# Patient Record
Sex: Female | Born: 1945
Health system: Southern US, Community
[De-identification: ages and names within clinical notes are randomized; demographics above are authoritative.]

## PROBLEM LIST (undated history)

## (undated) DIAGNOSIS — N200 Calculus of kidney: Secondary | ICD-10-CM

## (undated) DIAGNOSIS — IMO0001 Reserved for inherently not codable concepts without codable children: Secondary | ICD-10-CM

## (undated) DIAGNOSIS — G4730 Sleep apnea, unspecified: Secondary | ICD-10-CM

## (undated) DIAGNOSIS — E039 Hypothyroidism, unspecified: Secondary | ICD-10-CM

## (undated) DIAGNOSIS — I2510 Atherosclerotic heart disease of native coronary artery without angina pectoris: Secondary | ICD-10-CM

## (undated) DIAGNOSIS — E785 Hyperlipidemia, unspecified: Secondary | ICD-10-CM

## (undated) DIAGNOSIS — Z87442 Personal history of urinary calculi: Secondary | ICD-10-CM

## (undated) DIAGNOSIS — I10 Essential (primary) hypertension: Secondary | ICD-10-CM

## (undated) DIAGNOSIS — F32A Depression, unspecified: Secondary | ICD-10-CM

## (undated) DIAGNOSIS — G459 Transient cerebral ischemic attack, unspecified: Secondary | ICD-10-CM

## (undated) DIAGNOSIS — F329 Major depressive disorder, single episode, unspecified: Secondary | ICD-10-CM

## (undated) DIAGNOSIS — I209 Angina pectoris, unspecified: Secondary | ICD-10-CM

## (undated) HISTORY — DX: Reserved for inherently not codable concepts without codable children: IMO0001

## (undated) HISTORY — DX: Depression, unspecified: F32.A

## (undated) HISTORY — PX: CORONARY ANGIOPLASTY WITH STENT PLACEMENT: SHX49

## (undated) HISTORY — PX: KIDNEY STONE SURGERY: SHX686

## (undated) HISTORY — DX: Transient cerebral ischemic attack, unspecified: G45.9

## (undated) HISTORY — PX: TONSILLECTOMY: SUR1361

## (undated) HISTORY — PX: LAPAROSCOPIC CHOLECYSTECTOMY: SUR755

## (undated) HISTORY — DX: Hyperlipidemia, unspecified: E78.5

## (undated) HISTORY — PX: DILATION AND CURETTAGE OF UTERUS: SHX78

## (undated) HISTORY — DX: Essential (primary) hypertension: I10

## (undated) HISTORY — DX: Major depressive disorder, single episode, unspecified: F32.9

## (undated) HISTORY — DX: Atherosclerotic heart disease of native coronary artery without angina pectoris: I25.10

## (undated) HISTORY — DX: Hypothyroidism, unspecified: E03.9

## (undated) HISTORY — PX: APPENDECTOMY: SHX54

## (undated) HISTORY — PX: TUBAL LIGATION: SHX77

## (undated) HISTORY — PX: ABDOMINAL HYSTERECTOMY: SHX81

## (undated) HISTORY — PX: CATARACT EXTRACTION, BILATERAL: SHX1313

## (undated) HISTORY — PX: BLEPHAROPLASTY: SUR158

---

## 2001-08-24 HISTORY — PX: CORONARY ARTERY BYPASS GRAFT: SHX141

## 2014-09-04 DIAGNOSIS — M2550 Pain in unspecified joint: Secondary | ICD-10-CM | POA: Diagnosis not present

## 2014-09-04 DIAGNOSIS — R5382 Chronic fatigue, unspecified: Secondary | ICD-10-CM | POA: Diagnosis not present

## 2014-09-04 DIAGNOSIS — I119 Hypertensive heart disease without heart failure: Secondary | ICD-10-CM | POA: Diagnosis not present

## 2014-09-04 DIAGNOSIS — E784 Other hyperlipidemia: Secondary | ICD-10-CM | POA: Diagnosis not present

## 2014-09-11 DIAGNOSIS — D510 Vitamin B12 deficiency anemia due to intrinsic factor deficiency: Secondary | ICD-10-CM | POA: Diagnosis not present

## 2014-09-11 DIAGNOSIS — R5383 Other fatigue: Secondary | ICD-10-CM | POA: Diagnosis not present

## 2014-09-11 DIAGNOSIS — I110 Hypertensive heart disease with heart failure: Secondary | ICD-10-CM | POA: Diagnosis not present

## 2014-09-11 DIAGNOSIS — E784 Other hyperlipidemia: Secondary | ICD-10-CM | POA: Diagnosis not present

## 2014-09-19 DIAGNOSIS — Z8489 Family history of other specified conditions: Secondary | ICD-10-CM | POA: Diagnosis not present

## 2014-09-19 DIAGNOSIS — I358 Other nonrheumatic aortic valve disorders: Secondary | ICD-10-CM | POA: Diagnosis not present

## 2014-09-19 DIAGNOSIS — Z9862 Peripheral vascular angioplasty status: Secondary | ICD-10-CM | POA: Diagnosis not present

## 2014-09-19 DIAGNOSIS — R9439 Abnormal result of other cardiovascular function study: Secondary | ICD-10-CM | POA: Diagnosis not present

## 2014-09-19 DIAGNOSIS — R0602 Shortness of breath: Secondary | ICD-10-CM | POA: Diagnosis not present

## 2014-09-19 DIAGNOSIS — I2510 Atherosclerotic heart disease of native coronary artery without angina pectoris: Secondary | ICD-10-CM | POA: Diagnosis not present

## 2014-11-29 DIAGNOSIS — Z79899 Other long term (current) drug therapy: Secondary | ICD-10-CM | POA: Diagnosis not present

## 2014-11-29 DIAGNOSIS — I209 Angina pectoris, unspecified: Secondary | ICD-10-CM | POA: Diagnosis not present

## 2014-11-29 DIAGNOSIS — Z8673 Personal history of transient ischemic attack (TIA), and cerebral infarction without residual deficits: Secondary | ICD-10-CM | POA: Diagnosis not present

## 2014-11-29 DIAGNOSIS — Z951 Presence of aortocoronary bypass graft: Secondary | ICD-10-CM | POA: Diagnosis not present

## 2014-11-29 DIAGNOSIS — I10 Essential (primary) hypertension: Secondary | ICD-10-CM | POA: Diagnosis not present

## 2014-11-29 DIAGNOSIS — J069 Acute upper respiratory infection, unspecified: Secondary | ICD-10-CM | POA: Diagnosis not present

## 2014-11-29 DIAGNOSIS — I200 Unstable angina: Secondary | ICD-10-CM | POA: Diagnosis not present

## 2014-11-29 DIAGNOSIS — E039 Hypothyroidism, unspecified: Secondary | ICD-10-CM | POA: Diagnosis not present

## 2014-11-29 DIAGNOSIS — F329 Major depressive disorder, single episode, unspecified: Secondary | ICD-10-CM | POA: Diagnosis not present

## 2014-11-29 DIAGNOSIS — I2582 Chronic total occlusion of coronary artery: Secondary | ICD-10-CM | POA: Diagnosis not present

## 2014-11-29 DIAGNOSIS — T82857A Stenosis of cardiac prosthetic devices, implants and grafts, initial encounter: Secondary | ICD-10-CM | POA: Diagnosis not present

## 2014-11-29 DIAGNOSIS — R05 Cough: Secondary | ICD-10-CM | POA: Diagnosis not present

## 2014-11-29 DIAGNOSIS — N3281 Overactive bladder: Secondary | ICD-10-CM | POA: Diagnosis not present

## 2014-11-29 DIAGNOSIS — Z7902 Long term (current) use of antithrombotics/antiplatelets: Secondary | ICD-10-CM | POA: Diagnosis not present

## 2014-11-29 DIAGNOSIS — E782 Mixed hyperlipidemia: Secondary | ICD-10-CM | POA: Diagnosis not present

## 2014-11-29 DIAGNOSIS — I25110 Atherosclerotic heart disease of native coronary artery with unstable angina pectoris: Secondary | ICD-10-CM | POA: Diagnosis not present

## 2014-11-29 DIAGNOSIS — B9789 Other viral agents as the cause of diseases classified elsewhere: Secondary | ICD-10-CM | POA: Diagnosis not present

## 2014-11-29 DIAGNOSIS — Z7982 Long term (current) use of aspirin: Secondary | ICD-10-CM | POA: Diagnosis not present

## 2014-11-29 DIAGNOSIS — F419 Anxiety disorder, unspecified: Secondary | ICD-10-CM | POA: Diagnosis not present

## 2014-11-29 DIAGNOSIS — R079 Chest pain, unspecified: Secondary | ICD-10-CM | POA: Diagnosis not present

## 2014-11-29 DIAGNOSIS — R531 Weakness: Secondary | ICD-10-CM | POA: Diagnosis not present

## 2015-01-04 DIAGNOSIS — I2510 Atherosclerotic heart disease of native coronary artery without angina pectoris: Secondary | ICD-10-CM | POA: Diagnosis not present

## 2015-01-04 DIAGNOSIS — N390 Urinary tract infection, site not specified: Secondary | ICD-10-CM | POA: Diagnosis not present

## 2015-01-31 DIAGNOSIS — R0602 Shortness of breath: Secondary | ICD-10-CM | POA: Diagnosis not present

## 2015-01-31 DIAGNOSIS — Z8489 Family history of other specified conditions: Secondary | ICD-10-CM | POA: Diagnosis not present

## 2015-01-31 DIAGNOSIS — Z9862 Peripheral vascular angioplasty status: Secondary | ICD-10-CM | POA: Diagnosis not present

## 2015-01-31 DIAGNOSIS — R072 Precordial pain: Secondary | ICD-10-CM | POA: Diagnosis not present

## 2015-01-31 DIAGNOSIS — I2510 Atherosclerotic heart disease of native coronary artery without angina pectoris: Secondary | ICD-10-CM | POA: Diagnosis not present

## 2015-01-31 DIAGNOSIS — E782 Mixed hyperlipidemia: Secondary | ICD-10-CM | POA: Diagnosis not present

## 2015-01-31 DIAGNOSIS — I10 Essential (primary) hypertension: Secondary | ICD-10-CM | POA: Diagnosis not present

## 2015-02-01 ENCOUNTER — Other Ambulatory Visit (HOSPITAL_COMMUNITY): Payer: Self-pay | Admitting: Family Medicine

## 2015-02-01 DIAGNOSIS — Z1231 Encounter for screening mammogram for malignant neoplasm of breast: Secondary | ICD-10-CM

## 2015-02-01 DIAGNOSIS — R5382 Chronic fatigue, unspecified: Secondary | ICD-10-CM | POA: Diagnosis not present

## 2015-02-01 DIAGNOSIS — E662 Morbid (severe) obesity with alveolar hypoventilation: Secondary | ICD-10-CM | POA: Diagnosis not present

## 2015-02-01 DIAGNOSIS — I119 Hypertensive heart disease without heart failure: Secondary | ICD-10-CM | POA: Diagnosis not present

## 2015-02-01 DIAGNOSIS — E784 Other hyperlipidemia: Secondary | ICD-10-CM | POA: Diagnosis not present

## 2015-02-05 DIAGNOSIS — R350 Frequency of micturition: Secondary | ICD-10-CM | POA: Diagnosis not present

## 2015-02-05 DIAGNOSIS — E784 Other hyperlipidemia: Secondary | ICD-10-CM | POA: Diagnosis not present

## 2015-02-05 DIAGNOSIS — E1165 Type 2 diabetes mellitus with hyperglycemia: Secondary | ICD-10-CM | POA: Diagnosis not present

## 2015-02-05 DIAGNOSIS — R5383 Other fatigue: Secondary | ICD-10-CM | POA: Diagnosis not present

## 2015-02-11 ENCOUNTER — Ambulatory Visit (HOSPITAL_COMMUNITY)
Admission: RE | Admit: 2015-02-11 | Discharge: 2015-02-11 | Disposition: A | Discharge status: Home / Self Care | Payer: Medicare Other | Source: Ambulatory Visit | Attending: Family Medicine | Admitting: Family Medicine

## 2015-02-11 DIAGNOSIS — Z1231 Encounter for screening mammogram for malignant neoplasm of breast: Secondary | ICD-10-CM | POA: Diagnosis not present

## 2015-05-13 DIAGNOSIS — E785 Hyperlipidemia, unspecified: Secondary | ICD-10-CM | POA: Diagnosis not present

## 2015-05-13 DIAGNOSIS — R079 Chest pain, unspecified: Secondary | ICD-10-CM | POA: Diagnosis not present

## 2015-05-13 DIAGNOSIS — R5383 Other fatigue: Secondary | ICD-10-CM | POA: Diagnosis not present

## 2015-05-13 DIAGNOSIS — I2510 Atherosclerotic heart disease of native coronary artery without angina pectoris: Secondary | ICD-10-CM | POA: Diagnosis not present

## 2015-05-24 DIAGNOSIS — R079 Chest pain, unspecified: Secondary | ICD-10-CM | POA: Diagnosis not present

## 2015-05-24 DIAGNOSIS — R931 Abnormal findings on diagnostic imaging of heart and coronary circulation: Secondary | ICD-10-CM | POA: Diagnosis not present

## 2015-08-08 ENCOUNTER — Ambulatory Visit: Payer: Self-pay | Admitting: Family Medicine

## 2015-09-03 ENCOUNTER — Ambulatory Visit (INDEPENDENT_AMBULATORY_CARE_PROVIDER_SITE_OTHER): Payer: PPO | Admitting: Family Medicine

## 2015-09-03 ENCOUNTER — Encounter: Payer: Self-pay | Admitting: Family Medicine

## 2015-09-03 VITALS — BP 110/78 | Ht 62.0 in | Wt 214.6 lb

## 2015-09-03 DIAGNOSIS — E785 Hyperlipidemia, unspecified: Secondary | ICD-10-CM | POA: Insufficient documentation

## 2015-09-03 DIAGNOSIS — I25708 Atherosclerosis of coronary artery bypass graft(s), unspecified, with other forms of angina pectoris: Secondary | ICD-10-CM

## 2015-09-03 DIAGNOSIS — E038 Other specified hypothyroidism: Secondary | ICD-10-CM | POA: Diagnosis not present

## 2015-09-03 DIAGNOSIS — I25709 Atherosclerosis of coronary artery bypass graft(s), unspecified, with unspecified angina pectoris: Secondary | ICD-10-CM | POA: Insufficient documentation

## 2015-09-03 DIAGNOSIS — F32A Depression, unspecified: Secondary | ICD-10-CM

## 2015-09-03 DIAGNOSIS — I10 Essential (primary) hypertension: Secondary | ICD-10-CM | POA: Diagnosis not present

## 2015-09-03 DIAGNOSIS — I25812 Atherosclerosis of bypass graft of coronary artery of transplanted heart without angina pectoris: Secondary | ICD-10-CM

## 2015-09-03 DIAGNOSIS — E039 Hypothyroidism, unspecified: Secondary | ICD-10-CM | POA: Insufficient documentation

## 2015-09-03 DIAGNOSIS — I25810 Atherosclerosis of coronary artery bypass graft(s) without angina pectoris: Secondary | ICD-10-CM | POA: Insufficient documentation

## 2015-09-03 DIAGNOSIS — F329 Major depressive disorder, single episode, unspecified: Secondary | ICD-10-CM

## 2015-09-03 MED ORDER — LEVOTHYROXINE SODIUM 75 MCG PO TABS: 75.0000 ug | ORAL_TABLET | Freq: Every day | ORAL | Status: DC

## 2015-09-03 MED ORDER — SOLIFENACIN SUCCINATE 10 MG PO TABS: ORAL_TABLET | ORAL | Status: DC

## 2015-09-03 MED ORDER — PAROXETINE HCL 20 MG PO TABS: 20.0000 mg | ORAL_TABLET | Freq: Every day | ORAL | Status: DC

## 2015-09-03 NOTE — Progress Notes (Signed)
   Subjective:    Patient ID: Lacey Bailey, female    DOB: 1946-05-17, 70 y.o.   MRN: [id number]  HPI  Patient arrives to get established. Patient has a long standing heart history that includes bypass surgery and recent stent placement. Patient follows up with cardiologist on a regular basis. [facility]     Patient states she is having issues with bladder incontinence despite being on oxybutynin 5 mg 2 tablets BID and wonders about other options. Not effective anymore, mixed incontinence disorder with stress and urgency features. Dry mouth on medicine  Quadruple bupass in 2003  o thyroid med, maintained by pt faithfully.thyr stable withint he last yr No symptoms of high or low thyroid.   Also on paxil, not able to get off,hx of depresion, stable on meds, fam hx of depr, fa commited suicide. Definitely wants to stay on the medication.  Walks but not as much as she hoped  Colonoscopy tow yrs ago  mammo here.   Due to see card soon       Review of Systems no headache no chest pain no back pain no abdominal pain no change in bowel habits    Objective:   Physical Exam  alert vitals stable. Blood pressure good on repeat HEENT normal lungs clear heart rare rhythm ankles without edema mental status appropriate        Assessment & Plan:   impression #1 hypothyroidism good control per patient await records maintain same medicine for now discussed #2 depression clinically stable. Definitely wishes to [facility] meds long-term discussed #3 mixed incontinence some side effects with oxybutynin and minimal help will change medicine rationale discussed #4 coronary artery disease #5 hypertension #6 hyperlipidemia plan medications refilled. Old records. Diet exercise discussed. Recheck in 6 months. Screening well woman exams [facility]

## 2015-09-24 DIAGNOSIS — I2510 Atherosclerotic heart disease of native coronary artery without angina pectoris: Secondary | ICD-10-CM | POA: Diagnosis not present

## 2015-09-24 DIAGNOSIS — I10 Essential (primary) hypertension: Secondary | ICD-10-CM | POA: Diagnosis not present

## 2015-09-27 ENCOUNTER — Telehealth: Payer: Self-pay | Admitting: Family Medicine

## 2015-09-27 DIAGNOSIS — R32 Unspecified urinary incontinence: Secondary | ICD-10-CM

## 2015-09-27 NOTE — Telephone Encounter (Signed)
Patient has also been on oxybutin 5 mg 2 tablets twice a day

## 2015-09-27 NOTE — Telephone Encounter (Signed)
Failure on two meds, likely time to see urologis, would rec [facility]

## 2015-09-27 NOTE — Telephone Encounter (Addendum)
Patient advised that with a failure on two meds, likely time to see urologist, Dr Brett Canales would recommend [facility]. Patient verbalized understanding and would like appt in [place]. Referral placed in EPIC.

## 2015-09-27 NOTE — Telephone Encounter (Signed)
Pt called stating that the Vesicare that she was recently prescribed does not work at all. Pt wants to know if there is something else that she can try for incontinence.     [facility]

## 2015-10-01 ENCOUNTER — Encounter: Payer: Self-pay | Admitting: Family Medicine

## 2015-10-04 ENCOUNTER — Telehealth: Payer: Self-pay | Admitting: Family Medicine

## 2015-10-04 NOTE — Telephone Encounter (Signed)
solifenacin (VESICARE) 10 MG tablet  Pt would like for you to let her know if she can stop this med  Before she is seen by the urologist on 3/1   Please advise

## 2015-10-06 NOTE — Telephone Encounter (Signed)
Yes pt may stop med

## 2015-10-07 NOTE — Telephone Encounter (Signed)
LMRC

## 2015-10-08 ENCOUNTER — Ambulatory Visit (INDEPENDENT_AMBULATORY_CARE_PROVIDER_SITE_OTHER): Payer: PPO | Admitting: Family Medicine

## 2015-10-08 ENCOUNTER — Encounter: Payer: Self-pay | Admitting: Family Medicine

## 2015-10-08 VITALS — BP 124/80 | Temp 97.9°F | Ht 62.0 in | Wt 213.0 lb

## 2015-10-08 DIAGNOSIS — R55 Syncope and collapse: Secondary | ICD-10-CM

## 2015-10-08 DIAGNOSIS — I25708 Atherosclerosis of coronary artery bypass graft(s), unspecified, with other forms of angina pectoris: Secondary | ICD-10-CM

## 2015-10-08 NOTE — Telephone Encounter (Signed)
Spoke with patient and informed her per Dr.Steve Luking- Patient may stop vesicare. Patient verbalized understanding.

## 2015-10-08 NOTE — Progress Notes (Signed)
   Subjective:    Patient ID: Lacey SWARTZENTRUBER, female    DOB: Sep 07, 1945, 70 y.o.   MRN: [id number]  HPIpt was up doing stuff around the house and went to open door and passed out. Has been feeling fuzzy since. Hit right shoulder and right knee. Happened on feb 9th.   Pt had hx of "mini stroke" five yrs ago, after change in mental status  Pt was standing and walking in house  Suddenly passed out last thur while standing in the house  Lasted a matter of seconds  Then man at the door was Orland Jarred "are you ok"  Recalls no dizziness and paplpitations and passin out spells  Pt feeels fuzzy since spell occurred  Had susequent pain in leg ovdr next couple days     Review of Systems  no headache no chest pain no shortness breath no palpitations    Objective:   Physical Exam   alert vitals stable blood pressure good HEENT normal oriented 3 speech clear with lungs clear heart regular in rhythm H&T normal no focal neurological deficits   EKG nonspecific ST-T changes. Sinus bradycardia  contusion left shoulder left knee     Assessment & Plan:   impression 1 true syncope , which sounds cardiogenic in description, and patient with known coronary artery disease. Long discussion held. Happened last week. Therefore did not recommend immediate transfer to emergency room. However important to explore this. Patient ready to change cardiologist more local cardiology team plan referral to with our cardiology. Warning signs discussed maintain same meds avoid excessive exertion WSL

## 2015-10-09 ENCOUNTER — Encounter: Payer: Self-pay | Admitting: *Deleted

## 2015-10-10 ENCOUNTER — Ambulatory Visit (INDEPENDENT_AMBULATORY_CARE_PROVIDER_SITE_OTHER): Payer: PPO | Admitting: Cardiology

## 2015-10-10 ENCOUNTER — Encounter: Payer: Self-pay | Admitting: Cardiology

## 2015-10-10 VITALS — BP 122/82 | HR 61 | Ht 62.0 in | Wt 215.0 lb

## 2015-10-10 DIAGNOSIS — I10 Essential (primary) hypertension: Secondary | ICD-10-CM | POA: Diagnosis not present

## 2015-10-10 DIAGNOSIS — I25119 Atherosclerotic heart disease of native coronary artery with unspecified angina pectoris: Secondary | ICD-10-CM

## 2015-10-10 DIAGNOSIS — R55 Syncope and collapse: Secondary | ICD-10-CM | POA: Diagnosis not present

## 2015-10-10 DIAGNOSIS — E782 Mixed hyperlipidemia: Secondary | ICD-10-CM

## 2015-10-10 NOTE — Patient Instructions (Signed)
Your physician recommends that you continue on your current medications as directed. Please refer to the Current Medication list given to you today. Your physician has requested that you have an echocardiogram. Echocardiography is a painless test that uses sound waves to create images of your heart. It provides your doctor with information about the size and shape of your heart and how well your heart's chambers and valves are working. This procedure takes approximately one hour. There are no restrictions for this procedure. Your physician has recommended that you wear an event monitor. Event monitors are medical devices that record the heart's electrical activity. Doctors most often [place] these monitors to diagnose arrhythmias. Arrhythmias are problems with the speed or rhythm of the heartbeat. The monitor is a small, portable device. You can wear one while you do your normal daily activities. This is usually used to diagnose what is causing palpitations/syncope (passing out). Your physician recommends that you schedule a follow-up appointment in: 1 month.

## 2015-10-10 NOTE — Progress Notes (Signed)
Cardiology Office Note  Date: 10/10/2015   ID: Lacey Bailey, Lacey Bailey Mar 13, 1946, MRN [id number]  PCP: Lubertha South, MD  Consulting Cardiologist: Nona Dell, MD   Chief Complaint  Patient presents with  . Loss of Consciousness    History of Present Illness: Lacey Bailey is a 70 y.o. female referred for cardiology consultation by Dr. Gerda Diss. She is a complex cardiac patient followed most recently by Dr. Ivan Croft in the [place] [facility] (just seen in January 2017). I reviewed extensive records and updated her chart. She has a history of multivessel disease status post CABG in 2003 as well as prior percutaneous interventions in [place]. She has subsequently documented graft disease with occlusion of all vein grafts, patent LIMA to LAD, and is status post DES to the PLB of the RCA in April 2016 at [facility].  She is referred by Dr. Gerda Diss to discuss a recent episode of apparent syncope. She states that she was up walking from one room into the next in her home last Thursday, the doorbell rang and she went to walk toward the front door, while she was on her way she suddenly fell to the floor, was subsequently weak and assisted up after a few minutes. She does not recall feeling lightheaded or any sense of palpitations or chest pain prior to the event in question. She was tired for the rest of the day, has had no recurrent episodes since then. She states that she has had episodes of passing out before, usually felt lightheaded before these events however. It sounds like she has a fairly strong vagal reflex based on description of dropping blood pressure when she has had femoral sheaths removed in the past with previous cardiac catheterizations. She does not have any history of palpitations or cardiac arrhythmia.  She does tell me that she had medication adjustments made within the last 6 months including what sounds like a reduction in her beta blocker dose and the addition of long-acting  nitrates by her prior cardiologist. She was having fatigue and I suspect possibly symptomatic bradycardia.  Records indicate echocardiogram done at [facility] in October 2015 revealing LVEF 55-60% with grade 1 diastolic dysfunction, mild mitral rotation, and moderately sclerotic aortic valve. She has not had a more recent follow-up study. I reviewed her ECG done today which shows normal sinus rhythm with increased voltage and probable repolarization abnormalities. Intervals are otherwise normal.  Orthostatics were performed in the office today. Supine blood pressure 124 with heart rate 55, seated blood pressure 108 with heart rate 66, standing blood pressure 100 with heart rate 66, and after 3 minutes standing blood pressure 104 with heart rate 67. She had no dizziness during this time.  Past Medical History  Diagnosis Date  . Depression   . Hypothyroidism   . CAD (coronary artery disease)     Multivessel status post CABG 2003 as well as multiple percutaneous interventions Lacey Bailey [place]), DES to PLB 11/2014 ([facility])  . Essential hypertension   . Hyperlipidemia   . TIA (transient ischemic attack)   . Aortic sclerosis [facility])     Past Surgical History  Procedure Laterality Date  . Coronary artery bypass graft  2003    [place]    Current Outpatient Prescriptions  Medication Sig Dispense Refill  . amLODipine (NORVASC) 5 MG tablet Take 5 mg by mouth daily.    Marland Kitchen aspirin 81 MG tablet Take 81 mg by mouth daily.    . clopidogrel (PLAVIX) 75 MG tablet Take  75 mg by mouth daily.    Marland Kitchen co-enzyme Q-10 30 MG capsule Take 200 mg by mouth daily.    . isosorbide mononitrate (IMDUR) 30 MG 24 hr tablet Take 30 mg by mouth daily.    Marland Kitchen levothyroxine (SYNTHROID, LEVOTHROID) 75 MCG tablet Take 1 tablet (75 mcg total) by mouth daily before breakfast. 30 tablet 5  . lisinopril (PRINIVIL,ZESTRIL) 20 MG tablet Take 20 mg by mouth daily.    . metoprolol succinate (TOPROL-XL) 25 MG 24 hr tablet Take 12.5 mg by  mouth daily.    Marland Kitchen PARoxetine (PAXIL) 20 MG tablet Take 1 tablet (20 mg total) by mouth daily. 30 tablet 5  . pravastatin (PRAVACHOL) 40 MG tablet Take 40 mg by mouth daily.     No current facility-administered medications for this visit.   Allergies:  Review of patient's allergies indicates no known allergies.   Social History: The patient  reports that she has never smoked. She has never used smokeless tobacco.   Family History: The patient's family history includes Heart attack in her mother; Heart disease in her brother and mother.   ROS:  Please see the history of present illness. Otherwise, complete review of systems is positive for occasional fatigue, does not exercise regularly. Reports anxiety associated with her heart disease, also history of depression. All other systems are reviewed and negative.   Physical Exam: VS:  BP 122/82 mmHg  Pulse 61  Ht  (1.575 m)  Wt 215 lb (97.523 kg)  BMI 39.31 kg/m2  SpO2 97%, BMI Body mass index is 39.31 kg/(m^2).  Wt Readings from Last 3 Encounters:  10/10/15 215 lb (97.523 kg)  10/08/15 213 lb (96.616 kg)  09/03/15 214 lb 9.6 oz (97.342 kg)    General: Overweight woman, appears comfortable at rest. HEENT: Conjunctiva and lids normal, oropharynx clear with moist mucosa. Neck: Supple, no elevated JVP or carotid bruits, no thyromegaly. Lungs: Clear to auscultation, nonlabored breathing at rest. Cardiac: Regular rate and rhythm, no S3 or significant systolic murmur, no pericardial rub. Abdomen: Soft, nontender, bowel sounds present, no guarding or rebound. Extremities: No pitting edema, distal pulses 2+. Skin: Warm and dry. Musculoskeletal: No kyphosis. Neuropsychiatric: Alert and oriented x3, affect grossly appropriate.  ECG:  I personally reviewed the prior tracing from 05/13/2015 which showed normal sinus rhythm with R' in lead V1 and V2, nonspecific ST-T changes that are possibly repolarization abnormalities with increased  voltage.  Recent Labwork:  April 2016: Hemoglobin 11.4, platelets 190, potassium 4.0, BUN 12, creatinine 0.9, AST 22, ALT 17, cholesterol 105, triglycerides 127, HDL 30, LDL 50  Other Studies Reviewed Today:  Cardiac catheterization 11/30/2014 [facility]): 1. Left Main - no angiographic stenosis. Bifurcates distally.  2. Left anterior descending artery - occluded at the ostium. No antegrade filling of the left anterior descending artery.  a. Diagonal branches - no significant diagonal branches noted.  3. Left Circumflex - anatomically nondominant. Mild proximal irregularities. Stent in the mid circumflex. The stent is notable for 50% diffuse in-stent restenosis. Just after the stented segment of the circumflex bifurcates into 2 marginal branches.   a. Obtuse Marginal branches - the first marginal branch has moderate proximal irregularities. This vessel is tented up as result of a prior bypass graft. The bypass graft appears to be occluded. The ongoing first marginal branch has diffuse moderate   irregularities. Second marginal branch is small with diffuse mild irregularities.  4. Right Coronary Artery - anatomically dominant. Mild proximal irregularities. Stent in  the mid right coronary artery. 30% in-stent restenosis. Mid to distal right coronary artery has mild diffuse irregularities. The distal right coronary artery   bifurcates into posterior descending artery and posterolateral branch.Marland Kitchen  a. Posterior Descending Artery - mild diffuse irregularities. No focal angiographic stenosis.  b. Posterolateral Branch - proximal segment reveals mild irregularities. There is a prior stent in the proximal to midportion of the posterior lateral branch of the distal right coronary artery. The stent is patent. There is a high-grade stenosis   just beyond the stented segment has 85-90% eccentric stenosis in the mid posterior lateral branch. This branch then bifurcates into 2  subsidiary tributaries that had mild irregularities.  INTERVENTIONAL PROCEDURE SUMMARY:  The right coronary artery was engaged with the guide catheter and the guidewire was advanced across the lesion in the midsegment of the posterior lateral branch of the distal right coronary artery. The trek balloon was advanced into position across the  lesion in the mid posterior lateral branch. Inflations were performed to 6 atmospheres times 2:30-35 seconds each. A still remain a significant eccentric stenosis. The balloon was inflated to 10 atmospheres for 45 seconds of the obstructive stenosis was  relieved. Angiography revealed no evidence of dissection. The balloon was removed. A 2.5 x 16 mm Promus premier drug-eluting stent was obtained. This was inserted into the right coronary artery and placed across the mid vessel stenosis in the mid  posterior lateral branch. The stent was overlapped with the previous stent in the proximal to midportion of this branch vessel. The new stent was deployed at 14 atmospheres for 45 seconds. The stent balloon was removed. Angiography was performed with a  guidewire obtained and the guidewire removed. An excellent angiographic result was obtained. Excellent stent apposition. Stent well matched of the vessel dimension. No stent edge step up. No dissection. No intraluminal filling defects. No compromise of  tiny side branches or distal subsidiary tributaries. TIMI 3 flow maintained.  IMPRESSION:  1. Unstable angina. 2. Multivessel native coronary artery disease with prior stenting of all 3 coronary artery arteries, status post coronary artery bypass graft surgery times 4. Chronic total occlusion of native LAD at the ostium, chronic total occlusion of all 3 saphenous vein graft, patent LIMA LAD. Patent left circumflex with moderate in-stent restenosis. Patent dominant right coronary artery with patent stents in the mid  and distal vessel progressive stenosis in the posterior  lateral branch of the distal right coronary artery 5. Successful percutaneous coronary intervention of the post lateral branch of right coronary artery  Assessment and Plan:  1. Episode of syncope as outlined above, fairly sudden in onset while standing, not preceded by obvious lightheadedness, palpitations, or chest pain. She has had prior episodes of syncope, but not entirely similar to the most recent one. She may have some propensity to neurocardiogenic sensitivity, however also need to exclude arrhythmia given sudden onset of recent event. I reviewed her ECG, normal intervals present. Her current cardiac regimen looks reasonable. She was not symptomatic during orthostatics today. Plan is to obtain a 30 day event recorder and also an echocardiogram to reassess cardiac structure and function. We will have her follow-up in the office to discuss the results.  2. Multivessel CAD status post reported multiple percutaneous interventions and CABG in 2003. She has been most recently followed by the [facility] practice and underwent placement of DES to the PLB in April 2016. At that time she was documented to have occlusion of all vein grafts with patent LIMA  to LAD, patent stent site in the native circumflex. Not reporting any progressive angina symptoms at this time. I has not modified her medical regimen as yet.  3. Essential hypertension, blood pressure is well controlled today, low normal with orthostatics.  4. Hyperlipidemia, on Pravachol. She is now following with Dr. Gerda Diss.  Current medicines were reviewed with the patient today.   Orders Placed This Encounter  Procedures  . Cardiac event monitor  . EKG 12-Lead  . ECHOCARDIOGRAM COMPLETE    Disposition: FU with me in 1 month.   Signed, Jonelle Sidle, MD, [facility] 10/10/2015 2:10 PM    [facility] at [facility] [address] [place], [place], [place] [address] Phone: [phone]; Fax: [phone]

## 2015-10-12 DIAGNOSIS — R55 Syncope and collapse: Secondary | ICD-10-CM | POA: Diagnosis not present

## 2015-10-14 ENCOUNTER — Encounter (INDEPENDENT_AMBULATORY_CARE_PROVIDER_SITE_OTHER): Payer: PPO

## 2015-10-14 ENCOUNTER — Encounter: Payer: Self-pay | Admitting: Family Medicine

## 2015-10-14 DIAGNOSIS — R55 Syncope and collapse: Secondary | ICD-10-CM

## 2015-10-17 ENCOUNTER — Telehealth: Payer: Self-pay | Admitting: *Deleted

## 2015-10-17 ENCOUNTER — Other Ambulatory Visit: Payer: Self-pay

## 2015-10-17 ENCOUNTER — Ambulatory Visit (INDEPENDENT_AMBULATORY_CARE_PROVIDER_SITE_OTHER): Payer: PPO

## 2015-10-17 DIAGNOSIS — R55 Syncope and collapse: Secondary | ICD-10-CM

## 2015-10-17 NOTE — Telephone Encounter (Signed)
Patient informed. 

## 2015-10-17 NOTE — Telephone Encounter (Signed)
-----   Message from Jonelle Sidle, MD sent at 10/17/2015  1:31 PM EST ----- Reviewed study. Please let her know that LVEF is normal range, generally reassuring in the setting of syncope. We will await results of her cardiac monitor.

## 2015-10-23 ENCOUNTER — Ambulatory Visit (INDEPENDENT_AMBULATORY_CARE_PROVIDER_SITE_OTHER): Payer: PPO | Admitting: Urology

## 2015-10-23 DIAGNOSIS — R399 Unspecified symptoms and signs involving the genitourinary system: Secondary | ICD-10-CM | POA: Diagnosis not present

## 2015-10-23 DIAGNOSIS — N3281 Overactive bladder: Secondary | ICD-10-CM

## 2015-10-23 DIAGNOSIS — R32 Unspecified urinary incontinence: Secondary | ICD-10-CM

## 2015-11-07 ENCOUNTER — Telehealth: Payer: Self-pay | Admitting: *Deleted

## 2015-11-07 NOTE — Telephone Encounter (Signed)
Spoke with patient and she confirmed with nurse that she has not passed out or had any near syncope episodes since wearing heart monitor and that the last time she passed out was prior to her office visit with Diona BrownerMcDowell.

## 2015-11-07 NOTE — Telephone Encounter (Signed)
Patient returned call   She is at home

## 2015-11-08 ENCOUNTER — Ambulatory Visit: Payer: PPO | Admitting: Cardiology

## 2015-11-13 ENCOUNTER — Telehealth: Payer: Self-pay | Admitting: Cardiology

## 2015-11-13 NOTE — Telephone Encounter (Signed)
Spoke with patient and she said that she has noticed that in the last 4-5 days, she has felt more fatigue especially with activity. Patient denied increased sob, dizziness or chest pain. Patient advised that if her fatigue gets worse that she needed to go to the ED for an evaluation, otherwise that since she is scheduled for an office visit next week, that this can be discussed at that time. Patient also advised that the representative for [employer] was contacted in regards to her insurance not covering the event monitor. Patient verbalized understanding of plan.

## 2015-11-13 NOTE — Telephone Encounter (Signed)
Lacey Bailey called stating that she continues to be fatigue . Also, states that she received a letter from the insurance company stating That the insurance will not cover her monitor.

## 2015-11-13 NOTE — Telephone Encounter (Signed)
What was the indication that was placed with the monitor when ordered. She had syncope which is a clear indication for a heart monitor, I'm not sure what the insurance company has an issue with. Can we check and make sure the proper indication was reported. For her generalized fatigue she needs to discuss with pcp, she may keep her appointment with us next week   J BrancH MD

## 2015-11-15 ENCOUNTER — Telehealth: Payer: Self-pay | Admitting: *Deleted

## 2015-11-15 NOTE — Telephone Encounter (Signed)
-----   Message from Jonelle SidleSamuel G McDowell, MD sent at 11/14/2015 12:30 AM EDT ----- Reviewed. No obvious events to explain syncope.

## 2015-11-18 NOTE — Telephone Encounter (Signed)
Patient informed. 

## 2015-11-20 ENCOUNTER — Ambulatory Visit (HOSPITAL_COMMUNITY)
Admission: RE | Admit: 2015-11-20 | Discharge: 2015-11-20 | Disposition: A | Discharge status: Home / Self Care | Payer: PPO | Source: Ambulatory Visit | Attending: Cardiology | Admitting: Cardiology

## 2015-11-20 ENCOUNTER — Encounter: Payer: Self-pay | Admitting: Cardiology

## 2015-11-20 ENCOUNTER — Other Ambulatory Visit: Payer: Self-pay | Admitting: Cardiology

## 2015-11-20 ENCOUNTER — Ambulatory Visit (INDEPENDENT_AMBULATORY_CARE_PROVIDER_SITE_OTHER): Payer: PPO | Admitting: Cardiology

## 2015-11-20 VITALS — BP 100/60 | HR 74 | Ht 63.0 in | Wt 219.0 lb

## 2015-11-20 DIAGNOSIS — R55 Syncope and collapse: Secondary | ICD-10-CM

## 2015-11-20 DIAGNOSIS — I200 Unstable angina: Secondary | ICD-10-CM

## 2015-11-20 DIAGNOSIS — Z01810 Encounter for preprocedural cardiovascular examination: Secondary | ICD-10-CM | POA: Diagnosis not present

## 2015-11-20 DIAGNOSIS — Z951 Presence of aortocoronary bypass graft: Secondary | ICD-10-CM | POA: Diagnosis not present

## 2015-11-20 DIAGNOSIS — Z01818 Encounter for other preprocedural examination: Secondary | ICD-10-CM | POA: Diagnosis not present

## 2015-11-20 DIAGNOSIS — E782 Mixed hyperlipidemia: Secondary | ICD-10-CM

## 2015-11-20 DIAGNOSIS — I25119 Atherosclerotic heart disease of native coronary artery with unspecified angina pectoris: Secondary | ICD-10-CM

## 2015-11-20 DIAGNOSIS — I10 Essential (primary) hypertension: Secondary | ICD-10-CM

## 2015-11-20 NOTE — Progress Notes (Signed)
Cardiology Office Note  Date: 11/20/2015   ID: Lacey SinghJoyce H Bailey, DOB 04/18/1946, MRN [id number]  PCP: Lubertha SouthSteve Luking, MD  Primary Cardiologist: Nona DellSamuel Ulysess Witz, MD   Chief Complaint  Patient presents with  . Follow-up testing  . Progressive chest pain    History of Present Illness: Lacey Bailey is a 70 y.o. female seen by me for the first time in February of this year, a former patient of Dr. Ivan CroftMcCabe with the [facility]. She was referred for a cardiac monitor and echocardiogram for further investigation of syncope. Echocardiogram showed normal LVEF without major valvular abnormalities to explain syncope. Full report is noted below. Her 30 day cardiac monitor showed sinus rhythm throughout with only rare PACs and brief atrial runs. Episode of reported syncope during monitoring did not correspond with any specific arrhythmias or pauses. I went over the results with her today.  She presents with her husband. She tells me that over the last week, unrelated to the prior episodes of syncope, she has been experiencing progressive exertional fatigue and shortness of breath, increased need to rest and sleep, also progressing episodes of chest pain consistent with her angina and requiring nitroglycerin. She has had grandchildren visiting, was hoping to be active with them, however has been very limited with these symptoms including even just basic ADLs.  She states that she has been taking her medications regularly. She almost went to the hospital with her most recent episode of chest pain due to intensity and duration.  She has a history of multivessel CAD status post CABG in 2003 as well [place] and most recently DES to the posterolateral branch in [facility]. She has occlusion of all vein grafts, patency of the LIMA to LAD, and also had moderate in-stent restenosis within the circumflex at the time of her most recent  intervention.  Past Medical History  Diagnosis Date  . Depression   . Hypothyroidism   . CAD (coronary artery disease)     Multivessel status post CABG 2003 as well as multiple percutaneous interventions Sharma Covert([place]), DES to PLB 11/2014 ([facility])  . Essential hypertension   . Hyperlipidemia   . TIA (transient ischemic attack)   . Aortic sclerosis [facility])     Past Surgical History  Procedure Laterality Date  . Coronary artery bypass graft  2003    [place]    Current Outpatient Prescriptions  Medication Sig Dispense Refill  . amLODipine (NORVASC) 5 MG tablet Take 5 mg by mouth daily.    Marland Kitchen. aspirin 81 MG tablet Take 81 mg by mouth daily.    . clopidogrel (PLAVIX) 75 MG tablet Take 75 mg by mouth daily.    Marland Kitchen. co-enzyme Q-10 30 MG capsule Take 200 mg by mouth daily.    . isosorbide mononitrate (IMDUR) 30 MG 24 hr tablet Take 30 mg by mouth daily.    Marland Kitchen. levothyroxine (SYNTHROID, LEVOTHROID) 75 MCG tablet Take 1 tablet (75 mcg total) by mouth daily before breakfast. 30 tablet 5  . lisinopril (PRINIVIL,ZESTRIL) 20 MG tablet Take 20 mg by mouth daily.    . metoprolol succinate (TOPROL-XL) 25 MG 24 hr tablet Take 12.5 mg by mouth daily.    Marland Kitchen. PARoxetine (PAXIL) 20 MG tablet Take 1 tablet (20 mg total) by mouth daily. 30 tablet 5  . pravastatin (PRAVACHOL) 40 MG tablet Take 40 mg by mouth daily.     No current facility-administered medications for this visit.  Allergies:  Review of patient's allergies indicates no known allergies.   Social History: The patient  reports that she has never smoked. She has never used smokeless tobacco.   Family History: The patient's family history includes Heart attack in her mother; Heart disease in her brother and mother.   ROS:  Please see the history of present illness. Otherwise, complete review of systems is positive for fatigue and anxiety.  All other systems are reviewed and negative.   Physical Exam: VS:  BP 100/60 mmHg  Pulse 74  Ht   (1.6 m)  Wt 219 lb (99.338 kg)  BMI 38.80 kg/m2  SpO2 98%, BMI Body mass index is 38.8 kg/(m^2).  Wt Readings from Last 3 Encounters:  11/20/15 219 lb (99.338 kg)  10/10/15 215 lb (97.523 kg)  10/08/15 213 lb (96.616 kg)    General: Patient appears comfortable at rest. HEENT: Conjunctiva and lids normal, oropharynx clear with moist mucosa. Neck: Supple, no elevated JVP or carotid bruits, no thyromegaly. Lungs: Clear to auscultation, nonlabored breathing at rest. Cardiac: Regular rate and rhythm, no S3 or significant systolic murmur, no pericardial rub. Abdomen: Soft, nontender, no hepatomegaly, bowel sounds present, no guarding or rebound. Extremities: No pitting edema, distal pulses 2+. Skin: Warm and dry. Musculoskeletal: No kyphosis. Neuropsychiatric: Alert and oriented x3, affect grossly appropriate.  ECG: I personally reviewed the prior tracing from 10/10/2015 which showed sinus rhythm with increased voltage and repolarization abnormalities.  Recent Labwork:  April 2016: Hemoglobin 11.4, platelets 190, potassium 4.0, BUN 12, creatinine 0.9, AST 22, ALT 17, cholesterol 105, triglycerides 127, HDL 30, LDL 50  Other Studies Reviewed Today:  Echocardiogram 10/17/2015: Study Conclusions  - Left ventricle: The cavity size was normal. Wall thickness was  increased in a pattern of mild LVH. Systolic function was  vigorous. The estimated ejection fraction was in the range of 65%  to 70%. Wall motion was normal; there were no regional wall  motion abnormalities. - Aortic valve: Trileaflet; mildly calcified leaflets. Moderate  calcification involving the noncoronary cusp. - Mitral valve: Calcified annulus. There was trivial regurgitation. - Right atrium: Central venous pressure (est): 3 mm Hg. - Tricuspid valve: There was trivial regurgitation. - Pulmonary arteries: PA peak pressure: 35 mm Hg (S). - Pericardium, extracardiac: There was no pericardial  effusion.  Impressions:  - Mild LVH with LVEF 65-70%. Grade 1 diastolic dysfunction with  equivocal LV filling pressure. MAC with trivial mitral  regurgitation. Moderately sclerotic aortic valve without  stenosis. Trivial tricuspid regurgitation with PASP estimated 35  mmHg.  Cardiac catheterization 11/30/2014 [facility]): 1. Left Main - no angiographic stenosis. Bifurcates distally.  2. Left anterior descending artery - occluded at the ostium. No antegrade filling of the left anterior descending artery.  a. Diagonal branches - no significant diagonal branches noted.  3. Left Circumflex - anatomically nondominant. Mild proximal irregularities. Stent in the mid circumflex. The stent is notable for 50% diffuse in-stent restenosis. Just after the stented segment of the circumflex bifurcates into 2 marginal branches.   a. Obtuse Marginal branches - the first marginal branch has moderate proximal irregularities. This vessel is tented up as result of a prior bypass graft. The bypass graft appears to be occluded. The ongoing first marginal branch has diffuse moderate   irregularities. Second marginal branch is small with diffuse mild irregularities.  4. Right Coronary Artery - anatomically dominant. Mild proximal irregularities. Stent in the mid right coronary artery. 30% in-stent restenosis. Mid to distal right coronary artery  has mild diffuse irregularities. The distal right coronary artery   bifurcates into posterior descending artery and posterolateral branch.Marland Kitchen  a. Posterior Descending Artery - mild diffuse irregularities. No focal angiographic stenosis.  b. Posterolateral Branch - proximal segment reveals mild irregularities. There is a prior stent in the proximal to midportion of the posterior lateral branch of the distal right coronary artery. The stent is patent. There is a high-grade stenosis   just beyond the stented segment has 85-90% eccentric  stenosis in the mid posterior lateral branch. This branch then bifurcates into 2 subsidiary tributaries that had mild irregularities.  INTERVENTIONAL PROCEDURE SUMMARY:  The right coronary artery was engaged with the guide catheter and the guidewire was advanced across the lesion in the midsegment of the posterior lateral branch of the distal right coronary artery. The trek balloon was advanced into position across the  lesion in the mid posterior lateral branch. Inflations were performed to 6 atmospheres times 2:30-35 seconds each. A still remain a significant eccentric stenosis. The balloon was inflated to 10 atmospheres for 45 seconds of the obstructive stenosis was  relieved. Angiography revealed no evidence of dissection. The balloon was removed. A 2.5 x 16 mm Promus premier drug-eluting stent was obtained. This was inserted into the right coronary artery and placed across the mid vessel stenosis in the mid  posterior lateral branch. The stent was overlapped with the previous stent in the proximal to midportion of this branch vessel. The new stent was deployed at 14 atmospheres for 45 seconds. The stent balloon was removed. Angiography was performed with a  guidewire obtained and the guidewire removed. An excellent angiographic result was obtained. Excellent stent apposition. Stent well matched of the vessel dimension. No stent edge step up. No dissection. No intraluminal filling defects. No compromise of  tiny side branches or distal subsidiary tributaries. TIMI 3 flow maintained.  IMPRESSION:  1. Unstable angina. 2. Multivessel native coronary artery disease with prior stenting of all 3 coronary artery arteries, status post coronary artery bypass graft surgery times 4. Chronic total occlusion of native LAD at the ostium, chronic total occlusion of all 3 saphenous vein graft, patent LIMA LAD. Patent left circumflex with moderate in-stent restenosis. Patent dominant right coronary artery with  patent stents in the mid  and distal vessel progressive stenosis in the posterior lateral branch of the distal right coronary artery 5. Successful percutaneous coronary intervention of the post lateral branch of right coronary artery  Assessment and Plan:  1. Worsening exertional fatigue and dyspnea with accelerating angina within the last week on stable medical regimen and requiring additional nitroglycerin. Patient has a history of multivessel CAD with prior CABG and multiple interventions including more recently DES to the posterolateral branch in April of last year at [facility]. She had documentation of occlusion of all vein grafts at that time with patency of the LIMA to LAD and moderate in-stent restenosis in the circumflex. In light of her progressing symptoms we have discussed scheduling a cardiac catheterization to reevaluate coronary anatomy and determine if there are any significant revscularization options to consider. She is in agreement to proceed.  2. Essential hypertension, blood pressure low normal today.  3. History of syncope, at this point suspect perhaps prominent vagal tone as a contributor. She has normal LVEF by recent echocardiogram and her 30 day cardiac monitor did not demonstrate any significant arrhythmias that would explain her symptoms.  4. Hyperlipidemia, on Pravachol. Last LDL was 50.  Current medicines were reviewed with  the patient today.   Orders Placed This Encounter  Procedures  . DG Chest 2 View  . INR/PT  . CBC with Differential  . Basic Metabolic Panel (BMET)    Disposition: FU with me after cardiac catheterization.   Signed, Jonelle Sidle, MD, [facility] 11/20/2015 4:50 PM    [facility] at [facility] [address] [place], [place] [address] Phone: [phone]; Fax: [phone]

## 2015-11-20 NOTE — Patient Instructions (Signed)
Medication Instructions:  Your physician recommends that you continue on your current medications as directed. Please refer to the Current Medication list given to you today.   Labwork: Your physician recommends that you return for lab work in:DAY OF PROCEDURE bmet Cbc inr   Testing/Procedures: Your physician has requested that you have a cardiac catheterization. Cardiac catheterization is used to diagnose and/or treat various heart conditions. Doctors may recommend this procedure for a number of different reasons. The most common reason is to evaluate chest pain. Chest pain can be a symptom of coronary artery disease (CAD), and cardiac catheterization can show whether plaque is narrowing or blocking your heart's arteries. This procedure is also used to evaluate the valves, as well as measure the blood flow and oxygen levels in different parts of your heart. For further information please visit [email or web address]. Please follow instruction sheet, as given.    Follow-Up:  Your physician wants you to follow-up in: 1 YEAR  You will receive a reminder letter in the mail two months in advance. If you don't receive a letter, please call our office to schedule the follow-up appointment.   Any Other Special Instructions Will Be Listed Below (If Applicable).     If you need a refill on your cardiac medications before your next appointment, please call your pharmacy.

## 2015-11-21 ENCOUNTER — Ambulatory Visit: Payer: PPO | Admitting: Cardiology

## 2015-11-22 ENCOUNTER — Encounter (HOSPITAL_COMMUNITY): Payer: Self-pay | Admitting: General Practice

## 2015-11-22 ENCOUNTER — Ambulatory Visit (HOSPITAL_COMMUNITY)
Admission: RE | Admit: 2015-11-22 | Discharge: 2015-11-23 | Disposition: A | Discharge status: Home / Self Care | Payer: PPO | Source: Ambulatory Visit | Attending: Interventional Cardiology | Admitting: Interventional Cardiology

## 2015-11-22 ENCOUNTER — Encounter (HOSPITAL_COMMUNITY)
Admission: RE | Disposition: A | Discharge status: Home / Self Care | Payer: Self-pay | Source: Ambulatory Visit | Attending: Interventional Cardiology

## 2015-11-22 DIAGNOSIS — I2510 Atherosclerotic heart disease of native coronary artery without angina pectoris: Secondary | ICD-10-CM | POA: Diagnosis not present

## 2015-11-22 DIAGNOSIS — I10 Essential (primary) hypertension: Secondary | ICD-10-CM | POA: Diagnosis present

## 2015-11-22 DIAGNOSIS — E039 Hypothyroidism, unspecified: Secondary | ICD-10-CM | POA: Insufficient documentation

## 2015-11-22 DIAGNOSIS — Z7902 Long term (current) use of antithrombotics/antiplatelets: Secondary | ICD-10-CM | POA: Diagnosis not present

## 2015-11-22 DIAGNOSIS — E785 Hyperlipidemia, unspecified: Secondary | ICD-10-CM | POA: Diagnosis present

## 2015-11-22 DIAGNOSIS — I25709 Atherosclerosis of coronary artery bypass graft(s), unspecified, with unspecified angina pectoris: Secondary | ICD-10-CM | POA: Diagnosis present

## 2015-11-22 DIAGNOSIS — Z7982 Long term (current) use of aspirin: Secondary | ICD-10-CM | POA: Insufficient documentation

## 2015-11-22 DIAGNOSIS — I2582 Chronic total occlusion of coronary artery: Secondary | ICD-10-CM | POA: Diagnosis not present

## 2015-11-22 DIAGNOSIS — I25110 Atherosclerotic heart disease of native coronary artery with unstable angina pectoris: Secondary | ICD-10-CM | POA: Insufficient documentation

## 2015-11-22 DIAGNOSIS — T82855A Stenosis of coronary artery stent, initial encounter: Secondary | ICD-10-CM | POA: Insufficient documentation

## 2015-11-22 DIAGNOSIS — I25710 Atherosclerosis of autologous vein coronary artery bypass graft(s) with unstable angina pectoris: Secondary | ICD-10-CM | POA: Diagnosis not present

## 2015-11-22 DIAGNOSIS — Z8673 Personal history of transient ischemic attack (TIA), and cerebral infarction without residual deficits: Secondary | ICD-10-CM | POA: Insufficient documentation

## 2015-11-22 DIAGNOSIS — I200 Unstable angina: Secondary | ICD-10-CM | POA: Diagnosis present

## 2015-11-22 DIAGNOSIS — Z9861 Coronary angioplasty status: Secondary | ICD-10-CM

## 2015-11-22 DIAGNOSIS — Y838 Other surgical procedures as the cause of abnormal reaction of the patient, or of later complication, without mention of misadventure at the time of the procedure: Secondary | ICD-10-CM | POA: Insufficient documentation

## 2015-11-22 HISTORY — PX: CARDIAC CATHETERIZATION: SHX172

## 2015-11-22 HISTORY — DX: Angina pectoris, unspecified: I20.9

## 2015-11-22 HISTORY — DX: Calculus of kidney: N20.0

## 2015-11-22 LAB — BASIC METABOLIC PANEL
Anion gap: 9 (ref 5–15)
BUN: 15 mg/dL (ref 6–20)
CHLORIDE: 105 mmol/L (ref 101–111)
CO2: 26 mmol/L (ref 22–32)
CREATININE: 1.33 mg/dL — AB (ref 0.44–1.00)
Calcium: 9.3 mg/dL (ref 8.9–10.3)
GFR calc Af Amer: 45 mL/min — ABNORMAL LOW (ref >60)
GFR calc non Af Amer: 39 mL/min — ABNORMAL LOW (ref >60)
Glucose, Bld: 98 mg/dL (ref 65–99)
Potassium: 4.3 mmol/L (ref 3.5–5.1)
SODIUM: 140 mmol/L (ref 135–145)

## 2015-11-22 LAB — CBC
HCT: 33.2 % — ABNORMAL LOW (ref 36.0–46.0)
Hemoglobin: 11.4 g/dL — ABNORMAL LOW (ref 12.0–15.0)
MCH: 30.4 pg (ref 26.0–34.0)
MCHC: 34.3 g/dL (ref 30.0–36.0)
MCV: 88.5 fL (ref 78.0–100.0)
PLATELETS: 291 10*3/uL (ref 150–400)
RBC: 3.75 MIL/uL — ABNORMAL LOW (ref 3.87–5.11)
RDW: 13.9 % (ref 11.5–15.5)
WBC: 8.3 10*3/uL (ref 4.0–10.5)

## 2015-11-22 LAB — POCT ACTIVATED CLOTTING TIME: Activated Clotting Time: 358 seconds

## 2015-11-22 LAB — PROTIME-INR
INR: 0.97 (ref 0.00–1.49)
Prothrombin Time: 13.1 seconds (ref 11.6–15.2)

## 2015-11-22 SURGERY — LEFT HEART CATH AND CORS/GRAFTS ANGIOGRAPHY

## 2015-11-22 MED ORDER — IOPAMIDOL (ISOVUE-370) INJECTION 76%
INTRAVENOUS | Status: AC
  Filled 2015-11-22: qty 100

## 2015-11-22 MED ORDER — ACETAMINOPHEN 325 MG PO TABS: 650.0000 mg | ORAL_TABLET | ORAL | Status: DC | PRN

## 2015-11-22 MED ORDER — SODIUM CHLORIDE 0.9% FLUSH: 3.0000 mL | Freq: Two times a day (BID) | INTRAVENOUS | Status: DC

## 2015-11-22 MED ORDER — ANGIOPLASTY BOOK
Freq: Once | Status: AC
  Administered 2015-11-22: 22:00:00
  Filled 2015-11-22: qty 1

## 2015-11-22 MED ORDER — LIDOCAINE-EPINEPHRINE 1 %-1:100000 IJ SOLN
30.0000 mL | Freq: Once | INTRAMUSCULAR | Status: AC
  Administered 2015-11-22 (×2): 1 mL

## 2015-11-22 MED ORDER — METOPROLOL SUCCINATE ER 25 MG PO TB24
12.5000 mg | ORAL_TABLET | Freq: Every day | ORAL | Status: DC
  Filled 2015-11-22: qty 1

## 2015-11-22 MED ORDER — CLOPIDOGREL BISULFATE 75 MG PO TABS
75.0000 mg | ORAL_TABLET | Freq: Every day | ORAL | Status: DC
  Administered 2015-11-23: 75 mg via ORAL
  Filled 2015-11-22: qty 1

## 2015-11-22 MED ORDER — VERAPAMIL HCL 2.5 MG/ML IV SOLN
INTRAVENOUS | Status: AC
  Filled 2015-11-22: qty 2

## 2015-11-22 MED ORDER — TICAGRELOR 90 MG PO TABS
ORAL_TABLET | ORAL | Status: AC
  Filled 2015-11-22: qty 1

## 2015-11-22 MED ORDER — HEPARIN SODIUM (PORCINE) 1000 UNIT/ML IJ SOLN
INTRAMUSCULAR | Status: AC
  Filled 2015-11-22: qty 1

## 2015-11-22 MED ORDER — TICAGRELOR 90 MG PO TABS
ORAL_TABLET | ORAL | Status: DC | PRN
  Administered 2015-11-22: 180 mg via ORAL

## 2015-11-22 MED ORDER — PAROXETINE HCL 20 MG PO TABS
20.0000 mg | ORAL_TABLET | Freq: Every day | ORAL | Status: DC
  Administered 2015-11-23: 09:00:00 20 mg via ORAL
  Filled 2015-11-22: qty 1

## 2015-11-22 MED ORDER — SODIUM CHLORIDE 0.9 % IV SOLN: 250.0000 mL | INTRAVENOUS | Status: DC | PRN

## 2015-11-22 MED ORDER — NITROGLYCERIN 1 MG/10 ML FOR IR/CATH LAB
INTRA_ARTERIAL | Status: AC
  Filled 2015-11-22: qty 10

## 2015-11-22 MED ORDER — ASPIRIN 81 MG PO CHEW: 81.0000 mg | CHEWABLE_TABLET | ORAL | Status: DC

## 2015-11-22 MED ORDER — MIRABEGRON ER 50 MG PO TB24: 50.0000 mg | ORAL_TABLET | Freq: Every day | ORAL | Status: DC

## 2015-11-22 MED ORDER — LEVOTHYROXINE SODIUM 75 MCG PO TABS
75.0000 ug | ORAL_TABLET | Freq: Every day | ORAL | Status: DC
  Administered 2015-11-23: 07:00:00 75 ug via ORAL
  Filled 2015-11-22: qty 1

## 2015-11-22 MED ORDER — MORPHINE SULFATE (PF) 2 MG/ML IV SOLN: 2.0000 mg | INTRAVENOUS | Status: DC | PRN

## 2015-11-22 MED ORDER — ASPIRIN EC 81 MG PO TBEC
81.0000 mg | DELAYED_RELEASE_TABLET | Freq: Every day | ORAL | Status: DC
  Administered 2015-11-23: 81 mg via ORAL
  Filled 2015-11-22: qty 1

## 2015-11-22 MED ORDER — MIDAZOLAM HCL 2 MG/2ML IJ SOLN
INTRAMUSCULAR | Status: DC | PRN
  Administered 2015-11-22: 1 mg via INTRAVENOUS
  Administered 2015-11-22: 2 mg via INTRAVENOUS
  Administered 2015-11-22: 1 mg via INTRAVENOUS

## 2015-11-22 MED ORDER — AMLODIPINE BESYLATE 5 MG PO TABS
5.0000 mg | ORAL_TABLET | Freq: Every day | ORAL | Status: DC
  Administered 2015-11-23: 5 mg via ORAL
  Filled 2015-11-22: qty 1

## 2015-11-22 MED ORDER — MIDAZOLAM HCL 2 MG/2ML IJ SOLN
INTRAMUSCULAR | Status: AC
Start: 2015-11-22 — End: 2015-11-22
  Filled 2015-11-22: qty 2

## 2015-11-22 MED ORDER — ISOSORBIDE MONONITRATE ER 30 MG PO TB24
30.0000 mg | ORAL_TABLET | Freq: Every day | ORAL | Status: DC
  Administered 2015-11-23: 30 mg via ORAL
  Filled 2015-11-22: qty 1

## 2015-11-22 MED ORDER — BIVALIRUDIN 250 MG IV SOLR
INTRAVENOUS | Status: AC
  Filled 2015-11-22: qty 250

## 2015-11-22 MED ORDER — DIPHENHYDRAMINE-APAP (SLEEP) 25-500 MG PO TABS: 1.0000 | ORAL_TABLET | Freq: Every evening | ORAL | Status: DC | PRN

## 2015-11-22 MED ORDER — HEPARIN (PORCINE) IN NACL 2-0.9 UNIT/ML-% IJ SOLN
INTRAMUSCULAR | Status: AC
  Filled 2015-11-22: qty 500

## 2015-11-22 MED ORDER — LIDOCAINE HCL (PF) 1 % IJ SOLN
INTRAMUSCULAR | Status: DC | PRN
  Administered 2015-11-22: 15 mL
  Administered 2015-11-22: 6 mL

## 2015-11-22 MED ORDER — BIVALIRUDIN BOLUS VIA INFUSION - CUPID
INTRAVENOUS | Status: DC | PRN
  Administered 2015-11-22: 73.125 mg via INTRAVENOUS

## 2015-11-22 MED ORDER — PRAVASTATIN SODIUM 40 MG PO TABS
40.0000 mg | ORAL_TABLET | Freq: Every day | ORAL | Status: DC
  Administered 2015-11-22: 22:00:00 40 mg via ORAL
  Filled 2015-11-22: qty 1

## 2015-11-22 MED ORDER — COENZYME Q10 30 MG PO CAPS: 200.0000 mg | ORAL_CAPSULE | Freq: Every day | ORAL | Status: DC

## 2015-11-22 MED ORDER — MIRABEGRON ER 50 MG PO TB24
50.0000 mg | ORAL_TABLET | Freq: Every day | ORAL | Status: DC
  Administered 2015-11-22: 50 mg via ORAL
  Filled 2015-11-22: qty 1

## 2015-11-22 MED ORDER — SODIUM CHLORIDE 0.9 % IV SOLN
250.0000 mg | INTRAVENOUS | Status: DC | PRN
  Administered 2015-11-22: 1.75 mg/kg/h via INTRAVENOUS

## 2015-11-22 MED ORDER — HEPARIN (PORCINE) IN NACL 2-0.9 UNIT/ML-% IJ SOLN: INTRAMUSCULAR | Status: DC | PRN

## 2015-11-22 MED ORDER — HEPARIN (PORCINE) IN NACL 2-0.9 UNIT/ML-% IJ SOLN
INTRAMUSCULAR | Status: DC | PRN
  Administered 2015-11-22: 1500 mL

## 2015-11-22 MED ORDER — HEPARIN (PORCINE) IN NACL 2-0.9 UNIT/ML-% IJ SOLN
INTRAMUSCULAR | Status: AC
  Filled 2015-11-22: qty 1000

## 2015-11-22 MED ORDER — NITROGLYCERIN 1 MG/10 ML FOR IR/CATH LAB
INTRA_ARTERIAL | Status: DC | PRN
  Administered 2015-11-22: 150 ug via INTRACORONARY
  Administered 2015-11-22: 200 ug via INTRACORONARY

## 2015-11-22 MED ORDER — SODIUM CHLORIDE 0.9% FLUSH: 3.0000 mL | INTRAVENOUS | Status: DC | PRN

## 2015-11-22 MED ORDER — IOPAMIDOL (ISOVUE-370) INJECTION 76%
INTRAVENOUS | Status: DC | PRN
  Administered 2015-11-22: 260 mL via INTRA_ARTERIAL

## 2015-11-22 MED ORDER — LIDOCAINE-EPINEPHRINE 1 %-1:100000 IJ SOLN
INTRAMUSCULAR | Status: AC
  Filled 2015-11-22: qty 1

## 2015-11-22 MED ORDER — LIDOCAINE HCL (PF) 1 % IJ SOLN
INTRAMUSCULAR | Status: AC
  Filled 2015-11-22: qty 30

## 2015-11-22 MED ORDER — SODIUM CHLORIDE 0.9 % WEIGHT BASED INFUSION
3.0000 mL/kg/h | INTRAVENOUS | Status: AC
  Administered 2015-11-22: 3 mL/kg/h via INTRAVENOUS

## 2015-11-22 MED ORDER — METOPROLOL SUCCINATE ER 25 MG PO TB24
12.5000 mg | ORAL_TABLET | Freq: Every day | ORAL | Status: DC
  Administered 2015-11-22: 22:00:00 12.5 mg via ORAL

## 2015-11-22 MED ORDER — MIDAZOLAM HCL 2 MG/2ML IJ SOLN
INTRAMUSCULAR | Status: AC
  Filled 2015-11-22: qty 2

## 2015-11-22 MED ORDER — ONDANSETRON HCL 4 MG/2ML IJ SOLN: 4.0000 mg | Freq: Four times a day (QID) | INTRAMUSCULAR | Status: DC | PRN

## 2015-11-22 MED ORDER — FENTANYL CITRATE (PF) 100 MCG/2ML IJ SOLN
INTRAMUSCULAR | Status: DC | PRN
  Administered 2015-11-22 (×4): 25 ug via INTRAVENOUS

## 2015-11-22 MED ORDER — FENTANYL CITRATE (PF) 100 MCG/2ML IJ SOLN
INTRAMUSCULAR | Status: AC
  Filled 2015-11-22: qty 2

## 2015-11-22 MED ORDER — SODIUM CHLORIDE 0.9 % IV SOLN
INTRAVENOUS | Status: DC
  Administered 2015-11-22: 09:00:00 via INTRAVENOUS

## 2015-11-22 MED ORDER — LISINOPRIL 10 MG PO TABS
20.0000 mg | ORAL_TABLET | Freq: Two times a day (BID) | ORAL | Status: DC
  Administered 2015-11-22 – 2015-11-23 (×2): 20 mg via ORAL
  Filled 2015-11-22 (×2): qty 2

## 2015-11-22 SURGICAL SUPPLY — 28 items
BALLN ANGIOSCULPT RX 3.0X10 (BALLOONS) ×3
BALLN EMERGE MR 2.5X12 (BALLOONS) ×3
BALLN TREK RX 2.5X20 (BALLOONS) ×3
BALLOON ANGIOSCULPT RX 3.0X10 (BALLOONS) ×1 IMPLANT
BALLOON EMERGE MR 2.5X12 (BALLOONS) ×1 IMPLANT
BALLOON TREK RX 2.5X20 (BALLOONS) ×1 IMPLANT
CATH INFINITI 5FR MULTPACK ANG (CATHETERS) ×3 IMPLANT
CATH VISTA GUIDE 6FR XB3.5 (CATHETERS) ×3 IMPLANT
CATH VISTA GUIDE 6FR XBRCA (CATHETERS) ×3 IMPLANT
COVER PRB 48X5XTLSCP FOLD TPE (BAG) ×2 IMPLANT
COVER PROBE 5X48 (BAG) ×4
CUTTING BAL FLEXTOME RX 2.5X15 (BALLOONS) ×3 IMPLANT
DEVICE RAD COMP TR BAND LRG (VASCULAR PRODUCTS) ×6 IMPLANT
GLIDESHEATH SLEND A-KIT 6F 22G (SHEATH) ×3 IMPLANT
GLIDESHEATH SLEND SS 6F .021 (SHEATH) ×3 IMPLANT
KIT ENCORE 26 ADVANTAGE (KITS) ×3 IMPLANT
KIT HEART LEFT (KITS) ×3 IMPLANT
PACK CARDIAC CATHETERIZATION (CUSTOM PROCEDURE TRAY) ×3 IMPLANT
SHEATH PINNACLE 5F 10CM (SHEATH) ×3 IMPLANT
SHEATH PINNACLE 6F 10CM (SHEATH) ×3 IMPLANT
SYR MEDRAD MARK V 150ML (SYRINGE) ×3 IMPLANT
TRANSDUCER W/STOPCOCK (MISCELLANEOUS) ×3 IMPLANT
TUBING CIL FLEX 10 FLL-RA (TUBING) ×3 IMPLANT
WIRE EMERALD 3MM-J .035X150CM (WIRE) ×3 IMPLANT
WIRE HI TORQ BMW 190CM (WIRE) ×3 IMPLANT
WIRE HI TORQ VERSACORE-J 145CM (WIRE) ×3 IMPLANT
WIRE MICROINTRODUCER 60CM (WIRE) ×3 IMPLANT
WIRE SAFE-T 1.5MM-J .035X260CM (WIRE) ×3 IMPLANT

## 2015-11-22 NOTE — Progress Notes (Signed)
Site area: RFA Site Prior to Removal:  Level 0 Pressure Applied For:2 hr 5 min Manual:   yes Patient Status During Pull: stable  Post Pull Site:  Level 0 Post Pull Instructions Given: yes  Post Pull Pulses Present: palpable Dressing Applied: tegaderm  Bedrest begins @  1645 till [id number] Comments:1cc lido /epi injected x 2 at site

## 2015-11-22 NOTE — Interval H&P Note (Signed)
History and Physical Interval Note:  11/22/2015 10:40 AM  Lacey Bailey  has presented today for surgery, with the diagnosis of usa/accelerated angina.  The various methods of treatment have been discussed with the patient and family. After consideration of risks, benefits and other options for treatment, the patient has consented to  Procedure(s): Left Heart Cath and Coronary Angiography (N/A) with Graft Angiography, and Possible Percutaneous Coronary Interventio n as a surgical intervention .  The patient's history has been reviewed, patient examined, no change in status, stable for surgery.  I have reviewed the patient's chart and labs.  Questions were answered to the patient's satisfaction.    Cath Lab Visit (complete for each Cath Lab visit)  Clinical Evaluation Leading to the Procedure:   ACS: Yes.    Non-ACS:    Anginal Classification: CCS IV  Anti-ischemic medical therapy: Maximal Therapy (2 or more classes of medications)  Non-Invasive Test Results: No non-invasive testing performed  Prior CABG: Previous CABG  TIMI SCORE  Patient Information:  TIMI Score is 4  UA/NSTEMI and intermediate-risk features (e.g., TIMI score 3?4) for short-term risk of death or nonfatal MI  Revascularization of the presumed culprit artery   A (8)  Indication: 10; Score: 8   HARDING, DAVID W

## 2015-11-22 NOTE — H&P (View-Only) (Signed)
  Cardiology Office Note  Date: 11/20/2015   ID: Lacey Bailey, DOB 12/23/1945, MRN [id number]  PCP: Steve Luking, MD  Primary Cardiologist: Samuel McDowell, MD   Chief Complaint  Patient presents with  . Follow-up testing  . Progressive chest pain    History of Present Illness: Lacey Bailey is a 70 y.o. female seen by me for the first time in February of this year, a former patient of Dr. McCabe with the [facility] cardiology practice. She was referred for a cardiac monitor and echocardiogram for further investigation of syncope. Echocardiogram showed normal LVEF without major valvular abnormalities to explain syncope. Full report is noted below. Her 30 day cardiac monitor showed sinus rhythm throughout with only rare PACs and brief atrial runs. Episode of reported syncope during monitoring did not correspond with any specific arrhythmias or pauses. I went over the results with her today.  She presents with her husband. She tells me that over the last week, unrelated to the prior episodes of syncope, she has been experiencing progressive exertional fatigue and shortness of breath, increased need to rest and sleep, also progressing episodes of chest pain consistent with her angina and requiring nitroglycerin. She has had grandchildren visiting, was hoping to be active with them, however has been very limited with these symptoms including even just basic ADLs.  She states that she has been taking her medications regularly. She almost went to the hospital with her most recent episode of chest pain due to intensity and duration.  She has a history of multivessel CAD status post CABG in 2003 as well as multiple PCI's previously in [place], and most recently DES to the posterolateral branch in April 2016 at [facility]. She has occlusion of all vein grafts, patency of the LIMA to LAD, and also had moderate in-stent restenosis within the circumflex at the time of her most recent  intervention.  Past Medical History  Diagnosis Date  . Depression   . Hypothyroidism   . CAD (coronary artery disease)     Multivessel status post CABG 2003 as well as multiple percutaneous interventions ([place]), DES to PLB 11/2014 ([facility])  . Essential hypertension   . Hyperlipidemia   . TIA (transient ischemic attack)   . Aortic sclerosis (HCC)     Past Surgical History  Procedure Laterality Date  . Coronary artery bypass graft  2003    [place]    Current Outpatient Prescriptions  Medication Sig Dispense Refill  . amLODipine (NORVASC) 5 MG tablet Take 5 mg by mouth daily.    . aspirin 81 MG tablet Take 81 mg by mouth daily.    . clopidogrel (PLAVIX) 75 MG tablet Take 75 mg by mouth daily.    . co-enzyme Q-10 30 MG capsule Take 200 mg by mouth daily.    . isosorbide mononitrate (IMDUR) 30 MG 24 hr tablet Take 30 mg by mouth daily.    . levothyroxine (SYNTHROID, LEVOTHROID) 75 MCG tablet Take 1 tablet (75 mcg total) by mouth daily before breakfast. 30 tablet 5  . lisinopril (PRINIVIL,ZESTRIL) 20 MG tablet Take 20 mg by mouth daily.    . metoprolol succinate (TOPROL-XL) 25 MG 24 hr tablet Take 12.5 mg by mouth daily.    . PARoxetine (PAXIL) 20 MG tablet Take 1 tablet (20 mg total) by mouth daily. 30 tablet 5  . pravastatin (PRAVACHOL) 40 MG tablet Take 40 mg by mouth daily.     No current facility-administered medications for this visit.     Allergies:  Review of patient's allergies indicates no known allergies.   Social History: The patient  reports that she has never smoked. She has never used smokeless tobacco.   Family History: The patient's family history includes Heart attack in her mother; Heart disease in her brother and mother.   ROS:  Please see the history of present illness. Otherwise, complete review of systems is positive for fatigue and anxiety.  All other systems are reviewed and negative.   Physical Exam: VS:  BP 100/60 mmHg  Pulse 74  Ht 5' 3"  (1.6 m)  Wt 219 lb (99.338 kg)  BMI 38.80 kg/m2  SpO2 98%, BMI Body mass index is 38.8 kg/(m^2).  Wt Readings from Last 3 Encounters:  11/20/15 219 lb (99.338 kg)  10/10/15 215 lb (97.523 kg)  10/08/15 213 lb (96.616 kg)    General: Patient appears comfortable at rest. HEENT: Conjunctiva and lids normal, oropharynx clear with moist mucosa. Neck: Supple, no elevated JVP or carotid bruits, no thyromegaly. Lungs: Clear to auscultation, nonlabored breathing at rest. Cardiac: Regular rate and rhythm, no S3 or significant systolic murmur, no pericardial rub. Abdomen: Soft, nontender, no hepatomegaly, bowel sounds present, no guarding or rebound. Extremities: No pitting edema, distal pulses 2+. Skin: Warm and dry. Musculoskeletal: No kyphosis. Neuropsychiatric: Alert and oriented x3, affect grossly appropriate.  ECG: I personally reviewed the prior tracing from 10/10/2015 which showed sinus rhythm with increased voltage and repolarization abnormalities.  Recent Labwork:  April 2016: Hemoglobin 11.4, platelets 190, potassium 4.0, BUN 12, creatinine 0.9, AST 22, ALT 17, cholesterol 105, triglycerides 127, HDL 30, LDL 50  Other Studies Reviewed Today:  Echocardiogram 10/17/2015: Study Conclusions  - Left ventricle: The cavity size was normal. Wall thickness was  increased in a pattern of mild LVH. Systolic function was  vigorous. The estimated ejection fraction was in the range of 65%  to 70%. Wall motion was normal; there were no regional wall  motion abnormalities. - Aortic valve: Trileaflet; mildly calcified leaflets. Moderate  calcification involving the noncoronary cusp. - Mitral valve: Calcified annulus. There was trivial regurgitation. - Right atrium: Central venous pressure (est): 3 mm Hg. - Tricuspid valve: There was trivial regurgitation. - Pulmonary arteries: PA peak pressure: 35 mm Hg (S). - Pericardium, extracardiac: There was no pericardial  effusion.  Impressions:  - Mild LVH with LVEF 65-70%. Grade 1 diastolic dysfunction with  equivocal LV filling pressure. MAC with trivial mitral  regurgitation. Moderately sclerotic aortic valve without  stenosis. Trivial tricuspid regurgitation with PASP estimated 35  mmHg.  Cardiac catheterization 11/30/2014 ([facility]): 1. Left Main - no angiographic stenosis. Bifurcates distally.  2. Left anterior descending artery - occluded at the ostium. No antegrade filling of the left anterior descending artery.  a. Diagonal branches - no significant diagonal branches noted.  3. Left Circumflex - anatomically nondominant. Mild proximal irregularities. Stent in the mid circumflex. The stent is notable for 50% diffuse in-stent restenosis. Just after the stented segment of the circumflex bifurcates into 2 marginal branches.   a. Obtuse Marginal branches - the first marginal branch has moderate proximal irregularities. This vessel is tented up as result of a prior bypass graft. The bypass graft appears to be occluded. The ongoing first marginal branch has diffuse moderate   irregularities. Second marginal branch is small with diffuse mild irregularities.  4. Right Coronary Artery - anatomically dominant. Mild proximal irregularities. Stent in the mid right coronary artery. 30% in-stent restenosis. Mid to distal right coronary artery   has mild diffuse irregularities. The distal right coronary artery   bifurcates into posterior descending artery and posterolateral branch..  a. Posterior Descending Artery - mild diffuse irregularities. No focal angiographic stenosis.  b. Posterolateral Branch - proximal segment reveals mild irregularities. There is a prior stent in the proximal to midportion of the posterior lateral branch of the distal right coronary artery. The stent is patent. There is a high-grade stenosis   just beyond the stented segment has 85-90% eccentric  stenosis in the mid posterior lateral branch. This branch then bifurcates into 2 subsidiary tributaries that had mild irregularities.  INTERVENTIONAL PROCEDURE SUMMARY:  The right coronary artery was engaged with the guide catheter and the guidewire was advanced across the lesion in the midsegment of the posterior lateral branch of the distal right coronary artery. The trek balloon was advanced into position across the  lesion in the mid posterior lateral branch. Inflations were performed to 6 atmospheres times 2:30-35 seconds each. A still remain a significant eccentric stenosis. The balloon was inflated to 10 atmospheres for 45 seconds of the obstructive stenosis was  relieved. Angiography revealed no evidence of dissection. The balloon was removed. A 2.5 x 16 mm Promus premier drug-eluting stent was obtained. This was inserted into the right coronary artery and placed across the mid vessel stenosis in the mid  posterior lateral branch. The stent was overlapped with the previous stent in the proximal to midportion of this branch vessel. The new stent was deployed at 14 atmospheres for 45 seconds. The stent balloon was removed. Angiography was performed with a  guidewire obtained and the guidewire removed. An excellent angiographic result was obtained. Excellent stent apposition. Stent well matched of the vessel dimension. No stent edge step up. No dissection. No intraluminal filling defects. No compromise of  tiny side branches or distal subsidiary tributaries. TIMI 3 flow maintained.  IMPRESSION:  1. Unstable angina. 2. Multivessel native coronary artery disease with prior stenting of all 3 coronary artery arteries, status post coronary artery bypass graft surgery times 4. Chronic total occlusion of native LAD at the ostium, chronic total occlusion of all 3 saphenous vein graft, patent LIMA LAD. Patent left circumflex with moderate in-stent restenosis. Patent dominant right coronary artery with  patent stents in the mid  and distal vessel progressive stenosis in the posterior lateral branch of the distal right coronary artery 5. Successful percutaneous coronary intervention of the post lateral branch of right coronary artery  Assessment and Plan:  1. Worsening exertional fatigue and dyspnea with accelerating angina within the last week on stable medical regimen and requiring additional nitroglycerin. Patient has a history of multivessel CAD with prior CABG and multiple interventions including more recently DES to the posterolateral branch in April of last year at [facility]. She had documentation of occlusion of all vein grafts at that time with patency of the LIMA to LAD and moderate in-stent restenosis in the circumflex. In light of her progressing symptoms we have discussed scheduling a cardiac catheterization to reevaluate coronary anatomy and determine if there are any significant revscularization options to consider. She is in agreement to proceed.  2. Essential hypertension, blood pressure low normal today.  3. History of syncope, at this point suspect perhaps prominent vagal tone as a contributor. She has normal LVEF by recent echocardiogram and her 30 day cardiac monitor did not demonstrate any significant arrhythmias that would explain her symptoms.  4. Hyperlipidemia, on Pravachol. Last LDL was 50.  Current medicines were reviewed with   the patient today.   Orders Placed This Encounter  Procedures  . DG Chest 2 View  . INR/PT  . CBC with Differential  . Basic Metabolic Panel (BMET)    Disposition: FU with me after cardiac catheterization.   Signed, Samuel G. McDowell, MD, FACC 11/20/2015 4:50 PM    [facility] [place] [address], [place], [place] [address] Phone: [phone]; Fax: [phone]  

## 2015-11-23 DIAGNOSIS — I200 Unstable angina: Secondary | ICD-10-CM

## 2015-11-23 DIAGNOSIS — T82855A Stenosis of coronary artery stent, initial encounter: Secondary | ICD-10-CM | POA: Diagnosis not present

## 2015-11-23 DIAGNOSIS — I25110 Atherosclerotic heart disease of native coronary artery with unstable angina pectoris: Secondary | ICD-10-CM | POA: Diagnosis not present

## 2015-11-23 DIAGNOSIS — I2582 Chronic total occlusion of coronary artery: Secondary | ICD-10-CM | POA: Diagnosis not present

## 2015-11-23 DIAGNOSIS — I25710 Atherosclerosis of autologous vein coronary artery bypass graft(s) with unstable angina pectoris: Secondary | ICD-10-CM | POA: Diagnosis not present

## 2015-11-23 LAB — CBC
HCT: 32.4 % — ABNORMAL LOW (ref 36.0–46.0)
Hemoglobin: 10.6 g/dL — ABNORMAL LOW (ref 12.0–15.0)
MCH: 28.8 pg (ref 26.0–34.0)
MCHC: 32.7 g/dL (ref 30.0–36.0)
MCV: 88 fL (ref 78.0–100.0)
PLATELETS: 281 10*3/uL (ref 150–400)
RBC: 3.68 MIL/uL — AB (ref 3.87–5.11)
RDW: 14 % (ref 11.5–15.5)
WBC: 8.9 10*3/uL (ref 4.0–10.5)

## 2015-11-23 LAB — BASIC METABOLIC PANEL
ANION GAP: 8 (ref 5–15)
BUN: 12 mg/dL (ref 6–20)
CALCIUM: 9 mg/dL (ref 8.9–10.3)
CO2: 23 mmol/L (ref 22–32)
CREATININE: 1.17 mg/dL — AB (ref 0.44–1.00)
Chloride: 108 mmol/L (ref 101–111)
GFR calc non Af Amer: 46 mL/min — ABNORMAL LOW (ref >60)
GFR, EST AFRICAN AMERICAN: 53 mL/min — AB (ref >60)
Glucose, Bld: 94 mg/dL (ref 65–99)
Potassium: 4 mmol/L (ref 3.5–5.1)
SODIUM: 139 mmol/L (ref 135–145)

## 2015-11-23 NOTE — Progress Notes (Signed)
Patient states she has had flu vaccine this season

## 2015-11-23 NOTE — Progress Notes (Signed)
Subjective: No CP or SOB.  She did ask about some night time leg pain that she has been having recently.  No sensitivity to touch.  Not related to walking.   Objective: Vital signs in last 24 hours: Temp:  [97.1 F (36.2 C)-97.9 F (36.6 C)] 97.1 F (36.2 C) (04/01 0346) Pulse Rate:  [0-93] 64 (04/01 0346) Resp:  [0-25] 17 (04/01 0346) BP: (112-169)/(47-112) 145/64 mmHg (04/01 0346) SpO2:  [0 %-100 %] 95 % (04/01 0346) Weight:  [215 lb (97.523 kg)-223 lb 1.7 oz (101.2 kg)] 223 lb 1.7 oz (101.2 kg) (04/01 0346) Last BM Date: 11/22/15  Intake/Output from previous day: 03/31 0701 - 04/01 0700 In: 2114.9 [P.O.:240; I.V.:1874.9] Out: 1850 [Urine:1850] Intake/Output this shift:    Medications Scheduled Meds: . amLODipine  5 mg Oral Daily  . aspirin EC  81 mg Oral Daily  . clopidogrel  75 mg Oral Daily  . isosorbide mononitrate  30 mg Oral Daily  . levothyroxine  75 mcg Oral QAC breakfast  . lisinopril  20 mg Oral BID  . metoprolol succinate  12.5 mg Oral QHS  . mirabegron ER  50 mg Oral QHS  . PARoxetine  20 mg Oral Daily  . pravastatin  40 mg Oral q1800  . sodium chloride flush  3 mL Intravenous Q12H   Continuous Infusions:  PRN Meds:.sodium chloride, acetaminophen, morphine injection, ondansetron (ZOFRAN) IV, sodium chloride flush  PE: General appearance: alert, cooperative and no distress Lungs: clear to auscultation bilaterally Heart: regular rate and rhythm, S1, S2 normal, no murmur, click, rub or gallop Extremities: No LEE Pulses: 2+ and symmetric Skin: Warm and dry.  Left radial cath site stable Neurologic: Grossly normal  Lab Results:   Recent Labs  11/22/15 0913 11/23/15 0334  WBC 8.3 8.9  HGB 11.4* 10.6*  HCT 33.2* 32.4*  PLT 291 281   BMET  Recent Labs  11/22/15 0914 11/23/15 0334  NA 140 139  K 4.3 4.0  CL 105 108  CO2 26 23  GLUCOSE 98 94  BUN 15 12  CREATININE 1.33* 1.17*  CALCIUM 9.3 9.0   PT/INR  Recent Labs  11/22/15 0913   LABPROT 13.1  INR 0.97   Post-Intervention Diagram          Assessment/Plan   70 y.o. Female with history of CABG seen by me for the first time in February of this year, a former patient of Dr. Ivan Croft with the [facility] cardiology practice. She was referred for a cardiac monitor and echocardiogram for further investigation of syncope. Echocardiogram showed normal LVEF without major valvular abnormalities to explain syncope. Full report is noted below. Her 30 day cardiac monitor showed sinus rhythm throughout with only rare PACs and brief atrial runs. Episode of reported syncope during monitoring did not correspond with any specific arrhythmias or pauses.  She presented to the office with progressive exertional fatigue and shortness of breath, increased need to rest and sleep, also progressing episodes of chest pain consistent with her angina and requiring nitroglycerin.  Principal Problem:   Accelerating angina [facility]) Active Problems:   Atherosclerosis of coronary artery bypass graft with angina pectoris (HCC)   Essential hypertension   Hyperlipidemia with target LDL less than 70   CAD S/P percutaneous coronary angioplasty   SP Left heart cath revealing:  1. Prox RCA to Mid RCA lesion, 30% stenosed - stent stenosis was diffuse.. The lesion was previously treated with a stent (unknown type). 2. Mid RCA lesion, 85%  stenosed - tandem 85% lesions in the distal portion of the stent. Post angiosculpt balloon angioplasty, there is a 30% residual stenosis. The lesion was previously treated. 3. Post Atrio lesion, 90% stenosed. As previous described 4. Prox Cx to Mid Cx lesion, 70% stenosed - diffuse in-stent restenosis. Post Cutting Balloon angioplasty, there is a 20% residual stenosis. The lesion was previously treated with a stent (unknown type). 5. Mid Cx lesion, 95% stenosed. Post balloon angioplasty, there is a 20% residual stenosis. 6. Ost LAD to Prox LAD lesion, 100% stenosed. As previously  described 7. LIMA is large, and is anatomically normal - with normal flow down relatively normal distal LAD. 8. SVGs were not injected . Known to be occluded 9. Ost 2nd Mrg lesion, 55% stenosed - jailed by original stent 10. The left ventricular systolic function is normal.  ASA, plavix, Imdur 30, amlodipine 5, lisinopril 20, metoprolol Suc12.5 QHS, statin .  SCr improved.   DC home today.  Follow up in 2 weeks in [place].  HAGER, BRYAN PA-C 11/23/2015 7:20 AM  History and all data above reviewed.  Patient examined.  I agree with the findings as above.  No pain.  No SOB.   The patient exam reveals COR:RRR  ,  Lungs: Clear  ,  Abd: Positive bowel sounds, no rebound no guarding, Ext Right femoral access site without bruising  .  All available labs, radiology testing, previous records reviewed. Agree with documented assessment and plan. CAD:  Status post PCI.  Ready to discharge.  Meds as above.  Follow up in [place].   Lacey Bailey  7:53 AM  11/23/2015

## 2015-11-23 NOTE — Progress Notes (Signed)
CARDIAC REHAB PHASE I   PRE:  Rate/Rhythm: Sinus 64  BP:  Supine: 137/73       SaO2: 98% Room Air  MODE:  Ambulation: 400 ft   POST:  Rate/Rhythem: Sinus Rhythm 81  BP:  Supine: 108/56      SaO2: 98% room  Air   Thayer HeadingsMaria Walden Whitaker RN BSN [phone] Patient ambulated at a slow and steady pace without complaints of chest pain. Patient educated on diet exercise and heart healthy diet. Mrs Aline AugustHolmes is interested in participating in phase 2 cardiac rehab at [facility] [place]

## 2015-11-23 NOTE — Discharge Summary (Signed)
Discharge Summary    Patient ID: Lacey Bailey,  MRN: [id number], DOB/AGE: 01-08-1946 70 y.o.  Admit date: 11/22/2015 Discharge date: 11/23/2015  Primary Care Provider: Lubertha South Primary Cardiologist: Diona Browner  Discharge Diagnoses    Principal Problem:   Accelerating angina [facility]) Active Problems:   Atherosclerosis of coronary artery bypass graft with angina pectoris [facility])   Essential hypertension   Hyperlipidemia with target LDL less than 70   CAD S/P percutaneous coronary angioplasty   Allergies No Known Allergies  Diagnostic Studies/Procedures    Procedures    Coronary Stent Intervention   Left Heart Cath and Cors/Grafts Angiography    Conclusion    1. Prox RCA to Mid RCA lesion, 30% stenosed - stent stenosis was diffuse.. The lesion was previously treated with a stent (unknown type). 2. Mid RCA lesion, 85% stenosed - tandem 85% lesions in the distal portion of the stent. Post angiosculpt balloon angioplasty, there is a 30% residual stenosis. The lesion was previously treated. 3. Post Atrio lesion, 90% stenosed. As previous described 4. Prox Cx to Mid Cx lesion, 70% stenosed - diffuse in-stent restenosis. Post Cutting Balloon angioplasty, there is a 20% residual stenosis. The lesion was previously treated with a stent (unknown type). 5. Mid Cx lesion, 95% stenosed. Post balloon angioplasty, there is a 20% residual stenosis. 6. Ost LAD to Prox LAD lesion, 100% stenosed. As previously described 7. LIMA is large, and is anatomically normal - with normal flow down relatively normal distal LAD. 8. SVGs were not injected . Known to be occluded 9. Ost 2nd Mrg lesion, 55% stenosed - jailed by original stent 10. The left ventricular systolic function is normal.   Apparently stable native coronary artery disease with known graft occlusion with exception of patent LIMA-LAD. Extensive stent work in the mid RCA and circumflex with in-stent restenosis treated with cutting  or sculpt balloon angioplasty. The distal edge of the circumflex stent 95% lesion was treated with balloon plasty only. This is because of difficulty advancing besides a compliant balloon through the upstream stent. There was a near stent-like result.  Plan:  Overnight observation following PTCA. Continue aspirin and Plavix as no stent was placed.  Restart other home medications.   Diagnostic Diagram                                                      Post-Intervention Diagram                   _____________   History of Present Illness     Lacey Bailey is a 70 y.o. female seen by me for the first time in February of this year, a former patient of Dr. Ivan Croft with the [facility] cardiology practice. She was referred for a cardiac monitor and echocardiogram for further investigation of syncope. Echocardiogram showed normal LVEF without major valvular abnormalities to explain syncope. Full report is noted below. Her 30 day cardiac monitor showed sinus rhythm throughout with only rare PACs and brief atrial runs. Episode of reported syncope during monitoring did not correspond with any specific arrhythmias or pauses. I went over the results with her today.  She presents with her husband. She tells me that over the last week, unrelated to the prior episodes of syncope, she has been experiencing progressive exertional fatigue and  shortness of breath, increased need to rest and sleep, also progressing episodes of chest pain consistent with her angina and requiring nitroglycerin. She has had grandchildren visiting, was hoping to be active with them, however has been very limited with these symptoms including even just basic ADLs.  She states that she has been taking her medications regularly. She almost went to the hospital with her most recent episode of chest pain due to intensity and duration.  She has a history of multivessel CAD status post CABG in 2003 as well as multiple PCI's previously  in [place], and most recently DES to the posterolateral branch in April 2016 [facility]. She has occlusion of all vein grafts, patency of the LIMA to LAD, and also had moderate in-stent restenosis within the circumflex at the time of her most recent intervention.    Hospital Course     Consultants:  Cardiac Rehab  She was admitted for a left heart cath which revealed: Prox RCA to Mid RCA lesion, 30% stenosed - stent stenosis was diffuse.. The lesion was previously treated with a stent (unknown type). Mid RCA lesion, 85% stenosed - tandem 85% lesions in the distal portion of the stent. Post angiosculpt balloon angioplasty, there is a 30% residual stenosis. The lesion was previously treated. Post Atrio lesion, 90% stenosed. As previous described Prox Cx to Mid Cx lesion, 70% stenosed - diffuse in-stent restenosis. Post Cutting Balloon angioplasty, there is a 20% residual stenosis. The lesion was previously treated with a stent (unknown type). Mid Cx lesion, 95% stenosed. Post balloon angioplasty, there is a 20% residual stenosis. Ost LAD to Prox LAD lesion, 100% stenosed. As previously described LIMA is large, and is anatomically normal - with normal flow down relatively normal distal LAD. SVGs were not injected . Known to be occluded Ost 2nd Mrg lesion, 55% stenosed - jailed by original stent The left ventricular systolic function is normal.  She was continued on ASA, plavix, Imdur 30, amlodipine 5, lisinopril 20, metoprolol Suc12.5 QHS, statin . SCr improved. The patient was seen by Dr. Antoine PocheHochrein who felt she was stable for DC home.  Follow up in 2 weeks with Dr. [place]iona [place]  _________  Discharge Vitals Blood pressure 131/60, pulse 65, temperature 98.1 F (36.7 C), temperature source Oral, resp. rate 22, height 5\' 2"  (1.575 m), weight 223 lb 1.7 oz (101.2 kg), SpO2 96 %.  Filed Weights   11/22/15 0835 11/23/15 0346  Weight: 215 lb (97.523 kg) 223 lb 1.7 oz (101.2  kg)    Labs & Radiologic Studies    CBC  Recent Labs  11/22/15 0913 11/23/15 0334  WBC 8.3 8.9  HGB 11.4* 10.6*  HCT 33.2* 32.4*  MCV 88.5 88.0  PLT 291 281   Basic Metabolic Panel  Recent Labs  11/22/15 0914 11/23/15 0334  NA 140 139  K 4.3 4.0  CL 105 108  CO2 26 23  GLUCOSE 98 94  BUN 15 12  CREATININE 1.33* 1.17*  CALCIUM 9.3 9.0   ____________   Dg Chest 2 View  11/20/2015  CLINICAL DATA:  Preoperative examination prior to heart catheterization ; history of quadruple bypass. EXAM: CHEST  2 VIEW COMPARISON:  None in PACs FINDINGS: The lungs are adequately inflated and clear. The heart is normal in size. There are post CABG changes. The sternal wires appear intact. The retrosternal soft tissues are normal. The pulmonary vascularity is not engorged. There is no pleural effusion. There is tortuosity of the  descending thoracic aorta. The bony thorax exhibits no acute abnormality. IMPRESSION: Post CABG changes.  There is no active cardiopulmonary disease. Electronically Signed   By: David  [name] M.D.   On: 11/20/2015 16:26   Disposition   Pt is being discharged home today in good condition.  Follow-up Plans & Appointments    Follow-up Information    Follow up with Nona Dell, MD.   Specialty:  Cardiology   Why:  The office will call you on Monday to schedule the follow up appt.   Contact information:   [address] [place] [address] [phone]      Discharge Instructions    Diet - low sodium heart healthy    Complete by:  As directed      Discharge instructions    Complete by:  As directed   No lifting with left arm for three days.     Increase activity slowly    Complete by:  As directed            Discharge Medications   Current Discharge Medication List    CONTINUE these medications which have NOT CHANGED   Details  amLODipine (NORVASC) 5 MG tablet Take 5 mg by mouth daily.    aspirin 81 MG tablet Take 81 mg by mouth daily.     clopidogrel (PLAVIX) 75 MG tablet Take 75 mg by mouth daily.    co-enzyme Q-10 30 MG capsule Take 200 mg by mouth daily.    diphenhydramine-acetaminophen (TYLENOL PM) 25-500 MG TABS tablet Take 1 tablet by mouth at bedtime as needed (for sleep).    isosorbide mononitrate (IMDUR) 30 MG 24 hr tablet Take 30 mg by mouth daily.    levothyroxine (SYNTHROID, LEVOTHROID) 75 MCG tablet Take 1 tablet (75 mcg total) by mouth daily before breakfast. Qty: 30 tablet, Refills: 5    lisinopril (PRINIVIL,ZESTRIL) 20 MG tablet Take 20 mg by mouth 2 (two) times daily.     metoprolol succinate (TOPROL-XL) 25 MG 24 hr tablet Take 12.5 mg by mouth daily.    mirabegron ER (MYRBETRIQ) 50 MG TB24 tablet Take 50 mg by mouth daily.    PARoxetine (PAXIL) 20 MG tablet Take 1 tablet (20 mg total) by mouth daily. Qty: 30 tablet, Refills: 5    pravastatin (PRAVACHOL) 40 MG tablet Take 40 mg by mouth daily.         Outstanding Labs/Studies     Duration of Discharge Encounter   Greater than 30 minutes including physician time.  SignedLeron Croak, Taylorann Tkach PAC 11/23/2015, 7:56 AM

## 2015-11-25 ENCOUNTER — Encounter (HOSPITAL_COMMUNITY): Payer: Self-pay | Admitting: Cardiology

## 2015-11-27 ENCOUNTER — Telehealth: Payer: Self-pay | Admitting: *Deleted

## 2015-11-27 NOTE — Telephone Encounter (Signed)
Spoke with patient and she c/o that at her heart cath site, right groin area, she is still seeing brownish color blood about 1/2 the size of a pence eraser on her band-aid. No c/o s/s of infection such as redness, drainage, fever or chills. Patient also c/o having swelling and soreness at her left wrist where first attempt for heart cath was done. Patient advised that these c/o were to be expected after a heart cath and that she should continue to monitor for s/s of infection and to contact our office if her symptoms got worse. Patient verbalized understanding of plan.

## 2015-11-29 ENCOUNTER — Telehealth: Payer: Self-pay | Admitting: Cardiology

## 2015-11-29 NOTE — Telephone Encounter (Signed)
Pt says bruising from cath has spread from wrist to the hand, pt was concerned. Denied inflamation, redness just mild soreness. Pt has post hospital appt 4/17 [place]. Pt advised that if not improving by Monday to call us back

## 2015-11-29 NOTE — Telephone Encounter (Signed)
Mrs. Lacey Bailey walked into the office this afternoon wanting to see if someone could look at her wrist area.

## 2015-12-04 ENCOUNTER — Ambulatory Visit (INDEPENDENT_AMBULATORY_CARE_PROVIDER_SITE_OTHER): Payer: PPO | Admitting: Urology

## 2015-12-04 DIAGNOSIS — N3281 Overactive bladder: Secondary | ICD-10-CM

## 2015-12-04 DIAGNOSIS — R32 Unspecified urinary incontinence: Secondary | ICD-10-CM

## 2015-12-09 ENCOUNTER — Encounter: Payer: PPO | Admitting: Adult Health

## 2015-12-11 ENCOUNTER — Ambulatory Visit (INDEPENDENT_AMBULATORY_CARE_PROVIDER_SITE_OTHER): Payer: PPO | Admitting: Physician Assistant

## 2015-12-11 ENCOUNTER — Encounter: Payer: Self-pay | Admitting: Physician Assistant

## 2015-12-11 VITALS — BP 118/78 | HR 88 | Ht 62.0 in | Wt 215.0 lb

## 2015-12-11 DIAGNOSIS — R5383 Other fatigue: Secondary | ICD-10-CM | POA: Insufficient documentation

## 2015-12-11 DIAGNOSIS — I10 Essential (primary) hypertension: Secondary | ICD-10-CM

## 2015-12-11 DIAGNOSIS — Z9861 Coronary angioplasty status: Secondary | ICD-10-CM

## 2015-12-11 DIAGNOSIS — I2510 Atherosclerotic heart disease of native coronary artery without angina pectoris: Secondary | ICD-10-CM | POA: Diagnosis not present

## 2015-12-11 NOTE — Patient Instructions (Signed)
Your physician wants you to follow-up in: 2-3 Months with Dr. Diona BrownerMcDowell in [place]. You will receive a reminder letter in the mail two months in advance. If you don't receive a letter, please call our office to schedule the follow-up appointment.  Your physician recommends that you schedule a follow-up appointment with Dr. Gerda DissLuking for Thyroid and Anemia   Your physician recommends that you continue on your current medications as directed. Please refer to the Current Medication list given to you today.  If you need a refill on your cardiac medications before your next appointment, please call your pharmacy.  Thank you for choosing [facility]!

## 2015-12-11 NOTE — Progress Notes (Signed)
Cardiology Office Note    Date:  12/11/2015   ID:  Lacey, Bailey 09/17/1945, MRN [id number]  PCP:  Lubertha South, MD  Cardiologist: Dr. Diona Browner  Chief complaint fatigue  History of Present Illness:  Lacey Bailey is a 70 y.o. female patient who was [facility] followed at [facility]. She has history of coronary artery disease status post CABG in 2003 as well as multiple PCI's previously and [place] and most recently DES to the PLA in April 2016 at [facility]. She has occlusion of all vein grafts with patency of the LIMA to the LAD and also had a moderate in-stent restenosis within the circumflex at that time of her most recent intervention. She saw Dr. Diona Browner for further investigation of syncope. Echo showed normal LVEF without major valvular abnormalities to explain syncope.. A cardiac monitor showed sinus rhythm with only rare PACs and brief atrial runs. She also had progressive exertional fatigue and shortness of breath. Cardiac catheterization was recommended.  She underwent cardiac catheterization 11/22/15 with patent LIMA to the LAD and known graft occlusions. She had extensive stent work in the mid RCA and circumflex for in-stent restenosis treated with cutting or scope balloon angioplasty. The distal edge of the circumflex stent 95% lesion was treated with balloon plasty only. This is because of difficulty advancing besides the compliant balloon through the upstream stent. There was a near stent-like result. She was observed overnight following PTCA and maintained on aspirin and Plavix is no stent was placed. Please see cardiac catheterization Details below.  Patient comes in today accompanied by her husband. She is tearful. She says her shortness of breath has greatly improved and she has noted significant difference in how she feels. She still has significant fatigue. She says if she tries to do dishes she has to lay down for a few minutes before she can do  something else. She is walking about 10 minutes twice a day and no longer gets short of breath but is fatigued. Her thyroid hasn't been checked in over a year and she does have some anemia. She denies any further chest pain.    Past Medical History  Diagnosis Date  . Depression   . Hypothyroidism   . CAD (coronary artery disease)     Multivessel status post CABG 2003 as well as multiple percutaneous interventions [place] [place]), DES to PLB 11/2014 ([facility])  . Essential hypertension   . Hyperlipidemia   . TIA (transient ischemic attack) ~ 2013  . Aortic sclerosis (HCC)   . Kidney stones   . Anginal pain [facility])     Past Surgical History  Procedure Laterality Date  . Coronary angioplasty with stent placement  "several"    "last one was 11/2014:  DES to the posterolateral branch /notes 11/20/2015  . Cardiac catheterization  11/22/2015  . Coronary artery bypass graft  2003    [place]; CABG X 4  . Tonsillectomy    . Appendectomy    . Laparoscopic cholecystectomy    . Kidney stone surgery      "opened me up"  . Abdominal hysterectomy    . Dilation and curettage of uterus    . Cesarean section  1977  . Tubal ligation    . Cataract extraction, bilateral Bilateral   . Blepharoplasty Bilateral     "uppers"  . Cardiac catheterization N/A 11/22/2015    Procedure: Left Heart Cath and Cors/Grafts Angiography;  Surgeon: Marykay Lex, MD;  Location: [facility] INVASIVE  CV LAB;  Service: Cardiovascular;  Laterality: N/A;  . Cardiac catheterization N/A 11/22/2015    Procedure: Coronary Stent Intervention;  Surgeon: Marykay Lexavid W Harding, MD;  [facility] IN[facility] CV LAB;  Service: Cardiovascular;  Laterality: N/A;    Current Medications: Outpatient Prescriptions Prior to Visit  Medication Sig Dispense Refill  . amLODipine (NORVASC) 5 MG tablet Take 5 mg by mouth daily.    Marland Kitchen. aspirin 81 MG tablet Take 81 mg by mouth daily.    . clopidogrel (PLAVIX) 75 MG tablet Take 75 mg by mouth daily.    Marland Kitchen.  co-enzyme Q-10 30 MG capsule Take 200 mg by mouth daily.    . diphenhydramine-acetaminophen (TYLENOL PM) 25-500 MG TABS tablet Take 1 tablet by mouth at bedtime as needed (for sleep).    . isosorbide mononitrate (IMDUR) 30 MG 24 hr tablet Take 30 mg by mouth daily.    Marland Kitchen. levothyroxine (SYNTHROID, LEVOTHROID) 75 MCG tablet Take 1 tablet (75 mcg total) by mouth daily before breakfast. 30 tablet 5  . lisinopril (PRINIVIL,ZESTRIL) 20 MG tablet Take 20 mg by mouth 2 (two) times daily.     . metoprolol succinate (TOPROL-XL) 25 MG 24 hr tablet Take 12.5 mg by mouth daily.    . mirabegron ER (MYRBETRIQ) 50 MG TB24 tablet Take 50 mg by mouth daily.    Marland Kitchen. PARoxetine (PAXIL) 20 MG tablet Take 1 tablet (20 mg total) by mouth daily. 30 tablet 5  . pravastatin (PRAVACHOL) 40 MG tablet Take 40 mg by mouth daily.     No facility-administered medications prior to visit.     Allergies:   Review of patient's allergies indicates no known allergies.   Social History   Social History  . Marital Status: Married    Spouse Name: N/A  . Number of Children: N/A  . Years of Education: N/A   Social History Main Topics  . Smoking status: Never Smoker   . Smokeless tobacco: Never Used  . Alcohol Use: 0.0 oz/week    0 Standard drinks or equivalent per week     Comment: 11/22/2015 "nothing since ~ 2005; occasionally had a drink w/dinner before 2005"  . Drug Use: No  . Sexual Activity: Not Currently   Other Topics Concern  . None   Social History Narrative     Family History:  The patient's    family history includes Heart attack in her mother; Heart disease in her brother and mother.   ROS:   Please see the history of present illness.    Review of Systems  Constitution: Positive for weakness and malaise/fatigue.  Cardiovascular: Negative.   Respiratory: Negative.   Musculoskeletal: Positive for muscle cramps and muscle weakness.  Genitourinary: Positive for bladder incontinence.  Neurological: Positive  for excessive daytime sleepiness.  Psychiatric/Behavioral: Positive for depression.   All other systems reviewed and are negative.   PHYSICAL EXAM:   VS:  BP 118/78 mmHg  Pulse 88  Ht 5\' 2"  (1.575 m)  Wt 215 lb (97.523 kg)  BMI 39.31 kg/m2  SpO2 98%   GEN: Obese, in no acute distress Neck: no JVD, carotid bruits, or masses Cardiac: RRR; no murmurs, rubs, or gallops,no edema right groin without hematoma or hemorrhage at cath site good femoral pulse and distal pulses. Respiratory:  clear to auscultation bilaterally, normal work of breathing GI: soft, nontender, nondistended, + BS MS: no deformity or atrophy Skin: warm and dry, no rash Neuro:  Alert and Oriented x 3,  Psych: Tearful  Wt Readings from Last 3 Encounters:  12/11/15 215 lb (97.523 kg)  11/23/15 223 lb 1.7 oz (101.2 kg)  11/20/15 219 lb (99.338 kg)      Studies/Labs Reviewed:   EKG:  EKG is Not ordered today.    Recent Labs: 11/23/2015: BUN 12; Creatinine, Ser 1.17*; Hemoglobin 10.6*; Platelets 281; Potassium 4.0; Sodium 139   Lipid Panel No results found for: CHOL, TRIG, HDL, CHOLHDL, VLDL, LDLCALC, LDLDIRECT  Additional studies/ records that were reviewed today include:      Diagnostic Studies/Procedures      Procedures      Coronary Stent Intervention      Left Heart Cath and Cors/Grafts Angiography       Conclusion      1. Prox RCA to Mid RCA lesion, 30% stenosed - stent stenosis was diffuse.. The lesion was previously treated with a stent (unknown type). 2. Mid RCA lesion, 85% stenosed - tandem 85% lesions in the distal portion of the stent. Post angiosculpt balloon angioplasty, there is a 30% residual stenosis. The lesion was previously treated. 3. Post Atrio lesion, 90% stenosed. As previous described 4. Prox Cx to Mid Cx lesion, 70% stenosed - diffuse in-stent restenosis. Post Cutting Balloon angioplasty, there is a 20% residual stenosis. The lesion was previously treated with a stent (unknown  type). 5. Mid Cx lesion, 95% stenosed. Post balloon angioplasty, there is a 20% residual stenosis. 6. Ost LAD to Prox LAD lesion, 100% stenosed. As previously described 7. LIMA is large, and is anatomically normal - with normal flow down relatively normal distal LAD. 8. SVGs were not injected . Known to be occluded 9. Ost 2nd Mrg lesion, 55% stenosed - jailed by original stent 10. The left ventricular systolic function is normal.    Apparently stable native coronary artery disease with known graft occlusion with exception of patent LIMA-LAD. Extensive stent work in the mid RCA and circumflex with in-stent restenosis treated with cutting or sculpt balloon angioplasty. The distal edge of the circumflex stent 95% lesion was treated with balloon plasty only. This is because of difficulty advancing besides a compliant balloon through the upstream stent. There was a near stent-like result.  Plan:  Overnight observation following PTCA. Continue aspirin and Plavix as no stent was placed.  Restart other home medications.       ASSESSMENT:    1. CAD S/P percutaneous coronary angioplasty   2. Essential hypertension   3. Other fatigue      PLAN:  In order of problems listed above:  Patient is cardiac symptoms have improved since her recent angioplasty. She does have improved dyspnea on exertion and is able to do a little bit more. Her main complaint is extreme fatigue. She hasn't had her thyroid checked in over a year and she is anemic. I asked her to have these things worked up by Dr. Gerda Diss. Blood pressure is stable. Recommend follow-up with Dr. Diona Browner in the [place] in 2-3 months. No change in medications today.   Medication Adjustments/Labs and Tests Ordered: Current medicines are reviewed at length with the patient today.  Concerns regarding medicines are outlined above.  Medication changes, Labs and Tests ordered today are listed in the Patient Instructions below. Patient Instructions   Your physician wants you to follow-up in: 2-3 Months with Dr. Diona Browner in [place]. You will receive a reminder letter in the mail two months in advance. If you don't receive a letter, please call our office to schedule the follow-up appointment.  Your  physician recommends that you schedule a follow-up appointment with Dr. Gerda Diss for Thyroid and Anemia   Your physician recommends that you continue on your current medications as directed. Please refer to the Current Medication list given to you today.  If you need a refill on your cardiac medications before your next appointment, please call your pharmacy.  Thank you for choosing [facility]!       Elson Clan, PA-C  12/11/2015 3:05 PM    [facility] [address] [place], [place], [place]  [address] Phone: [phone]; Fax: [phone]

## 2015-12-19 ENCOUNTER — Ambulatory Visit (INDEPENDENT_AMBULATORY_CARE_PROVIDER_SITE_OTHER): Payer: PPO | Admitting: Family Medicine

## 2015-12-19 ENCOUNTER — Encounter: Payer: Self-pay | Admitting: Family Medicine

## 2015-12-19 VITALS — BP 110/68 | Ht 62.0 in | Wt 214.4 lb

## 2015-12-19 DIAGNOSIS — R5383 Other fatigue: Secondary | ICD-10-CM | POA: Diagnosis not present

## 2015-12-19 DIAGNOSIS — E785 Hyperlipidemia, unspecified: Secondary | ICD-10-CM

## 2015-12-19 DIAGNOSIS — E038 Other specified hypothyroidism: Secondary | ICD-10-CM

## 2015-12-19 DIAGNOSIS — I10 Essential (primary) hypertension: Secondary | ICD-10-CM

## 2015-12-19 DIAGNOSIS — Z139 Encounter for screening, unspecified: Secondary | ICD-10-CM | POA: Diagnosis not present

## 2015-12-19 MED ORDER — LEVOTHYROXINE SODIUM 75 MCG PO TABS: 75.0000 ug | ORAL_TABLET | Freq: Every day | ORAL | Status: DC

## 2015-12-19 MED ORDER — PAROXETINE HCL 20 MG PO TABS: 20.0000 mg | ORAL_TABLET | Freq: Every day | ORAL | Status: DC

## 2015-12-19 NOTE — Progress Notes (Signed)
   Subjective:    Patient ID: Lacey Bailey, female    DOB: 1946/08/08, 70 y.o.   MRN: [id number] Patient arrives office for protracted visit. Hypertension This is a chronic problem. The current episode started more than 1 year ago. There are no compliance problems.    Patient would like to discuss chronic fatigue. This is been coming on the past several years. Has seemed to worsen the past year.  Surgery major cardiac, evaluation and stenting. The midst of completion cardiovascular workup.  Anemia discovered. No history of this in the past.  Compliant with thyroid medicine. Does not miss a dose. Has not had blood work for year.  Compliant with lipid medication. No specific lipid profile prescribed  Ten yr pass three yrs ago on the colonoscopy  Mood happy,  No pot low,     Review of Systems No headache, no major weight loss or weight gain, no chest pain no back pain abdominal pain no change in bowel habits complete ROS otherwise negative     Objective:   Physical Exam  Alert vitals stable. HEENT normal. Lungs clear. Heart regular in rhythm. Patient is overweight neck supple no palpable thyroid      Assessment & Plan:  Impression #1 fatigue likely multi-factorial including deconditioning recent stress etc. However #2 or #3 could plan to it also #2 anemia status uncertain #3 hypothyroidism status uncertain discuss #4 hyperlipidemia status uncertain 5 hypertension good control plan appropriate blood work diet exercise discussed. In encourage wellness exam encouragemaintain same dose antidepressant

## 2015-12-24 DIAGNOSIS — E785 Hyperlipidemia, unspecified: Secondary | ICD-10-CM | POA: Diagnosis not present

## 2015-12-24 DIAGNOSIS — R5383 Other fatigue: Secondary | ICD-10-CM | POA: Diagnosis not present

## 2015-12-24 DIAGNOSIS — I10 Essential (primary) hypertension: Secondary | ICD-10-CM | POA: Diagnosis not present

## 2015-12-25 LAB — BASIC METABOLIC PANEL
BUN/Creatinine Ratio: 14 (ref 12–28)
BUN: 17 mg/dL (ref 8–27)
CALCIUM: 10.2 mg/dL (ref 8.7–10.3)
CO2: 25 mmol/L (ref 18–29)
Chloride: 100 mmol/L (ref 96–106)
Creatinine, Ser: 1.2 mg/dL — ABNORMAL HIGH (ref 0.57–1.00)
GFR, EST AFRICAN AMERICAN: 53 mL/min/{1.73_m2} — AB (ref >59)
GFR, EST NON AFRICAN AMERICAN: 46 mL/min/{1.73_m2} — AB (ref >59)
Glucose: 96 mg/dL (ref 65–99)
Potassium: 5 mmol/L (ref 3.5–5.2)
Sodium: 141 mmol/L (ref 134–144)

## 2015-12-25 LAB — HEPATIC FUNCTION PANEL (6)
ALBUMIN: 4.5 g/dL (ref 3.5–4.8)
ALT: 15 IU/L (ref 0–32)
AST: 16 IU/L (ref 0–40)
Alkaline Phosphatase: 75 IU/L (ref 39–117)
BILIRUBIN TOTAL: 0.6 mg/dL (ref 0.0–1.2)
Bilirubin, Direct: 0.14 mg/dL (ref 0.00–0.40)

## 2015-12-25 LAB — CBC WITH DIFFERENTIAL/PLATELET
BASOS ABS: 0.1 10*3/uL (ref 0.0–0.2)
BASOS: 1 %
EOS (ABSOLUTE): 0.3 10*3/uL (ref 0.0–0.4)
Eos: 5 %
HEMOGLOBIN: 12.7 g/dL (ref 11.1–15.9)
Hematocrit: 38.5 % (ref 34.0–46.6)
IMMATURE GRANS (ABS): 0 10*3/uL (ref 0.0–0.1)
IMMATURE GRANULOCYTES: 0 %
LYMPHS: 25 %
Lymphocytes Absolute: 1.9 10*3/uL (ref 0.7–3.1)
MCH: 30.3 pg (ref 26.6–33.0)
MCHC: 33 g/dL (ref 31.5–35.7)
MCV: 92 fL (ref 79–97)
MONOCYTES: 8 %
Monocytes Absolute: 0.6 10*3/uL (ref 0.1–0.9)
NEUTROS ABS: 4.7 10*3/uL (ref 1.4–7.0)
NEUTROS PCT: 61 %
PLATELETS: 372 10*3/uL (ref 150–379)
RBC: 4.19 x10E6/uL (ref 3.77–5.28)
RDW: 15.1 % (ref 12.3–15.4)
WBC: 7.6 10*3/uL (ref 3.4–10.8)

## 2015-12-25 LAB — LIPID PANEL
CHOL/HDL RATIO: 4.9 ratio — AB (ref 0.0–4.4)
Cholesterol, Total: 180 mg/dL (ref 100–199)
HDL: 37 mg/dL — ABNORMAL LOW (ref >39)
LDL CALC: 86 mg/dL (ref 0–99)
Triglycerides: 286 mg/dL — ABNORMAL HIGH (ref 0–149)
VLDL CHOLESTEROL CAL: 57 mg/dL — AB (ref 5–40)

## 2015-12-25 LAB — FOLATE: FOLATE: 11.5 ng/mL (ref >3.0)

## 2015-12-25 LAB — VITAMIN B12: VITAMIN B 12: 266 pg/mL (ref 211–946)

## 2015-12-25 LAB — TSH: TSH: 2.57 u[IU]/mL (ref 0.450–4.500)

## 2015-12-25 LAB — T4: T4, Total: 8.9 ug/dL (ref 4.5–12.0)

## 2015-12-29 ENCOUNTER — Encounter: Payer: Self-pay | Admitting: Family Medicine

## 2016-02-10 ENCOUNTER — Encounter: Payer: PPO | Admitting: Nurse Practitioner

## 2016-02-12 ENCOUNTER — Ambulatory Visit (INDEPENDENT_AMBULATORY_CARE_PROVIDER_SITE_OTHER): Payer: PPO | Admitting: Urology

## 2016-02-12 DIAGNOSIS — N3281 Overactive bladder: Secondary | ICD-10-CM

## 2016-02-12 DIAGNOSIS — N3941 Urge incontinence: Secondary | ICD-10-CM | POA: Diagnosis not present

## 2016-02-27 ENCOUNTER — Ambulatory Visit: Payer: PPO | Admitting: Cardiology

## 2016-03-02 ENCOUNTER — Ambulatory Visit: Payer: PPO | Admitting: Family Medicine

## 2016-03-05 ENCOUNTER — Other Ambulatory Visit: Payer: Self-pay | Admitting: Cardiology

## 2016-03-19 NOTE — Progress Notes (Signed)
Cardiology Office Note  Date: 03/20/2016   ID: Lacey Bailey, Lacey Bailey [birth year], MRN [id number]  PCP: Lacey South, MD  Primary Cardiologist: Lacey Dell, MD   Chief Complaint  Patient presents with  . Coronary Artery Disease    History of Present Illness: Lacey Bailey is a 70 y.o. female last seen in April by Ms. Lacey Bers PA-C. I had referred her prior to this for a cardiac catheterization with worsening angina symptoms and significant cardiac history as outlined below. Procedure is outlined below. In summary she has a patent LIMA to LAD with otherwise occluded vein grafts, evidence of previous stenting within the RCA and circumflex. In-stent restenosis within the circumflex stent was treated with balloon angioplasty, unable to pass a stent.  She has a history of multivessel CAD status post CABG in 2003 as well as multiple PCI's previously in [place], and most recently DES to the posterolateral branch in April 2016 at [facility]. She has occlusion of all vein grafts, patency of the LIMA to LAD, and also had moderate in-stent restenosis within the circumflex at the time of her  [place] intervention.  She is here today with her husband for follow-up visit. In the context of traveling since I last saw her she has had recurring angina symptoms, and also what sounds to be potentially reflux. Also continued dyspnea on exertion that limits her activity, sometimes she has to use a wheelchair. She is using nitroglycerin intermittently, interestingly has also tried Tums with some of her symptoms and had good effect.  Today we discussed her present cardiac status following recent revascularization. It is quite likely that she will continue to manifest angina symptoms and dyspnea on exertion, we will continue with medical therapy which could potentially include Ranexa as a next addition. We also talked about trying a PPI to see if reflux is contributing at least partially to some of what  she is feeling.  Past Medical History:  Diagnosis Date  . Anginal pain (HCC)   . Aortic sclerosis (HCC)   . CAD (coronary artery disease)    Multivessel status post CABG 2003 as well as multiple percutaneous interventions [place]acey [place]), DES to PLB 11/2014 ([facility])  . Depression   . Essential hypertension   . Hyperlipidemia   . Hypothyroidism   . Kidney stones   . TIA (transient ischemic attack) ~ 2013    Past Surgical History:  Procedure Laterality Date  . ABDOMINAL HYSTERECTOMY    . APPENDECTOMY    . BLEPHAROPLASTY Bilateral    "uppers"  . CARDIAC CATHETERIZATION  11/22/2015  . CARDIAC CATHETERIZATION N/A 11/22/2015   Procedure: Left Heart Cath and Cors/Grafts Angiography;  Surgeon: Lacey Lex, MD;  Location: [facility] INVASIVE CV LAB;  Service: Cardiovascular;  Laterality: N/A;  . CARDIAC CATHETERIZATION N/A 11/22/2015   Procedure: Coronary Stent Intervention;  Surgeon: Lacey Lex, MD;  Location: [facility] [place] [facility] INVASIVE CV LAB;  Service: Cardiovascular;  Laterality: N/A;  . CATARACT EXTRACTION, BILATERAL Bilateral   . CESAREAN SECTION  1977  . CORONARY ANGIOPLASTY WITH STENT PLACEMENT  "several"   "last one was 11/2014:  DES to the posterolateral branch /notes 11/20/2015  . CORONARY ARTERY BYPASS GRAFT  2003   [place]; CABG X 4  . DILATION AND CURETTAGE OF UTERUS    . KIDNEY STONE SURGERY     "opened me up"  . LAPAROSCOPIC CHOLECYSTECTOMY    . TONSILLECTOMY    . TUBAL LIGATION      Current Outpatient Prescriptions  Medication Sig Dispense Refill  . amLODipine (NORVASC) 5 MG tablet Take 1 tablet (5 mg total) by mouth daily. 90 tablet 3  . aspirin 81 MG tablet Take 81 mg by mouth daily.    . clopidogrel (PLAVIX) 75 MG tablet Take 1 tablet (75 mg total) by mouth daily. 90 tablet 3  . co-enzyme Q-10 30 MG capsule Take 200 mg by mouth daily.    . diphenhydramine-acetaminophen (TYLENOL PM) 25-500 MG TABS tablet Take 1 tablet by mouth at bedtime as needed (for sleep).    . isosorbide  mononitrate (IMDUR) 30 MG 24 hr tablet Take 30 mg by mouth daily.    Marland Kitchen levothyroxine (SYNTHROID, LEVOTHROID) 75 MCG tablet Take 1 tablet (75 mcg total) by mouth daily before breakfast. 30 tablet 5  . lisinopril (PRINIVIL,ZESTRIL) 20 MG tablet TAKE ONE TABLET BY MOUTH TWICE DAILY 180 tablet 0  . metoprolol succinate (TOPROL-XL) 25 MG 24 hr tablet Take 12.5 mg by mouth daily.    . mirabegron ER (MYRBETRIQ) 50 MG TB24 tablet Take 50 mg by mouth daily.    Marland Kitchen PARoxetine (PAXIL) 20 MG tablet Take 1 tablet (20 mg total) by mouth daily. 30 tablet 5  . pravastatin (PRAVACHOL) 40 MG tablet Take 40 mg by mouth daily.    . nitroGLYCERIN (NITROSTAT) 0.4 MG SL tablet Place 1 tablet (0.4 mg total) under the tongue every 5 (five) minutes as needed for chest pain. If no relief after the 3 rd dose, proceed to the ED for an evaluation 25 tablet 3  . pantoprazole (PROTONIX) 40 MG tablet Take 1 tablet (40 mg total) by mouth daily. 90 tablet 3   No current facility-administered medications for this visit.    Allergies:  Review of patient's allergies indicates no known allergies.   Social History: The patient  reports that she has never smoked. She has never used smokeless tobacco. She reports that she drinks alcohol. She reports that she does not use drugs.   ROS:  Please see the history of present illness. Otherwise, complete review of systems is positive for exertional fatigue, reflux.  All other systems are reviewed and negative.   Physical Exam: VS:  BP 96/66   Pulse 66   Ht  (1.575 m)   Wt 214 lb 9.6 oz (97.3 kg)   SpO2 98%   BMI 39.25 kg/m , BMI Body mass index is 39.25 kg/m.  Wt Readings from Last 3 Encounters:  03/20/16 214 lb 9.6 oz (97.3 kg)  12/19/15 214 lb 6 oz (97.2 kg)  12/11/15 215 lb (97.5 kg)    General: Overweight woman, appears comfortable at rest. HEENT: Conjunctiva and lids normal, oropharynx clear with moist mucosa. Neck: Supple, no elevated JVP or carotid bruits, no  thyromegaly. Lungs: Clear to auscultation, nonlabored breathing at rest. Cardiac: Regular rate and rhythm, no S3 or significant systolic murmur, no pericardial rub. Abdomen: Soft, nontender, no hepatomegaly, bowel sounds present, no guarding or rebound. Extremities: No pitting edema, distal pulses 2+. Skin: Warm and dry. Musculoskeletal: No kyphosis. Neuropsychiatric: Alert and oriented x3, affect grossly appropriate.   ECG: I personally reviewed the tracing from 11/23/2015 which showed normal sinus rhythm with increased voltage and diffuse nonspecific ST-T changes.  Recent Labwork: 11/23/2015: Hemoglobin 10.6 12/24/2015: ALT 15; AST 16; BUN 17; Creatinine, Ser 1.20; Platelets 372; Potassium 5.0; Sodium 141; TSH 2.570     Component Value Date/Time   CHOL 180 12/24/2015 0917   TRIG 286 (H) 12/24/2015 0917   HDL 37 (  L) 12/24/2015 0917   CHOLHDL 4.9 (H) 12/24/2015 0917   LDLCALC 86 12/24/2015 0917    Other Studies Reviewed Today:  Echocardiogram 10/17/2015: Study Conclusions  - Left ventricle: The cavity size was normal. Wall thickness was  increased in a pattern of mild LVH. Systolic function was  vigorous. The estimated ejection fraction was in the range of 65%  to 70%. Wall motion was normal; there were no regional wall  motion abnormalities. - Aortic valve: Trileaflet; mildly calcified leaflets. Moderate  calcification involving the noncoronary cusp. - Mitral valve: Calcified annulus. There was trivial regurgitation. - Right atrium: Central venous pressure (est): 3 mm Hg. - Tricuspid valve: There was trivial regurgitation. - Pulmonary arteries: PA peak pressure: 35 mm Hg (S). - Pericardium, extracardiac: There was no pericardial effusion.  Impressions:  - Mild LVH with LVEF 65-70%. Grade 1 diastolic dysfunction with  equivocal LV filling pressure. MAC with trivial mitral  regurgitation. Moderately sclerotic aortic valve without  stenosis. Trivial tricuspid  regurgitation with PASP estimated 35  mmHg.  Cardiac catheterization 11/22/2015: 1. Prox RCA to Mid RCA lesion, 30% stenosed - stent stenosis was diffuse.. The lesion was previously treated with a stent (unknown type). 2. Mid RCA lesion, 85% stenosed - tandem 85% lesions in the distal portion of the stent. Post angiosculpt balloon angioplasty, there is a 30% residual stenosis. The lesion was previously treated. 3. Post Atrio lesion, 90% stenosed. As previous described 4. Prox Cx to Mid Cx lesion, 70% stenosed - diffuse in-stent restenosis. Post Cutting Balloon angioplasty, there is a 20% residual stenosis. The lesion was previously treated with a stent (unknown type). 5. Mid Cx lesion, 95% stenosed. Post balloon angioplasty, there is a 20% residual stenosis. 6. Ost LAD to Prox LAD lesion, 100% stenosed. As previously described 7. LIMA is large, and is anatomically normal - with normal flow down relatively normal distal LAD. 8. SVGs were not injected . Known to be occluded 9. Ost 2nd Mrg lesion, 55% stenosed - jailed by original stent 10. The left ventricular systolic function is normal.    Apparently stable native coronary artery disease with known graft occlusion with exception of patent LIMA-LAD. Extensive stent work in the mid RCA and circumflex with in-stent restenosis treated with cutting or sculpt balloon angioplasty. The distal edge of the circumflex stent 95% lesion was treated with balloon plasty only. This is because of difficulty advancing besides a compliant balloon through the upstream stent. There was a near stent-like result.  Assessment and Plan:  1. Complex multivessel CAD and graft disease as outlined above. Most recent intervention was angioplasty to in-stent restenosis within the circumflex. Blood pressure and heart rate are very well controlled today on current regimen. Ranexa would be a consideration as a next step depending on symptom control.  2. Reflux symptoms.  Trial of Protonix.  3. Essential hypertension, blood pressure is well controlled.  4. Hyperlipidemia, continue on Pravachol. Last LDL 86.  Current medicines were reviewed with the patient today.    Disposition: Follow-up with me in 3 months.  Signed, Jonelle Sidle, MD, [facility] 03/20/2016 12:50 PM    [facility] [address] [place], [place], [place] [address] Phone: [phone]; Fax: [phone]

## 2016-03-20 ENCOUNTER — Ambulatory Visit (INDEPENDENT_AMBULATORY_CARE_PROVIDER_SITE_OTHER): Payer: PPO | Admitting: Cardiology

## 2016-03-20 ENCOUNTER — Encounter: Payer: Self-pay | Admitting: Cardiology

## 2016-03-20 ENCOUNTER — Other Ambulatory Visit: Payer: Self-pay | Admitting: *Deleted

## 2016-03-20 VITALS — BP 96/66 | HR 66 | Ht 62.0 in | Wt 214.6 lb

## 2016-03-20 DIAGNOSIS — K219 Gastro-esophageal reflux disease without esophagitis: Secondary | ICD-10-CM | POA: Diagnosis not present

## 2016-03-20 DIAGNOSIS — I10 Essential (primary) hypertension: Secondary | ICD-10-CM | POA: Diagnosis not present

## 2016-03-20 DIAGNOSIS — E782 Mixed hyperlipidemia: Secondary | ICD-10-CM | POA: Diagnosis not present

## 2016-03-20 DIAGNOSIS — I25119 Atherosclerotic heart disease of native coronary artery with unspecified angina pectoris: Secondary | ICD-10-CM | POA: Diagnosis not present

## 2016-03-20 MED ORDER — CLOPIDOGREL BISULFATE 75 MG PO TABS: 75.0000 mg | ORAL_TABLET | Freq: Every day | ORAL | 3 refills | Status: DC

## 2016-03-20 MED ORDER — NITROGLYCERIN 0.4 MG SL SUBL: 0.4000 mg | SUBLINGUAL_TABLET | SUBLINGUAL | 3 refills | Status: DC | PRN

## 2016-03-20 MED ORDER — AMLODIPINE BESYLATE 5 MG PO TABS: 5.0000 mg | ORAL_TABLET | Freq: Every day | ORAL | 3 refills | Status: DC

## 2016-03-20 MED ORDER — PANTOPRAZOLE SODIUM 40 MG PO TBEC: 40.0000 mg | DELAYED_RELEASE_TABLET | Freq: Every day | ORAL | 3 refills | Status: DC

## 2016-03-20 NOTE — Patient Instructions (Addendum)
Medication Instructions:   Your physician has recommended you make the following change in your medication:   Start protonix 40 mg daily.  Continue all other medications the same.  Labwork: NONE  Testing/Procedures: NONE  Follow-Up:  Your physician recommends that you schedule a follow-up appointment in: 3 months.  Any Other Special Instructions Will Be Listed Below (If Applicable).  If you need a refill on your cardiac medications before your next appointment, please call your pharmacy.

## 2016-03-30 ENCOUNTER — Other Ambulatory Visit: Payer: Self-pay | Admitting: Family Medicine

## 2016-03-30 DIAGNOSIS — Z1231 Encounter for screening mammogram for malignant neoplasm of breast: Secondary | ICD-10-CM

## 2016-04-08 ENCOUNTER — Ambulatory Visit (HOSPITAL_COMMUNITY)
Admission: RE | Admit: 2016-04-08 | Discharge: 2016-04-08 | Disposition: A | Discharge status: Home / Self Care | Payer: PPO | Source: Ambulatory Visit | Attending: Family Medicine | Admitting: Family Medicine

## 2016-04-08 DIAGNOSIS — Z1231 Encounter for screening mammogram for malignant neoplasm of breast: Secondary | ICD-10-CM | POA: Insufficient documentation

## 2016-04-23 ENCOUNTER — Ambulatory Visit (INDEPENDENT_AMBULATORY_CARE_PROVIDER_SITE_OTHER): Payer: PPO | Admitting: Nurse Practitioner

## 2016-04-23 ENCOUNTER — Encounter: Payer: Self-pay | Admitting: Nurse Practitioner

## 2016-04-23 VITALS — BP 120/82 | Ht 62.75 in | Wt 214.0 lb

## 2016-04-23 DIAGNOSIS — Z0000 Encounter for general adult medical examination without abnormal findings: Secondary | ICD-10-CM

## 2016-04-23 DIAGNOSIS — Z780 Asymptomatic menopausal state: Secondary | ICD-10-CM

## 2016-04-24 ENCOUNTER — Encounter: Payer: Self-pay | Admitting: Nurse Practitioner

## 2016-04-24 NOTE — Progress Notes (Addendum)
   Subjective:    Patient ID: Lacey Bailey, female    DOB: [birth year], 70 y.o.   MRN: [id number]  HPI presents for her wellness exam. Married, same sexual partner. Has had a hysterectomy, unsure if she's had a BSO. No pelvic pain. No discharge. Mammogram done on 8/16. Had a colonoscopy about 5 years ago. Sees urology and cardiology. Limited walking due to heart problems. Had her first dose of Pneumovax in October of last year at [facility]. Regular vision and dental exams.    Review of Systems  Constitutional: Positive for fatigue. Negative for activity change and appetite change.  HENT: Negative for dental problem, ear pain, sinus pressure and sore throat.   Respiratory: Positive for shortness of breath. Negative for cough, chest tightness and wheezing.   Cardiovascular: Negative for chest pain.  Gastrointestinal: Negative for abdominal distention, abdominal pain, constipation, diarrhea, nausea and vomiting.  Genitourinary: Positive for enuresis, frequency and urgency. Negative for difficulty urinating, dysuria, genital sores, pelvic pain and vaginal discharge.       Objective:   Physical Exam  Constitutional: She is oriented to person, place, and time. She appears well-developed. No distress.  HENT:  Right Ear: External ear normal.  Left Ear: External ear normal.  Mouth/Throat: Oropharynx is clear and moist.  Neck: Normal range of motion. Neck supple. No tracheal deviation present. No thyromegaly present.  Cardiovascular: Normal rate, regular rhythm and normal heart sounds.  Exam reveals no gallop.   No murmur heard. Pulmonary/Chest: Effort normal and breath sounds normal.  Abdominal: Soft. She exhibits no distension. There is no tenderness.  Genitourinary: Vagina normal. No vaginal discharge found.  Genitourinary Comments: External GU no rash or lesions. Vagina pale, no discharge. Bimanual exam no tenderness or obvious masses but exam limited due to abdominal girth. Rectal exam no  masses, no stool for Hemoccult. Patient was given Hemoccult cards in April.  Musculoskeletal: She exhibits no edema.  Lymphadenopathy:    She has no cervical adenopathy.  Neurological: She is alert and oriented to person, place, and time.  Skin: Skin is warm and dry. No rash noted.  Psychiatric: She has a normal mood and affect. Her behavior is normal.  Vitals reviewed.  Breast exam: A few areas of density noted, no obvious masses. Axillae no adenopathy.        Assessment & Plan:  Routine general medical examination at a health care facility  Post-menopausal - Plan: DG Bone Density  Hemoccult cards was given to patient in April. Lab work done in May. Encouraged activity as tolerated. Follow-up with Dr. Brett CanalesSteve as planned for routine visit. Recommend daily vitamin D and calcium supplementation.

## 2016-04-29 ENCOUNTER — Ambulatory Visit (HOSPITAL_COMMUNITY)
Admission: RE | Admit: 2016-04-29 | Discharge: 2016-04-29 | Disposition: A | Discharge status: Home / Self Care | Payer: PPO | Source: Ambulatory Visit | Attending: Nurse Practitioner | Admitting: Nurse Practitioner

## 2016-04-29 DIAGNOSIS — Z780 Asymptomatic menopausal state: Secondary | ICD-10-CM | POA: Diagnosis not present

## 2016-04-29 DIAGNOSIS — E2839 Other primary ovarian failure: Secondary | ICD-10-CM | POA: Diagnosis not present

## 2016-05-08 ENCOUNTER — Other Ambulatory Visit: Payer: Self-pay | Admitting: *Deleted

## 2016-05-08 MED ORDER — ISOSORBIDE MONONITRATE ER 30 MG PO TB24: 30.0000 mg | ORAL_TABLET | Freq: Every day | ORAL | 11 refills | Status: DC

## 2016-05-08 MED ORDER — PRAVASTATIN SODIUM 40 MG PO TABS: 40.0000 mg | ORAL_TABLET | Freq: Every day | ORAL | 11 refills | Status: DC

## 2016-06-03 ENCOUNTER — Other Ambulatory Visit: Payer: Self-pay | Admitting: Physician Assistant

## 2016-06-16 ENCOUNTER — Telehealth: Payer: Self-pay | Admitting: Cardiology

## 2016-06-16 NOTE — Telephone Encounter (Signed)
Patient called stating that on Sunday 06-14-16 she is stating that she is having episodes of dizziness feels like she is going to faint.. She states I just don;t feel well. No shortness of breath.

## 2016-06-17 ENCOUNTER — Ambulatory Visit (INDEPENDENT_AMBULATORY_CARE_PROVIDER_SITE_OTHER): Payer: PPO | Admitting: Family Medicine

## 2016-06-17 ENCOUNTER — Encounter: Payer: Self-pay | Admitting: Family Medicine

## 2016-06-17 VITALS — BP 120/74 | Ht 62.0 in

## 2016-06-17 DIAGNOSIS — I25812 Atherosclerosis of bypass graft of coronary artery of transplanted heart without angina pectoris: Secondary | ICD-10-CM

## 2016-06-17 DIAGNOSIS — H8110 Benign paroxysmal vertigo, unspecified ear: Secondary | ICD-10-CM

## 2016-06-17 DIAGNOSIS — Z8673 Personal history of transient ischemic attack (TIA), and cerebral infarction without residual deficits: Secondary | ICD-10-CM | POA: Diagnosis not present

## 2016-06-17 DIAGNOSIS — R42 Dizziness and giddiness: Secondary | ICD-10-CM | POA: Diagnosis not present

## 2016-06-17 DIAGNOSIS — I10 Essential (primary) hypertension: Secondary | ICD-10-CM

## 2016-06-17 MED ORDER — LEVOTHYROXINE SODIUM 75 MCG PO TABS: 75.0000 ug | ORAL_TABLET | Freq: Every day | ORAL | 1 refills | Status: DC

## 2016-06-17 MED ORDER — MECLIZINE HCL 25 MG PO TABS: 25.0000 mg | ORAL_TABLET | Freq: Three times a day (TID) | ORAL | 0 refills | Status: DC | PRN

## 2016-06-17 MED ORDER — PAROXETINE HCL 20 MG PO TABS: 20.0000 mg | ORAL_TABLET | Freq: Every day | ORAL | 1 refills | Status: DC

## 2016-06-17 MED ORDER — ONDANSETRON 4 MG PO TBDP: 4.0000 mg | ORAL_TABLET | Freq: Four times a day (QID) | ORAL | 0 refills | Status: DC | PRN

## 2016-06-17 NOTE — Progress Notes (Signed)
   Subjective:    Patient ID: Lacey Bailey, female    DOB: [birth year], 70 y.o.   MRN: [id number] Patient arrives office with ultimately acute symptoms potentially connected to chronic concerns Dizziness  This is a new problem. Episode onset: 3 days. Associated symptoms include nausea.  Patient has noted unsteadiness and dizziness. The last several days. True spinning sensation. Sometimes a little sometimes a lot of sometimes associated with nausea. No headache.  History of known coronary artery disease. Multivessel. Fairly severe. With ongoing angina despite angioplasty. Patient put: To a cardiologist but has not heard back from him since yesterday.  Also history of "mini stroke" [place] years ago [place].  Does have numerous risk factors for atherosclerotic disease.  Also had vertigo 20 years ago which required Epley's maneuvers intervention  Nauseated at times with it  True spinning ens at times  Unsteady having to steayd self whren walking  Feels funny in the head    Review of Systems  Gastrointestinal: Positive for nausea.  Neurological: Positive for dizziness.  No loss of consciousness no current exertional chest pain     Objective:   Physical Exam  Alert vital stable no acute distress HEENT normal fundi discharge neck supple lungs clear heart regular rate and rhythm. No cerebellar findings EKG normal sinus rhythm chronic anterolateral ST T changes consistent from earlier EKGs ankles without edema gait observed somewhat slightly unsteady  EKG is noted    Assessment & Plan:  Impression acute vertigo several days duration. Of note patient notes when turning head that it gets worse at times. Also negative cerebellar findings. Likely in her ear vertigo discuss. However with history of "mini stroke" in the past and numerous risk factors feel brain scanning is warranted. No headache no sudden onset we'll hold off on stat CT tonight. We'll do MRI rationale discussed with  patient 40 minutes spent with patient most in reviewing all recent labs current symptomatology probable diagnosis potential testing and intervention

## 2016-06-18 ENCOUNTER — Other Ambulatory Visit: Payer: Self-pay | Admitting: Family Medicine

## 2016-06-18 ENCOUNTER — Other Ambulatory Visit: Payer: Self-pay | Admitting: *Deleted

## 2016-06-18 NOTE — Telephone Encounter (Signed)
Clarify medication with patient may have refills if she is in fact on this 6 refills

## 2016-06-19 ENCOUNTER — Ambulatory Visit (HOSPITAL_COMMUNITY)
Admission: RE | Admit: 2016-06-19 | Discharge: 2016-06-19 | Disposition: A | Discharge status: Home / Self Care | Payer: PPO | Source: Ambulatory Visit | Attending: Family Medicine | Admitting: Family Medicine

## 2016-06-19 ENCOUNTER — Encounter (HOSPITAL_COMMUNITY): Payer: Self-pay

## 2016-06-19 DIAGNOSIS — I638 Other cerebral infarction: Secondary | ICD-10-CM | POA: Insufficient documentation

## 2016-06-19 DIAGNOSIS — R42 Dizziness and giddiness: Secondary | ICD-10-CM | POA: Insufficient documentation

## 2016-06-19 DIAGNOSIS — I6782 Cerebral ischemia: Secondary | ICD-10-CM | POA: Insufficient documentation

## 2016-06-22 ENCOUNTER — Telehealth: Payer: Self-pay | Admitting: Family Medicine

## 2016-06-22 MED ORDER — ONDANSETRON HCL 4 MG PO TABS: ORAL_TABLET | ORAL | 0 refills | Status: DC

## 2016-06-22 NOTE — Addendum Note (Signed)
Addended by: Theodora BlowREWS, Tc [place] R on: 06/22/2016 10:50 AM   Modules accepted: Orders

## 2016-06-22 NOTE — Telephone Encounter (Signed)
Medication changed and sent into pharmacy.

## 2016-06-22 NOTE — Telephone Encounter (Signed)
Received fax from [employer] patient's prescription coverage stating that the prior Authorization for Ondansetron ODT 4 MG tablet was denied. Please advise?

## 2016-06-22 NOTE — Telephone Encounter (Signed)
Change to non odt

## 2016-06-23 ENCOUNTER — Encounter: Payer: Self-pay | Admitting: Family Medicine

## 2016-06-23 ENCOUNTER — Encounter: Payer: Self-pay | Admitting: *Deleted

## 2016-06-23 NOTE — Progress Notes (Signed)
Cardiology Office Note  Date: 06/24/2016   ID: Lacey Bailey, DOB 12-Feb-1946, MRN [id number]  PCP: Lubertha SouthSteve Luking, MD  Primary Cardiologist: Nona DellSamuel McDowell, MD   Chief Complaint  Patient presents with  . Coronary Artery Disease    History of Present Illness: Lacey Bailey is a 70 y.o. female last seen in July. Interval records reviewed including recent visit with Dr. Gerda DissLuking. She had been experiencing dizziness that was felt to be most likely related to vertigo. Head MRI was also obtained by Dr. Gerda DissLuking, no acute findings noted.  From a cardiac perspective she reports intermittent angina symptoms, but overall relatively stable. We went over her cardiac medications and reports compliance. Overall, she seems to be satisfied with current symptom control, has to pace herself with activities but is trying to stay positive overall.  She has a history of multivessel CAD status post CABG in 2003 as well as [place], and most recently DES [facility] the posterolateral branch [facility]. She has occlusion of all vein grafts, patency of the LIMA to LAD, and also had moderate in-stent restenosis within the circumflex at the time of [place]. Most recent cardiac catheter[facility] in March demonstrated patent LIMA to LAD with otherwise occluded vein grafts, evidence of previous stenting within the RCA and circumflex. In-stent restenosis within the circumflex stent was treated with balloon angioplasty, unable to pass a stent.  Past Medical History:  Diagnosis Date  . Anginal pain (HCC)   . Aortic sclerosis   . CAD (coronary artery disease)    Multivessel status post CABG 2003 as well as multiple percutaneous interventions [place] DES to PLB 11/2014 ([facility])  . Depression   . Essential hypertension   . Hyperlipidemia   . Hypothyroidism   . Kidney stones   . TIA (transient ischemic attack) ~ 2013    Past Surgical  History:  Procedure Laterality Date  . ABDOMINAL HYSTERECTOMY    . APPENDECTOMY    . BLEPHAROPLASTY Bilateral    "uppers"  . CARDIAC CATHETERIZATION  11/22/2015  . CARDIAC CATHETERIZATION N/A 11/22/2015   Procedure: Left Heart Cath and Cors/Grafts Angiography;  Surgeon: Marykay Lexavid W Harding, MD;  [facility];  Service: Cardiovascular;  Laterality: N/A;  . CARDIAC CATHETERIZATION N/A 11/22/2015   Procedure: Coronary Stent Intervention;  Surgeon: Marykay Lexavid W Harding, MD;  [facility];  Service: Cardiovascular;  Laterality: N/A;  . CATARACT EXTRACTION, BILATERAL Bilateral   . CESAREAN SECTION  1977  . CORONARY ANGIOPLASTY WITH STENT PLACEMENT  "several"   "last one was 11/2014:  DES to the posterolateral branch /notes 11/20/2015  . CORONARY ARTERY BYPASS GRAFT  2003   [place]; CABG X 4  . DILATION AND CURETTAGE OF UTERUS    . KIDNEY STONE SURGERY     "opened me up"  . LAPAROSCOPIC CHOLECYSTECTOMY    . TONSILLECTOMY    . TUBAL LIGATION      Current Outpatient Prescriptions  Medication Sig Dispense Refill  . amLODipine (NORVASC) 5 MG tablet Take 1 tablet (5 mg total) by mouth daily. 90 tablet 3  . aspirin 81 MG tablet Take 81 mg by mouth daily.    . clopidogrel (PLAVIX) 75 MG tablet Take 1 tablet (75 mg total) by mouth daily. 90 tablet 3  . Ibuprofen-Diphenhydramine Cit (IBUPROFEN PM) 200-38 MG TABS Take 1 tablet by mouth daily as needed.    . isosorbide mononitrate (IMDUR)  30 MG 24 hr tablet Take 1 tablet (30 mg total) by mouth daily. 30 tablet 11  . levothyroxine (SYNTHROID, LEVOTHROID) 75 MCG tablet Take 1 tablet (75 mcg total) by mouth daily before breakfast. 90 tablet 1  . lisinopril (PRINIVIL,ZESTRIL) 20 MG tablet TAKE ONE TABLET BY MOUTH TWICE DAILY 180 tablet 3  . meclizine (ANTIVERT) 25 MG tablet Take 1 tablet (25 mg total) by mouth 3 (three) times daily as needed for dizziness. 30 tablet 0  . metoprolol succinate (TOPROL-XL) 25 MG 24 hr tablet TAKE 1/2 OF  A TABLET BY MOUTH EVERY EVENING - EMERGENCY REFILL FAXED DR 15 tablet 5  . mirabegron ER (MYRBETRIQ) 50 MG TB24 tablet Take 50 mg by mouth daily.    . ondansetron (ZOFRAN ODT) 4 MG disintegrating tablet Take 1 tablet (4 mg total) by mouth every 6 (six) hours as needed for nausea or vomiting. 20 tablet 0  . pantoprazole (PROTONIX) 40 MG tablet Take 1 tablet (40 mg total) by mouth daily. 90 tablet 3  . PARoxetine (PAXIL) 20 MG tablet Take 1 tablet (20 mg total) by mouth daily. 90 tablet 1  . pravastatin (PRAVACHOL) 40 MG tablet Take 1 tablet (40 mg total) by mouth daily. 30 tablet 11  . nitroGLYCERIN (NITROSTAT) 0.4 MG SL tablet Place 1 tablet (0.4 mg total) under the tongue every 5 (five) minutes as needed for chest pain. If no relief after the 3 rd dose, proceed to the ED for an evaluation 25 tablet 3   No current facility-administered medications for this visit.    Allergies:  Review of patient's allergies indicates no known allergies.   Social History: The patient  reports that she has never smoked. She has never used smokeless tobacco. She reports that she drinks alcohol. She reports that she does not use drugs.   ROS:  Please see the history of present illness. Otherwise, complete review of systems is positive for improving dizziness.  All other systems are reviewed and negative.   Physical Exam: VS:  BP 119/82   Pulse 68   Ht 5\' 2"  (1.575 m)   Wt 218 lb (98.9 kg)   BMI 39.87 kg/m , BMI Body mass index is 39.87 kg/m.  Wt Readings from Last 3 Encounters:  06/24/16 218 lb (98.9 kg)  04/23/16 214 lb (97.1 kg)  03/20/16 214 lb 9.6 oz (97.3 kg)    General: Overweight woman, appears comfortable at rest. HEENT: Conjunctiva and lids normal, oropharynx clear with moist mucosa. Neck: Supple, no elevated JVP or carotid bruits, no thyromegaly. Lungs: Clear to auscultation, nonlabored breathing at rest. Cardiac: Regular rate and rhythm, no S3 or significant systolic murmur, no pericardial  rub. Abdomen: Soft, nontender, bowel sounds present, no guarding or rebound. Extremities: No pitting edema, distal pulses 2+. Skin: Warm and dry. Musculoskeletal: No kyphosis. Neuropsychiatric: Alert and oriented x3, affect grossly appropriate.  ECG: I personally reviewed the tracing from 11/23/2015 which showed normal sinus rhythm with increased voltage and diffuse nonspecific ST-T changes.  Recent Labwork: 11/23/2015: Hemoglobin 10.6 12/24/2015: ALT 15; AST 16; BUN 17; Creatinine, Ser 1.20; Platelets 372; Potassium 5.0; Sodium 141; TSH 2.570     Component Value Date/Time   CHOL 180 12/24/2015 0917   TRIG 286 (H) 12/24/2015 0917   HDL 37 (L) 12/24/2015 0917   CHOLHDL 4.9 (H) 12/24/2015 0917   LDLCALC 86 12/24/2015 0917    Other Studies Reviewed Today:  Echocardiogram 10/17/2015: Study Conclusions  - Left ventricle: The cavity size was normal.  Wall thickness was  increased in a pattern of mild LVH. Systolic function was  vigorous. The estimated ejection fraction was in the range of 65%  to 70%. Wall motion was normal; there were no regional wall  motion abnormalities. - Aortic valve: Trileaflet; mildly calcified leaflets. Moderate  calcification involving the noncoronary cusp. - Mitral valve: Calcified annulus. There was trivial regurgitation. - Right atrium: Central venous pressure (est): 3 mm Hg. - Tricuspid valve: There was trivial regurgitation. - Pulmonary arteries: PA peak pressure: 35 mm Hg (S). - Pericardium, extracardiac: There was no pericardial effusion.  Impressions:  - Mild LVH with LVEF 65-70%. Grade 1 diastolic dysfunction with  equivocal LV filling pressure. MAC with trivial mitral  regurgitation. Moderately sclerotic aortic valve without  stenosis. Trivial tricuspid regurgitation with PASP estimated 35  mmHg.  Assessment and Plan:  1. Multivessel CAD status post CABG with subsequently documented graft disease and percutaneous interventions within  the RCA and circumflex distribution. She is most recently status post angioplasty due to in-stent restenosis within the circumflex in March of this year. She has stable angina symptoms and we will continue with medical therapy and observation.  2. Intermittent indigestion symptoms, she uses Tums as needed, also on Protonix.  3. Essential hypertension, blood pressure is well controlled today.  4. Hyperlipidemia, on Pravachol.  Current medicines were reviewed with the patient today.  Disposition: Follow-up in 6 months.  Signed, Jonelle SidleSamuel G. McDowell, MD, [facility] 06/24/2016 11:44 AM    [facility] [address], [place], [place] [address] Phone: [phone]; Fax: [phone]

## 2016-06-24 ENCOUNTER — Ambulatory Visit (INDEPENDENT_AMBULATORY_CARE_PROVIDER_SITE_OTHER): Payer: PPO | Admitting: Cardiology

## 2016-06-24 ENCOUNTER — Encounter: Payer: Self-pay | Admitting: Cardiology

## 2016-06-24 VITALS — BP 119/82 | HR 68 | Ht 62.0 in | Wt 218.0 lb

## 2016-06-24 DIAGNOSIS — K219 Gastro-esophageal reflux disease without esophagitis: Secondary | ICD-10-CM

## 2016-06-24 DIAGNOSIS — I10 Essential (primary) hypertension: Secondary | ICD-10-CM | POA: Diagnosis not present

## 2016-06-24 DIAGNOSIS — Z23 Encounter for immunization: Secondary | ICD-10-CM

## 2016-06-24 DIAGNOSIS — E782 Mixed hyperlipidemia: Secondary | ICD-10-CM | POA: Diagnosis not present

## 2016-06-24 DIAGNOSIS — I25119 Atherosclerotic heart disease of native coronary artery with unspecified angina pectoris: Secondary | ICD-10-CM

## 2016-06-24 NOTE — Patient Instructions (Signed)

## 2016-08-05 ENCOUNTER — Ambulatory Visit: Payer: Self-pay | Admitting: Urology

## 2016-09-14 ENCOUNTER — Other Ambulatory Visit: Payer: Self-pay | Admitting: *Deleted

## 2016-09-14 MED ORDER — NITROGLYCERIN 0.4 MG SL SUBL: 0.4000 mg | SUBLINGUAL_TABLET | SUBLINGUAL | 3 refills | Status: DC | PRN

## 2016-12-13 ENCOUNTER — Other Ambulatory Visit: Payer: Self-pay | Admitting: Family Medicine

## 2016-12-14 ENCOUNTER — Other Ambulatory Visit: Payer: Self-pay | Admitting: Family Medicine

## 2016-12-17 NOTE — Progress Notes (Signed)
Cardiology Office Note  Date: 12/21/2016   ID: Lacey, Bailey 1946/02/25, MRN [id number]  PCP: Mickie Hillier, MD  Primary Cardiologist: Rozann Lesches, MD   Chief Complaint  Patient presents with  . Coronary Artery Disease    History of Present Illness: Lacey Bailey is a 71 y.o. female last seen in November 2017. She is here with her husband today for a routine follow-up visit. Fortunately, she has been doing very well without any significant angina symptoms on current medical regimen. She has been enjoying quilting over the winter, does some low-level walking for exercise but does have to pace herself.  She has a history of multivessel CAD status post CABG in 2003 as well as multiple PCI's previously in [place], and most recently DES to the posterolateral branch in April 2016 at [facility]. She has occlusion of all vein grafts, patency of the LIMA to LAD, and also had moderate in-stent restenosis within the circumflex at the time of her [place] intervention. Most recent cardiac catheterization at [facility] in March 2017 demonstrated patent LIMA to LAD with otherwise occluded vein grafts, evidence of previous stenting within the RCA and circumflex. In-stent restenosis within the circumflex stent was treated with balloon angioplasty, unable to pass a stent.  I reviewed her current medications which are stable from a cardiac perspective and outlined below.  Past Medical History:  Diagnosis Date  . Anginal pain ([place])   . Aortic sclerosis   . CAD (coronary artery disease)    Multivessel status post CABG 2003 as well as multiple percutaneous interventions [place]acey Bailey [place]), DES to PLB 11/2014 ([facility])  . Depression   . Essential hypertension   . Hyperlipidemia   . Hypothyroidism   . Kidney stones   . TIA (transient ischemic attack) ~ 2013    Past Surgical History:  Procedure Laterality Date  . ABDOMINAL HYSTERECTOMY    . APPENDECTOMY    . BLEPHAROPLASTY Bilateral      "uppers"  . CARDIAC CATHETERIZATION  11/22/2015  . CARDIAC CATHETERIZATION N/A 11/22/2015   Procedure: Left Heart Cath and Cors/Grafts Angiography;  Surgeon: Leonie Man, MD;  Location: [place] CV LAB;  Service: Cardiovascular;  Laterality: N/A;  . CARDIAC CATHETERIZATION N/A 11/22/2015   Procedure: Coronary Stent Intervention;  Surgeon: Leonie Man, MD;  Location: [place] CV LAB;  Service: Cardiovascular;  Laterality: N/A;  . CATARACT EXTRACTION, BILATERAL Bilateral   . CESAREAN SECTION  1977  . CORONARY ANGIOPLASTY WITH STENT PLACEMENT  "several"   "last one was 11/2014:  DES to the posterolateral branch /notes 11/20/2015  . CORONARY ARTERY BYPASS GRAFT  2003   [place]; CABG X 4  . DILATION AND CURETTAGE OF UTERUS    . KIDNEY STONE SURGERY     "opened me up"  . LAPAROSCOPIC CHOLECYSTECTOMY    . TONSILLECTOMY    . TUBAL LIGATION      Current Outpatient Prescriptions  Medication Sig Dispense Refill  . amLODipine (NORVASC) 5 MG tablet Take 1 tablet (5 mg total) by mouth daily. 90 tablet 3  . aspirin 81 MG tablet Take 81 mg by mouth daily.    . clopidogrel (PLAVIX) 75 MG tablet Take 1 tablet (75 mg total) by mouth daily. 90 tablet 3  . Ibuprofen-Diphenhydramine Cit (IBUPROFEN PM) 200-38 MG TABS Take 1 tablet by mouth at bedtime as needed.     . isosorbide mononitrate (IMDUR) 30 MG 24 hr tablet Take 1 tablet (30 mg total) by mouth  daily. 30 tablet 11  . levothyroxine (SYNTHROID, LEVOTHROID) 75 MCG tablet TAKE 1 TABLET BY MOUTH DAILY BEFORE BREAKFAST. 90 tablet 0  . lisinopril (PRINIVIL,ZESTRIL) 20 MG tablet TAKE ONE TABLET BY MOUTH TWICE DAILY 180 tablet 3  . meclizine (ANTIVERT) 25 MG tablet Take 1 tablet (25 mg total) by mouth 3 (three) times daily as needed for dizziness. 30 tablet 0  . metoprolol succinate (TOPROL-XL) 25 MG 24 hr tablet TAKE 1/2 OF A TABLET BY MOUTH EVERY EVENING 15 tablet 0  . nitroGLYCERIN (NITROSTAT) 0.4 MG SL tablet Place 1 tablet (0.4 mg total)  under the tongue every 5 (five) minutes as needed for chest pain. If no relief after the 3 rd dose, proceed to the ED for an evaluation 25 tablet 3  . ondansetron (ZOFRAN ODT) 4 MG disintegrating tablet Take 1 tablet (4 mg total) by mouth every 6 (six) hours as needed for nausea or vomiting. 20 tablet 0  . pantoprazole (PROTONIX) 40 MG tablet Take 1 tablet (40 mg total) by mouth daily. 90 tablet 3  . PARoxetine (PAXIL) 20 MG tablet Take 1 tablet (20 mg total) by mouth daily. 90 tablet 1  . pravastatin (PRAVACHOL) 40 MG tablet Take 1 tablet (40 mg total) by mouth daily. 30 tablet 11   No current facility-administered medications for this visit.    Allergies:  Patient has no known allergies.   Social History: The patient  reports that she has never smoked. She has never used smokeless tobacco. She reports that she drinks alcohol. She reports that she does not use drugs.   ROS:  Please see the history of present illness. Otherwise, complete review of systems is positive for arthritic stiffness.  All other systems are reviewed and negative.   Physical Exam: VS:  BP 118/80   Pulse (!) 58   Ht _0  (1.575 m)   Wt 218 lb 6.4 oz (99.1 kg)   SpO2 99%   BMI 39.95 kg/m , BMI Body mass index is 39.95 kg/m.  Wt Readings from Last 3 Encounters:  12/21/16 218 lb 6.4 oz (99.1 kg)  06/24/16 218 lb (98.9 kg)  04/23/16 214 lb (97.1 kg)    General: Overweight woman,appears comfortable at rest. HEENT: Conjunctiva and lids normal, oropharynx clear with moist mucosa. Neck: Supple, no elevated JVP or carotid bruits, no thyromegaly. Lungs: Clear to auscultation, nonlabored breathing at rest. Cardiac: Regular rate and rhythm, no S3 or significant systolic murmur, no pericardial rub. Abdomen: Soft, nontender, bowel sounds present, no guarding or rebound. Extremities: No pitting edema, distal pulses 2+. Skin: Warm and dry. Musculoskeletal: No kyphosis. Neuropsychiatric: Alert and oriented x3, affect  grossly appropriate.  ECG: I personally reviewed the tracing from 06/23/2016 which showed sinus rhythm with R' in lead V1, diffuse nonspecific ST-T wave abnormalities consistent with ischemic heart disease.  Recent Labwork: 12/24/2015: ALT 15; AST 16; BUN 17; Creatinine, Ser 1.20; Platelets 372; Potassium 5.0; Sodium 141; TSH 2.570     Component Value Date/Time   CHOL 180 12/24/2015 0917   TRIG 286 (H) 12/24/2015 0917   HDL 37 (L) 12/24/2015 0917   CHOLHDL 4.9 (H) 12/24/2015 0917   LDLCALC 86 12/24/2015 0917    Other Studies Reviewed Today:  Echocardiogram 10/17/2015: Study Conclusions  - Left ventricle: The cavity size was normal. Wall thickness was  increased in a pattern of mild LVH. Systolic function was  vigorous. The estimated ejection fraction was in the range of 65%  to 70%. Wall motion was  normal; there were no regional wall  motion abnormalities. - Aortic valve: Trileaflet; mildly calcified leaflets. Moderate  calcification involving the noncoronary cusp. - Mitral valve: Calcified annulus. There was trivial regurgitation. - Right atrium: Central venous pressure (est): 3 mm Hg. - Tricuspid valve: There was trivial regurgitation. - Pulmonary arteries: PA peak pressure: 35 mm Hg (S). - Pericardium, extracardiac: There was no pericardial effusion.  Impressions:  - Mild LVH with LVEF 65-70%. Grade 1 diastolic dysfunction with  equivocal LV filling pressure. MAC with trivial mitral  regurgitation. Moderately sclerotic aortic valve without  stenosis. Trivial tricuspid regurgitation with PASP estimated 35  mmHg.  Assessment and Plan:  1. Symptomatically stable multivessel CAD status post CABG and subsequent percutaneous interventions. She has graft disease as outlined above. At this point doing fairly well in terms of angina control on medications, no changes were made. Encouraged walking regimen for exercise as tolerated. She has NYHA class 2-3 dyspnea depending  on distance.  2. Essential hypertension, blood pressure well controlled today.  3. Hyperlipidemia, on Pravachol. Last LDL 86. She follows with Dr. Wolfgang Phoenix.  4. Hypothyroidism, on Synthroid. Last TSH normal.  Current medicines were reviewed with the patient today.  Disposition: Follow-up in 6 months.  Signed, Satira Sark, MD, [facility] 12/21/2016 11:16 AM    [facility] [place], [place], [place] [address] Phone: [phone]; Fax: [phone]

## 2016-12-18 ENCOUNTER — Other Ambulatory Visit: Payer: Self-pay | Admitting: Family Medicine

## 2016-12-21 ENCOUNTER — Encounter: Payer: Self-pay | Admitting: Cardiology

## 2016-12-21 ENCOUNTER — Ambulatory Visit (INDEPENDENT_AMBULATORY_CARE_PROVIDER_SITE_OTHER): Payer: PPO | Admitting: Cardiology

## 2016-12-21 VITALS — BP 118/80 | HR 58 | Ht 62.0 in | Wt 218.4 lb

## 2016-12-21 DIAGNOSIS — I10 Essential (primary) hypertension: Secondary | ICD-10-CM

## 2016-12-21 DIAGNOSIS — I25119 Atherosclerotic heart disease of native coronary artery with unspecified angina pectoris: Secondary | ICD-10-CM | POA: Diagnosis not present

## 2016-12-21 DIAGNOSIS — E782 Mixed hyperlipidemia: Secondary | ICD-10-CM | POA: Diagnosis not present

## 2016-12-21 DIAGNOSIS — E039 Hypothyroidism, unspecified: Secondary | ICD-10-CM | POA: Diagnosis not present

## 2016-12-21 NOTE — Telephone Encounter (Signed)
Last seen 06/17/16

## 2016-12-21 NOTE — Patient Instructions (Signed)

## 2016-12-21 NOTE — Telephone Encounter (Signed)
30 tabs ok six mo visit due

## 2016-12-30 ENCOUNTER — Other Ambulatory Visit: Payer: Self-pay | Admitting: *Deleted

## 2016-12-30 MED ORDER — LEVOTHYROXINE SODIUM 75 MCG PO TABS: ORAL_TABLET | ORAL | 0 refills | Status: DC

## 2017-01-11 ENCOUNTER — Other Ambulatory Visit: Payer: Self-pay | Admitting: Family Medicine

## 2017-01-20 ENCOUNTER — Other Ambulatory Visit: Payer: Self-pay | Admitting: Family Medicine

## 2017-01-20 NOTE — Telephone Encounter (Signed)
Thirty d worth, needs o v

## 2017-01-20 NOTE — Telephone Encounter (Signed)
Dr. Steves patient 

## 2017-01-20 NOTE — Telephone Encounter (Signed)
Last seen October 2017 

## 2017-02-09 ENCOUNTER — Other Ambulatory Visit: Payer: Self-pay | Admitting: Family Medicine

## 2017-02-09 DIAGNOSIS — H26491 Other secondary cataract, right eye: Secondary | ICD-10-CM | POA: Diagnosis not present

## 2017-02-09 DIAGNOSIS — H04123 Dry eye syndrome of bilateral lacrimal glands: Secondary | ICD-10-CM | POA: Diagnosis not present

## 2017-02-09 DIAGNOSIS — Z961 Presence of intraocular lens: Secondary | ICD-10-CM | POA: Diagnosis not present

## 2017-02-09 DIAGNOSIS — H16223 Keratoconjunctivitis sicca, not specified as Sjogren's, bilateral: Secondary | ICD-10-CM | POA: Diagnosis not present

## 2017-02-09 DIAGNOSIS — H35363 Drusen (degenerative) of macula, bilateral: Secondary | ICD-10-CM | POA: Diagnosis not present

## 2017-02-09 DIAGNOSIS — H524 Presbyopia: Secondary | ICD-10-CM | POA: Diagnosis not present

## 2017-02-09 DIAGNOSIS — Z9842 Cataract extraction status, left eye: Secondary | ICD-10-CM | POA: Diagnosis not present

## 2017-02-10 ENCOUNTER — Other Ambulatory Visit: Payer: Self-pay | Admitting: Family Medicine

## 2017-02-10 NOTE — Telephone Encounter (Signed)
30 d only needs o v 

## 2017-02-10 NOTE — Telephone Encounter (Signed)
Patient last seen 06/17/16

## 2017-02-10 NOTE — Telephone Encounter (Signed)
Last seen 06/17/2016

## 2017-03-01 ENCOUNTER — Other Ambulatory Visit: Payer: Self-pay | Admitting: Cardiology

## 2017-03-10 ENCOUNTER — Other Ambulatory Visit: Payer: Self-pay | Admitting: Family Medicine

## 2017-03-11 NOTE — Telephone Encounter (Signed)
30 d only time for chronic o v

## 2017-03-22 ENCOUNTER — Telehealth: Payer: Self-pay | Admitting: Nurse Practitioner

## 2017-03-22 ENCOUNTER — Other Ambulatory Visit: Payer: Self-pay | Admitting: Nurse Practitioner

## 2017-03-22 ENCOUNTER — Other Ambulatory Visit: Payer: Self-pay | Admitting: Family Medicine

## 2017-03-22 DIAGNOSIS — E785 Hyperlipidemia, unspecified: Secondary | ICD-10-CM

## 2017-03-22 DIAGNOSIS — Z79899 Other long term (current) drug therapy: Secondary | ICD-10-CM

## 2017-03-22 DIAGNOSIS — Z1231 Encounter for screening mammogram for malignant neoplasm of breast: Secondary | ICD-10-CM

## 2017-03-22 DIAGNOSIS — E039 Hypothyroidism, unspecified: Secondary | ICD-10-CM

## 2017-03-22 DIAGNOSIS — R5383 Other fatigue: Secondary | ICD-10-CM

## 2017-03-22 DIAGNOSIS — I10 Essential (primary) hypertension: Secondary | ICD-10-CM

## 2017-03-22 NOTE — Telephone Encounter (Signed)
Patient has an appointment on 04/28/17 with Lacey Bailey.  She is requesting order for labs.

## 2017-03-22 NOTE — Telephone Encounter (Signed)
done

## 2017-03-23 NOTE — Telephone Encounter (Signed)
Patient notified

## 2017-04-08 ENCOUNTER — Other Ambulatory Visit: Payer: Self-pay | Admitting: Family Medicine

## 2017-04-09 ENCOUNTER — Ambulatory Visit (HOSPITAL_COMMUNITY): Payer: Self-pay

## 2017-04-20 ENCOUNTER — Other Ambulatory Visit (HOSPITAL_COMMUNITY)
Admission: RE | Admit: 2017-04-20 | Discharge: 2017-04-20 | Disposition: A | Discharge status: Home / Self Care | Payer: PPO | Source: Ambulatory Visit | Attending: Nurse Practitioner | Admitting: Nurse Practitioner

## 2017-04-20 DIAGNOSIS — E785 Hyperlipidemia, unspecified: Secondary | ICD-10-CM | POA: Diagnosis not present

## 2017-04-20 DIAGNOSIS — I10 Essential (primary) hypertension: Secondary | ICD-10-CM | POA: Insufficient documentation

## 2017-04-20 DIAGNOSIS — E039 Hypothyroidism, unspecified: Secondary | ICD-10-CM | POA: Diagnosis not present

## 2017-04-20 DIAGNOSIS — R5383 Other fatigue: Secondary | ICD-10-CM | POA: Diagnosis not present

## 2017-04-20 DIAGNOSIS — Z79899 Other long term (current) drug therapy: Secondary | ICD-10-CM | POA: Diagnosis not present

## 2017-04-20 LAB — BASIC METABOLIC PANEL
Anion gap: 9 (ref 5–15)
BUN: 16 mg/dL (ref 6–20)
CHLORIDE: 103 mmol/L (ref 101–111)
CO2: 26 mmol/L (ref 22–32)
CREATININE: 1.31 mg/dL — AB (ref 0.44–1.00)
Calcium: 9.6 mg/dL (ref 8.9–10.3)
GFR calc non Af Amer: 40 mL/min — ABNORMAL LOW (ref >60)
GFR, EST AFRICAN AMERICAN: 46 mL/min — AB (ref >60)
GLUCOSE: 101 mg/dL — AB (ref 65–99)
Potassium: 3.9 mmol/L (ref 3.5–5.1)
Sodium: 138 mmol/L (ref 135–145)

## 2017-04-20 LAB — LIPID PANEL
CHOL/HDL RATIO: 6 ratio
Cholesterol: 209 mg/dL — ABNORMAL HIGH (ref 0–200)
HDL: 35 mg/dL — AB (ref >40)
LDL Cholesterol: 105 mg/dL — ABNORMAL HIGH (ref 0–99)
Triglycerides: 343 mg/dL — ABNORMAL HIGH (ref <150)
VLDL: 69 mg/dL — AB (ref 0–40)

## 2017-04-20 LAB — CBC WITH DIFFERENTIAL/PLATELET
BASOS ABS: 0.1 10*3/uL (ref 0.0–0.1)
BASOS PCT: 1 %
EOS ABS: 0.2 10*3/uL (ref 0.0–0.7)
EOS PCT: 3 %
HCT: 39.9 % (ref 36.0–46.0)
Hemoglobin: 13.4 g/dL (ref 12.0–15.0)
LYMPHS ABS: 1.8 10*3/uL (ref 0.7–4.0)
Lymphocytes Relative: 24 %
MCH: 30.2 pg (ref 26.0–34.0)
MCHC: 33.6 g/dL (ref 30.0–36.0)
MCV: 89.9 fL (ref 78.0–100.0)
Monocytes Absolute: 0.3 10*3/uL (ref 0.1–1.0)
Monocytes Relative: 4 %
NEUTROS PCT: 68 %
Neutro Abs: 5 10*3/uL (ref 1.7–7.7)
PLATELETS: 344 10*3/uL (ref 150–400)
RBC: 4.44 MIL/uL (ref 3.87–5.11)
RDW: 13.5 % (ref 11.5–15.5)
WBC: 7.4 10*3/uL (ref 4.0–10.5)

## 2017-04-20 LAB — HEPATIC FUNCTION PANEL
ALBUMIN: 4.6 g/dL (ref 3.5–5.0)
ALK PHOS: 80 U/L (ref 38–126)
ALT: 16 U/L (ref 14–54)
AST: 19 U/L (ref 15–41)
Bilirubin, Direct: <0.1 mg/dL — ABNORMAL LOW (ref 0.1–0.5)
TOTAL PROTEIN: 7.7 g/dL (ref 6.5–8.1)
Total Bilirubin: 0.8 mg/dL (ref 0.3–1.2)

## 2017-04-20 LAB — TSH: TSH: 1.673 u[IU]/mL (ref 0.350–4.500)

## 2017-04-28 ENCOUNTER — Ambulatory Visit (INDEPENDENT_AMBULATORY_CARE_PROVIDER_SITE_OTHER): Payer: PPO | Admitting: Nurse Practitioner

## 2017-04-28 ENCOUNTER — Encounter: Payer: Self-pay | Admitting: Nurse Practitioner

## 2017-04-28 ENCOUNTER — Other Ambulatory Visit: Payer: Self-pay | Admitting: Cardiology

## 2017-04-28 VITALS — BP 124/80 | Ht 62.0 in | Wt 217.0 lb

## 2017-04-28 DIAGNOSIS — E039 Hypothyroidism, unspecified: Secondary | ICD-10-CM | POA: Diagnosis not present

## 2017-04-28 DIAGNOSIS — E785 Hyperlipidemia, unspecified: Secondary | ICD-10-CM

## 2017-04-28 DIAGNOSIS — F32A Depression, unspecified: Secondary | ICD-10-CM

## 2017-04-28 DIAGNOSIS — R5383 Other fatigue: Secondary | ICD-10-CM

## 2017-04-28 DIAGNOSIS — F329 Major depressive disorder, single episode, unspecified: Secondary | ICD-10-CM

## 2017-04-28 DIAGNOSIS — Z01419 Encounter for gynecological examination (general) (routine) without abnormal findings: Secondary | ICD-10-CM

## 2017-04-28 MED ORDER — ROSUVASTATIN CALCIUM 20 MG PO TABS: 20.0000 mg | ORAL_TABLET | Freq: Every day | ORAL | 0 refills | Status: DC

## 2017-04-28 NOTE — Patient Instructions (Addendum)
Increase Paxil to 1 1/2 tabs (30 mg)  Stop Pravastatin and start Rosuvastatin for cholesterol. You will need to have your lipids and liver enzymes (blood work) checked in about 8 weeks after starting the Rosuvastatin.  Appointment with Cardiology moved to Friday May 07, 2017 at 2:00 pm with Phoebe SharpsKathryn Lawarence, NP in Dr. Ival BibleMcDowell's office.   Check to see if you have received your Prevnar 13 pneumonia vaccine.   An over the counter stool softener may be used to help with occasional constipation or hard stools.  Follow directions on the bottle for use.

## 2017-04-28 NOTE — Progress Notes (Addendum)
Subjective:    Patient ID: Lacey Bailey, female    DOB: 1946-03-11, 71 y.o.   MRN: [id number]  HPI: 71 y/o F presents today for annual wellness visit.  States that she has been feeling more fatigued than usual leading to a decline in ability to perform activities she enjoys and restricts ability to help out around the house. Admits declining health status is causing an increase in guilt and sadness. Reports depressive symptoms bother her and are leading to some social isolation. Denies suicidal or homicidal ideations.   Reports chest "heaviness" with both activity and rest for the past "couple of months". She denies chest pain or pressure today. Denies nausea, shortness of breath, palpitations, syncope/near syncope, jaw pain, shoulder pain, back pain, diaphoresis, or peripheral edema. Pt followed by Dr. Diona Browner for cardiac care and reports an appointment scheduled in October.  Reports ongoing stress incontinence and leaking of urine with daily activities. Denies dysuria, hematuria, fever, flank pain, or odor. Pt followed by Dr. Ronne Binning for urological issues.  Regular vision and dental exams.   Review of Systems- see HPI for pertinent positive and negatives.     Objective:   Physical Exam  Constitutional:  Well developed, pleasant female appearing stated age. Vital signs reviewed and WNL  HENT:  Mouth/Throat: Mucous membranes are normal.  Head is normocephalic with tenderness or visible or palpable masses. Hair texture is normal and evenly distributed.   The pinna, tragus, and ear canal are intact and non-tender. Ear canal is clear without discharge bilaterally. TM appears normal with positive cone of light.Hearing is intact with good acuity bilaterally. Nasal mucosa is pink and moist. The nasal septum is midline. Nares are patent bilaterally. Oral mucosa is pink and moist with good dentition. Tongue normal in appearance without lesions and with good symmetrical movement. No buccal nodules  or lesions are noted. The pharynx is normal in appearance without tonsillar swelling or exudates. No adenopathy is noted.   Eyes:  Conjunctivae are clear without exudates or hemorrhage. Sclera is non-icteric. PERRLA. EOM are intact. Corrective lenses are in place.   Neck:  Neck is supple with no masses, tenderness, or cervical adenopathy present.  Thyroid is palpable with no nodularity or tenderness.  No JVD noted   Cardiovascular:  Cardiac rate and rhythm regular. Radial and DP pulses +2 bilaterally. Capillary refill <3 sec bilaterally on upper and lower extremities. No pedal edema noted.  Pulmonary/Chest:  Respirations regular and unlabored with no signs of respiratory distress. Lungs clear bilaterally throughout the lung fields.   Breasts equal in size and shape bilaterally, no deformity or skin changes noted on inspection. No masses, tenderness, nodularity, edema, or nipple discharge noted on palpation bilaterally. Well healed surgical scar at midline. No axillary adenopathy present.  Abdominal:  Abdomen soft, obese, non tender without evidence of distention, tenderness, masses, or guarding.  Well healed surgical scar noted midline from chest wall to epigastric region. Umbilicus midline without herniation. Bowel sounds are present and normoactive in all four quadrants.  Genitourinary:  Genitourinary Comments: Labia are intact with no evidence of edema, lesion, or tenderness. Pubic hair growth and distribution appropriate for age. Mild erythema noted at vaginal introitus. No vaginal tenderness, bleeding, or discharge.   Musculoskeletal:  Age related decreased ROM and strength to trunk and extremities. No edema, pain, or tenderness to joints.   Skin:  Skin warm, dry, and intact. Appx 1cm x 1cm seseborrheic keratosis at 2 o'clock position of upper back. Scattered nevi, ephelides,  and cherry angiomas across the skin surface- none appearing >0.5cm- with even coloration and no irregular boarders  detected.   Psychiatric:  Alert and oriented to person, place, time, and situation. Speech and memory are normal with intact thought processes. Pt is emotional and tearful intermittently throughout the encounter.  Vitals reviewed.  Results for orders placed or performed during the hospital encounter of 04/20/17  Basic metabolic panel  Result Value Ref Range   Sodium 138 135 - 145 mmol/L   Potassium 3.9 3.5 - 5.1 mmol/L   Chloride 103 101 - 111 mmol/L   CO2 26 22 - 32 mmol/L   Glucose, Bld 101 (H) 65 - 99 mg/dL   BUN 16 6 - 20 mg/dL   Creatinine, Ser 1.61 (H) 0.44 - 1.00 mg/dL   Calcium 9.6 8.9 - 09.6 mg/dL   GFR calc non Af Amer 40 (L) >60 mL/min   GFR calc Af Amer 46 (L) >60 mL/min   Anion gap 9 5 - 15  CBC with Differential/Platelet  Result Value Ref Range   WBC 7.4 4.0 - 10.5 K/uL   RBC 4.44 3.87 - 5.11 MIL/uL   Hemoglobin 13.4 12.0 - 15.0 g/dL   HCT 04.5 40.9 - 81.1 %   MCV 89.9 78.0 - 100.0 fL   MCH 30.2 26.0 - 34.0 pg   MCHC 33.6 30.0 - 36.0 g/dL   RDW 91.4 78.2 - 95.6 %   Platelets 344 150 - 400 K/uL   Neutrophils Relative % 68 %   Neutro Abs 5.0 1.7 - 7.7 K/uL   Lymphocytes Relative 24 %   Lymphs Abs 1.8 0.7 - 4.0 K/uL   Monocytes Relative 4 %   Monocytes Absolute 0.3 0.1 - 1.0 K/uL   Eosinophils Relative 3 %   Eosinophils Absolute 0.2 0.0 - 0.7 K/uL   Basophils Relative 1 %   Basophils Absolute 0.1 0.0 - 0.1 K/uL  Hepatic function panel  Result Value Ref Range   Total Protein 7.7 6.5 - 8.1 g/dL   Albumin 4.6 3.5 - 5.0 g/dL   AST 19 15 - 41 U/L   ALT 16 14 - 54 U/L   Alkaline Phosphatase 80 38 - 126 U/L   Total Bilirubin 0.8 0.3 - 1.2 mg/dL   Bilirubin, Direct <2.1 (L) 0.1 - 0.5 mg/dL   Indirect Bilirubin NOT CALCULATED 0.3 - 0.9 mg/dL  Lipid panel  Result Value Ref Range   Cholesterol 209 (H) 0 - 200 mg/dL   Triglycerides 308 (H) <150 mg/dL   HDL 35 (L) >65 mg/dL   Total CHOL/HDL Ratio 6.0 RATIO   VLDL 69 (H) 0 - 40 mg/dL   LDL Cholesterol 784 (H) 0 -  99 mg/dL  TSH  Result Value Ref Range   TSH 1.673 0.350 - 4.500 uIU/mL   Depression screen PHQ 2/9 04/30/2017  Decreased Interest 1  Down, Depressed, Hopeless 1  PHQ - 2 Score 2  Altered sleeping 2  Tired, decreased energy 3  Change in appetite 0  Feeling bad or failure about yourself  1  Trouble concentrating 0  Moving slowly or fidgety/restless 0  Suicidal thoughts 0  PHQ-9 Score 8  Difficult doing work/chores Somewhat difficult      Assessment & Plan:  1. Well woman exam  2. Depression, unspecified depression type - Increase paroxetine dose from 20mg  every day to 30mg  (take 1 1/2 tab).  3. Other fatigue - Appointment with cardiology moved to 9/14 for evaluation.  4. Hyperlipidemia  with target LDL less than 70 - Labs reviewed with patient  - Stop taking pravastatin and begin taking rosuvastatin  Repeat liver and lipid profiles in 8-10 weeks.   5. Hypothyroidism, unspecified type - Labs reviewed with patient.  - Continue levothyroxine as prescribed  6. Patient will check with pharmacy to see if she has had Prevnar 13 since turning 63. Reminded about flu vaccine.   Return in about 6 months (around 10/26/2017) for recheck or sooner if needed.

## 2017-05-03 ENCOUNTER — Telehealth: Payer: Self-pay | Admitting: Nurse Practitioner

## 2017-05-03 DIAGNOSIS — Z13220 Encounter for screening for lipoid disorders: Secondary | ICD-10-CM

## 2017-05-03 DIAGNOSIS — Z79899 Other long term (current) drug therapy: Secondary | ICD-10-CM

## 2017-05-03 NOTE — Telephone Encounter (Signed)
Please order lipid and liver profiles to be done about 8-10 weeks after starting new cholesterol med and let patient know. Also remind patient to get her flu vaccine. Thanks.

## 2017-05-03 NOTE — Telephone Encounter (Signed)
Pt made aware of all. Orders for labs mailed to the pt to be done 8-10 wks from 05/03/2017. She was also told to pls follow up with getting flu vaccination.

## 2017-05-05 ENCOUNTER — Encounter: Payer: Self-pay | Admitting: Nurse Practitioner

## 2017-05-06 NOTE — Progress Notes (Signed)
[id number] Office Note   Date:  05/07/2017   ID:  Lacey Bailey, DOB 11-Sep-1945, MRN [id number]  PCP:  Merlyn AlbertLuking, William S, MD  Cardiologist:  Diona BrownerMcDowell  Chief Complaint  Patient presents with  . Coronary Artery Disease  . Hyperlipidemia      History of Present Illness: Lacey Bailey is a 71 y.o. female who presents for ongoing assessment and management of coronary artery disease, status post coronary artery bypass grafting in 2003, will PCI's while living in [place], with most recent DES in the posterior lateral branch [facility]. Most recent cardiac catheterization revealed patency of the LIMA to LAD but moderate in-stent restenosis within the circumflex at the time of the intervention.   She was last seen by Dr. Diona BrownerMcDowell on 12/21/2016, she continue with New York Heart Association Class II  dyspnea and depending on distance. She did not complain of chest pain. Blood pressure was well-controlled. No changes remained in her medication.  Has been seen by primary care provider, Sherie Donarolyn Hoskins, DNP, and had pravastatin changed to rosuvastatin with a repeat lipid profile 8-10 weeks. Paroxetine was increased from 20 mg a day to 30 mg a day.  She comes today with worsening symptoms of dyspnea and chest pressure. She is limiting her activities as a result and lays on the couch a lot. She is easily tearful and talks at length about her fatigue, lack of energy and depression associated with her limitations.   Past Medical History:  Diagnosis Date  . Anginal pain (HCC)   . Aortic sclerosis   . CAD (coronary artery disease)    Multivessel status post CABG 2003 as well as multiple percutaneous interventions Sharma Covert([place]), DES to PLB 11/2014 ([facility])  . Depression   . Essential hypertension   . Hyperlipidemia   . Hypothyroidism   . Kidney stones   . TIA (transient ischemic attack) ~ 2013    Past Surgical History:  Procedure Laterality Date  . ABDOMINAL HYSTERECTOMY     . APPENDECTOMY    . BLEPHAROPLASTY Bilateral    "uppers"  . CARDIAC CATHETERIZATION  11/22/2015  . CARDIAC CATHETERIZATION N/A 11/22/2015   Procedure: Left Heart Cath and Cors/Grafts Angiography;  Surgeon: Marykay Lexavid W Harding, MD;  [facility];  Service: Cardiovascular;  Laterality: N/A;  . CARDIAC CATHETERIZATION N/A 11/22/2015   Procedure: Coronary Stent Intervention;  Surgeon: Marykay Lexavid W Harding, MD;  Location[facility] LAB;  Service: Cardiovascular;  Laterality: N/A;  . CATARACT EXTRACTION, BILATERAL Bilateral   . CESAREAN SECTION  1977  . CORONARY ANGIOPLASTY WITH STENT PLACEMENT  "several"   "last one was 11/2014:  DES to the posterolateral branch /notes 11/20/2015  . CORONARY ARTERY BYPASS GRAFT  2003   [place]; CABG X 4  . DILATION AND CURETTAGE OF UTERUS    . KIDNEY STONE SURGERY     "opened me up"  . LAPAROSCOPIC CHOLECYSTECTOMY    . TONSILLECTOMY    . TUBAL LIGATION       Current Outpatient Prescriptions  Medication Sig Dispense Refill  . amLODipine (NORVASC) 5 MG tablet TAKE 1 TABLET BY MOUTH EVERY DAY 90 tablet 3  . aspirin 81 MG tablet Take 81 mg by mouth daily.    . clopidogrel (PLAVIX) 75 MG tablet TAKE 1 TABLET BY MOUTH EVERY DAY 90 tablet 3  . Ibuprofen-Diphenhydramine Cit (IBUPROFEN PM) 200-38 MG TABS Take 1 tablet by mouth at bedtime as needed.     . isosorbide mononitrate (IMDUR)  30 MG 24 hr tablet TAKE 1 TABLET BY MOUTH DAILY 30 tablet 6  . levothyroxine (SYNTHROID, LEVOTHROID) 75 MCG tablet TAKE ONE TABLET BY MOUTH DAILY BEFORE BREAKFAST 90 tablet 0  . lisinopril (PRINIVIL,ZESTRIL) 20 MG tablet TAKE ONE TABLET BY MOUTH TWICE DAILY 180 tablet 3  . meclizine (ANTIVERT) 25 MG tablet Take 1 tablet (25 mg total) by mouth 3 (three) times daily as needed for dizziness. 30 tablet 0  . metoprolol succinate (TOPROL-XL) 25 MG 24 hr tablet TAKE 1/2 TABLET BY MOUTH IN THE EVENING 15 tablet 4  . nitroGLYCERIN (NITROSTAT) 0.4 MG SL tablet Place 1 tablet (0.4  mg total) under the tongue every 5 (five) minutes as needed for chest pain. If no relief after the 3 rd dose, proceed to the ED for an evaluation 25 tablet 3  . ondansetron (ZOFRAN ODT) 4 MG disintegrating tablet Take 1 tablet (4 mg total) by mouth every 6 (six) hours as needed for nausea or vomiting. 20 tablet 0  . pantoprazole (PROTONIX) 40 MG tablet TAKE 1 TABLET BY MOUTH EVERY DAY 90 tablet 3  . PARoxetine (PAXIL) 30 MG tablet Take 30 mg by mouth daily.    . rosuvastatin (CRESTOR) 20 MG tablet Take 1 tablet (20 mg total) by mouth daily. For cholesterol 90 tablet 0   No current facility-administered medications for this visit.     Allergies:   Patient has no known allergies.    Social History:  The patient  reports that she has never smoked. She has never used smokeless tobacco. She reports that she drinks alcohol. She reports that she does not use drugs.   Family History:  The patient's family history includes Heart attack in her mother; Heart disease in her brother and mother.    ROS: All other systems are reviewed and negative. Unless otherwise mentioned in H&P    PHYSICAL EXAM: VS:  BP 118/78   Pulse 92   Ht  (1.575 m)   Wt 218 lb (98.9 kg)   SpO2 97%   BMI 39.87 kg/m  , BMI Body mass index is 39.87 kg/m. GEN: Well nourished, well developed, in no acute distress Morbidly obese.  HEENT: normal  Neck: no JVD, carotid bruits, or masses Cardiac: RRR; no murmurs, rubs, or gallops,no edema  Respiratory:  clear to auscultation bilaterally, normal work of breathing. GI: soft, nontender, nondistended, + BS MS: no deformity or atrophy  Skin: warm and dry, no rash Neuro:  Strength and sensation are intact Psych: euthymic mood, full affect   Recent Labs: 04/20/2017: ALT 16; BUN 16; Creatinine, Ser 1.31; Hemoglobin 13.4; Platelets 344; Potassium 3.9; Sodium 138; TSH 1.673    Lipid Panel    Component Value Date/Time   CHOL 209 (H) 04/20/2017 1100   CHOL 180 12/24/2015  0917   TRIG 343 (H) 04/20/2017 1100   HDL 35 (L) 04/20/2017 1100   HDL 37 (L) 12/24/2015 0917   CHOLHDL 6.0 04/20/2017 1100   VLDL 69 (H) 04/20/2017 1100   LDLCALC 105 (H) 04/20/2017 1100   LDLCALC 86 12/24/2015 0917      Wt Readings from Last 3 Encounters:  05/07/17 218 lb (98.9 kg)  04/28/17 217 lb (98.4 kg)  12/21/16 218 lb 6.4 oz (99.1 kg)      Other studies Reviewed: Conclusion Cath-11/22/2015  1. Prox RCA to Mid RCA lesion, 30% stenosed - stent stenosis was diffuse.. The lesion was previously treated with a stent (unknown type). 2. Mid RCA lesion, 85%  stenosed - tandem 85% lesions in the distal portion of the stent. Post angiosculpt balloon angioplasty, there is a 30% residual stenosis. The lesion was previously treated. 3. Post Atrio lesion, 90% stenosed. As previous described 4. Prox Cx to Mid Cx lesion, 70% stenosed - diffuse in-stent restenosis. Post Cutting Balloon angioplasty, there is a 20% residual stenosis. The lesion was previously treated with a stent (unknown type). 5. Mid Cx lesion, 95% stenosed. Post balloon angioplasty, there is a 20% residual stenosis. 6. Ost LAD to Prox LAD lesion, 100% stenosed. As previously described 7. LIMA is large, and is anatomically normal - with normal flow down relatively normal distal LAD. 8. SVGs were not injected . Known to be occluded 9. Ost 2nd Mrg lesion, 55% stenosed - jailed by original stent 10. The left ventricular systolic function is normal.  Apparently stable native coronary artery disease with known graft occlusion with exception of patent LIMA-LAD. Extensive stent work in the mid RCA and circumflex with in-stent restenosis treated with cutting or sculpt balloon angioplasty. The distal edge of the circumflex stent 95% lesion was treated with balloon plasty only. This is because of difficulty advancing besides a compliant balloon through the upstream stent. There was a near stent-like result.   Echocardiogram  10/17/2015 Study Conclusions  - Left ventricle: The cavity size was normal. Wall thickness was   increased in a pattern of mild LVH. Systolic function was   vigorous. The estimated ejection fraction was in the range of 65%   to 70%. Wall motion was normal; there were no regional wall   motion abnormalities. - Aortic valve: Trileaflet; mildly calcified leaflets. Moderate   calcification involving the noncoronary cusp. - Mitral valve: Calcified annulus. There was trivial regurgitation. - Right atrium: Central venous pressure (est): 3 mm Hg. - Tricuspid valve: There was trivial regurgitation. - Pulmonary arteries: PA peak pressure: 35 mm Hg (S). - Pericardium, extracardiac: There was no pericardial effusion.  ASSESSMENT AND PLAN:  1. CAD: Known history of coronary artery bypass grafting in 2003, multiple PCI's to the circumflex and RCA. Most recent cardiac catheterization in March 2017 with rotational atherectomy and balloon angioplasty of the circumflex. She remains on beta blocker and dual antiplatelet therapy with Plavix and aspirin.  The patient continues on secondary prevention with statin therapy, which has been changed to rosuvastatin 20 mg daily by primary care provider. . She offers no complaints of chest discomfort. She remains on isosorbide 30 mg daily. She has not had to take any subungual nitroglycerin.  2. NYHA Class II-III Symptoms: She states her breathing status has worsened, severely limiting her overall energy, activity, and ambulation. Review of her records does not reveal lung function studies. I'm going to order PFTs. Will also order a sleep study to evaluate for OSA secondary to obesity and daytime sleepiness.  3. Morbid obesity: I discussed with her weight loss and calorie reduction. I have printed out a low cholesterol calorie restricted diet. Concerning increasing her activity, we will check her lung function status prior to beginning an exercise program. She may  benefit from pulmonary rehabilitation with her chronic dyspnea.  4. Depression about health status: She is very tearful when discussing all of her health issues and limitations. She is been placed on antidepressant medication by primary care. May need referral for psychiatric counseling. We'll defer to PCP for clinically warranted need to do so.  Current medicines are reviewed at length with the patient today.    Labs/ tests ordered today include:  PFTs, sleep study.  Bettey Mare. Liborio Nixon, ANP, AACC   05/07/2017 3:05 PM    [facility] [address] [place], [place] [address] Phone: [phone]; Fax: [phone]

## 2017-05-07 ENCOUNTER — Encounter: Payer: Self-pay | Admitting: Adult Health

## 2017-05-07 ENCOUNTER — Ambulatory Visit (INDEPENDENT_AMBULATORY_CARE_PROVIDER_SITE_OTHER): Payer: PPO | Admitting: Adult Health

## 2017-05-07 VITALS — BP 118/78 | HR 92 | Ht 62.0 in | Wt 218.0 lb

## 2017-05-07 DIAGNOSIS — Z9861 Coronary angioplasty status: Secondary | ICD-10-CM | POA: Diagnosis not present

## 2017-05-07 DIAGNOSIS — F3289 Other specified depressive episodes: Secondary | ICD-10-CM | POA: Diagnosis not present

## 2017-05-07 DIAGNOSIS — I10 Essential (primary) hypertension: Secondary | ICD-10-CM

## 2017-05-07 DIAGNOSIS — I2510 Atherosclerotic heart disease of native coronary artery without angina pectoris: Secondary | ICD-10-CM

## 2017-05-07 DIAGNOSIS — Z23 Encounter for immunization: Secondary | ICD-10-CM | POA: Diagnosis not present

## 2017-05-07 DIAGNOSIS — R0602 Shortness of breath: Secondary | ICD-10-CM

## 2017-05-07 DIAGNOSIS — E782 Mixed hyperlipidemia: Secondary | ICD-10-CM | POA: Diagnosis not present

## 2017-05-07 NOTE — Patient Instructions (Addendum)
Medication Instructions:  Your physician recommends that you continue on your current medications as directed. Please refer to the Current Medication list given to you today.   Labwork: NONE    Testing/Procedures: Your physician has requested that you have an echocardiogram. Echocardiography is a painless test that uses sound waves to create images of your heart. It provides your doctor with information about the size and shape of your heart and how well your heart's chambers and valves are working. This procedure takes approximately one hour. There are no restrictions for this procedure.  Your physician has recommended that you have a pulmonary function test. Pulmonary Function Tests are a group of tests that measure how well air moves in and out of your lungs.  Your physician has recommended that you have a sleep study. This test records several body functions during sleep, including: brain activity, eye movement, oxygen and carbon dioxide blood levels, heart rate and rhythm, breathing rate and rhythm, the flow of air through your mouth and nose, snoring, body muscle movements, and chest and belly movement.    Follow-Up: Your physician recommends that you schedule a follow-up appointment in: 1 Month with Dr. Diona Browner   Any Other Special Instructions Will Be Listed Below (If Applicable).     If you need a refill on your cardiac medications before your next appointment, please call your pharmacy. Thank you for choosing [facility]!    Fat and Cholesterol Restricted Diet Getting too much fat and cholesterol in your diet may cause health problems. Following this diet helps keep your fat and cholesterol at normal levels. This can keep you from getting sick. What types of fat should I choose?  Choose monosaturated and polyunsaturated fats. These are found in foods such as olive oil, canola oil, flaxseeds, walnuts, almonds, and seeds.  Eat more omega-3 fats. Good choices  include salmon, mackerel, sardines, tuna, flaxseed oil, and ground flaxseeds.  Limit saturated fats. These are in animal products such as meats, butter, and cream. They can also be in plant products such as palm oil, palm kernel oil, and coconut oil.  Avoid foods with partially hydrogenated oils in them. These contain trans fats. Examples of foods that have trans fats are stick margarine, some tub margarines, cookies, crackers, and other baked goods. What general guidelines do I need to follow?  Check food labels. Look for the words "trans fat" and "saturated fat."  When preparing a meal: ? Fill half of your plate with vegetables and green salads. ? Fill one fourth of your plate with whole grains. Look for the word "whole" as the first word in the ingredient list. ? Fill one fourth of your plate with lean protein foods.  Eat more foods that have fiber, like apples, carrots, beans, peas, and barley.  Eat more home-cooked foods. Eat less at restaurants and buffets.  Limit or avoid alcohol.  Limit foods high in starch and sugar.  Limit fried foods.  Cook foods without frying them. Baking, boiling, grilling, and broiling are all great options.  Lose weight if you are overweight. Losing even a small amount of weight can help your overall health. It can also help prevent diseases such as diabetes and heart disease. What foods can I eat? Grains Whole grains, such as whole wheat or whole grain breads, crackers, cereals, and pasta. Unsweetened oatmeal, bulgur, barley, quinoa, or brown rice. Corn or whole wheat flour tortillas. Vegetables Fresh or frozen vegetables (raw, steamed, roasted, or grilled). Green salads. Fruits All  fresh, canned (in natural juice), or frozen fruits. Meat and Other Protein Products Ground beef (85% or leaner), grass-fed beef, or beef trimmed of fat. Skinless chicken or [place]. Ground chicken or [place]. Pork trimmed of fat. All fish and seafood. Eggs. Dried beans,  peas, or lentils. Unsalted nuts or seeds. Unsalted canned or dry beans. Dairy Low-fat dairy products, such as skim or 1% milk, 2% or reduced-fat cheeses, low-fat ricotta or cottage cheese, or plain low-fat yogurt. Fats and Oils Tub margarines without trans fats. Light or reduced-fat mayonnaise and salad dressings. Avocado. Olive, canola, sesame, or safflower oils. Natural peanut or almond butter (choose ones without added sugar and oil). The items listed above may not be a complete list of recommended foods or beverages. Contact your dietitian for more options. What foods are not recommended? Grains White bread. White pasta. White rice. Cornbread. Bagels, pastries, and croissants. Crackers that contain trans fat. Vegetables White potatoes. Corn. Creamed or fried vegetables. Vegetables in a cheese sauce. Fruits Dried fruits. Canned fruit in light or heavy syrup. Fruit juice. Meat and Other Protein Products Fatty cuts of meat. Ribs, chicken wings, bacon, sausage, bologna, salami, chitterlings, fatback, hot dogs, bratwurst, and packaged luncheon meats. Liver and organ meats. Dairy Whole or 2% milk, cream, half-and-half, and cream cheese. Whole milk cheeses. Whole-fat or sweetened yogurt. Full-fat cheeses. Nondairy creamers and whipped toppings. Processed cheese, cheese spreads, or cheese curds. Sweets and Desserts Corn syrup, sugars, honey, and molasses. Candy. Jam and jelly. Syrup. Sweetened cereals. Cookies, pies, cakes, donuts, muffins, and ice cream. Fats and Oils Butter, stick margarine, lard, shortening, ghee, or bacon fat. Coconut, palm kernel, or palm oils. Beverages Alcohol. Sweetened drinks (such as sodas, lemonade, and fruit drinks or punches). The items listed above may not be a complete list of foods and beverages to avoid. Contact your dietitian for more information. This information is not intended to replace advice given to you by your health care provider. Make sure you discuss  any questions you have with your health care provider. Document Released: 02/09/2012 Document Revised: 04/16/2016 Document Reviewed: 11/09/2013 Elsevier Interactive Patient Education  [employer].

## 2017-05-10 ENCOUNTER — Telehealth: Payer: Self-pay | Admitting: Family Medicine

## 2017-05-10 ENCOUNTER — Other Ambulatory Visit: Payer: Self-pay

## 2017-05-10 MED ORDER — PAROXETINE HCL 30 MG PO TABS: ORAL_TABLET | ORAL | 2 refills | Status: DC

## 2017-05-10 MED ORDER — ROSUVASTATIN CALCIUM 20 MG PO TABS: 20.0000 mg | ORAL_TABLET | Freq: Every day | ORAL | 0 refills | Status: DC

## 2017-05-10 NOTE — Telephone Encounter (Signed)
Received phone call from pharmacy requesting a refill on patient's Paxil. Patient states that we refill her Paxil. According to epic we have not filled Paxil it came from an outside pharmacy.

## 2017-05-10 NOTE — Telephone Encounter (Signed)
See carolyns note this month, she increased pts paxil to 30 mg daily (one and a half 20s), plz adjust accordingly and rx six mo worth

## 2017-05-19 ENCOUNTER — Ambulatory Visit (HOSPITAL_COMMUNITY)
Admission: RE | Admit: 2017-05-19 | Discharge: 2017-05-19 | Disposition: A | Discharge status: Home / Self Care | Payer: PPO | Source: Ambulatory Visit | Attending: Adult Health | Admitting: Adult Health

## 2017-05-19 DIAGNOSIS — R0602 Shortness of breath: Secondary | ICD-10-CM | POA: Insufficient documentation

## 2017-05-19 DIAGNOSIS — I2510 Atherosclerotic heart disease of native coronary artery without angina pectoris: Secondary | ICD-10-CM | POA: Diagnosis not present

## 2017-05-19 DIAGNOSIS — E785 Hyperlipidemia, unspecified: Secondary | ICD-10-CM | POA: Insufficient documentation

## 2017-05-19 DIAGNOSIS — Z9861 Coronary angioplasty status: Secondary | ICD-10-CM | POA: Insufficient documentation

## 2017-05-19 DIAGNOSIS — I10 Essential (primary) hypertension: Secondary | ICD-10-CM | POA: Insufficient documentation

## 2017-05-19 LAB — ECHOCARDIOGRAM COMPLETE
CHL CUP RV SYS PRESS: 26 mmHg
CHL CUP TV REG PEAK VELOCITY: 238 cm/s
E decel time: 285 msec
EERAT: 11.89
FS: 29 % (ref 28–44)
IVS/LV PW RATIO, ED: 1.13
LA diam index: 1.36 cm/m2
LA vol A4C: 43.7 ml
LA vol index: 26.9 mL/m2
LASIZE: 29 mm
LAVOL: 57.4 mL
LEFT ATRIUM END SYS DIAM: 29 mm
LV TDI E'LATERAL: 5.33
LV dias vol: 76 mL (ref 46–106)
LVDIAVOLIN: 35 mL/m2
LVEEAVG: 11.89
LVEEMED: 11.89
LVELAT: 5.33 cm/s
LVOT VTI: 26 cm
LVOT area: 3.14 cm2
LVOT diameter: 20 mm
LVOT peak grad rest: 5 mmHg
LVOTPV: 108 cm/s
LVOTSV: 82 mL
LVSYSVOL: 31 mL
LVSYSVOLIN: 15 mL/m2
Lateral S' vel: 10 cm/s
MV Dec: 285
MV pk A vel: 92.2 m/s
MV pk E vel: 63.4 m/s
PW: 11.9 mm — AB (ref 0.6–1.1)
Simpson's disk: 58
Stroke v: 44 ml
TAPSE: 13.5 mm
TDI e' medial: 4.03
TRMAXVEL: 238 cm/s

## 2017-05-19 MED ORDER — ALBUTEROL SULFATE (2.5 MG/3ML) 0.083% IN NEBU
2.5000 mg | INHALATION_SOLUTION | Freq: Once | RESPIRATORY_TRACT | Status: AC
  Administered 2017-05-19: 2.5 mg via RESPIRATORY_TRACT

## 2017-05-19 NOTE — Progress Notes (Signed)
*  PRELIMINARY RESULTS* Echocardiogram 2D Echocardiogram has been performed.  Stacey Drain 05/19/2017, 1:58 PM

## 2017-05-20 ENCOUNTER — Telehealth: Payer: Self-pay | Admitting: *Deleted

## 2017-05-20 NOTE — Telephone Encounter (Signed)
Called patient with test results. No answer. Left message to call back.  

## 2017-05-20 NOTE — Telephone Encounter (Signed)
-----   Message from Jodelle Gross, NP sent at 05/19/2017  8:37 PM EDT ----- Essentially normal echo, no changes in her regimen.

## 2017-05-20 NOTE — Telephone Encounter (Signed)
Pt is returning your call

## 2017-05-20 NOTE — Telephone Encounter (Signed)
Pt notified of test results

## 2017-05-26 LAB — PULMONARY FUNCTION TEST
DL/VA % PRED: 93 %
DL/VA: 4.26 ml/min/mmHg/L
DLCO COR % PRED: 67 %
DLCO cor: 14.49 ml/min/mmHg
DLCO unc % pred: 67 %
DLCO unc: 14.49 ml/min/mmHg
FEF 25-75 POST: 3.69 L/s
FEF 25-75 Pre: 1.39 L/sec
FEF2575-%Change-Post: 165 %
FEF2575-%PRED-POST: 225 %
FEF2575-%Pred-Pre: 84 %
FEV1-%CHANGE-POST: 20 %
FEV1-%Pred-Post: 126 %
FEV1-%Pred-Pre: 104 %
FEV1-Post: 2.51 L
FEV1-Pre: 2.09 L
FEV1FVC-%Change-Post: 26 %
FEV1FVC-%PRED-PRE: 98 %
FEV6-%Change-Post: -4 %
FEV6-%Pred-Post: 105 %
FEV6-%Pred-Pre: 110 %
FEV6-POST: 2.65 L
FEV6-PRE: 2.78 L
FEV6FVC-%Change-Post: 0 %
FEV6FVC-%PRED-POST: 104 %
FEV6FVC-%PRED-PRE: 103 %
FVC-%CHANGE-POST: -4 %
FVC-%PRED-POST: 101 %
FVC-%PRED-PRE: 106 %
FVC-POST: 2.68 L
FVC-PRE: 2.82 L
POST FEV1/FVC RATIO: 94 %
PRE FEV6/FVC RATIO: 99 %
Post FEV6/FVC ratio: 99 %
Pre FEV1/FVC ratio: 74 %
RV % PRED: 89 %
RV: 1.93 L
TLC % pred: 99 %
TLC: 4.71 L

## 2017-05-31 MED ORDER — ALBUTEROL SULFATE HFA 108 (90 BASE) MCG/ACT IN AERS: 2.0000 | INHALATION_SPRAY | Freq: Four times a day (QID) | RESPIRATORY_TRACT | 0 refills | Status: DC | PRN

## 2017-05-31 NOTE — Addendum Note (Signed)
Addended by: Kerney Elbe on: 05/31/2017 11:23 AM   Modules accepted: Orders

## 2017-06-14 ENCOUNTER — Encounter: Payer: Self-pay | Admitting: Cardiology

## 2017-06-14 ENCOUNTER — Encounter: Payer: Self-pay | Admitting: Family Medicine

## 2017-06-14 ENCOUNTER — Ambulatory Visit (INDEPENDENT_AMBULATORY_CARE_PROVIDER_SITE_OTHER): Payer: PPO | Admitting: Cardiology

## 2017-06-14 ENCOUNTER — Ambulatory Visit (INDEPENDENT_AMBULATORY_CARE_PROVIDER_SITE_OTHER): Payer: PPO | Admitting: Family Medicine

## 2017-06-14 VITALS — BP 138/86 | Ht 62.0 in | Wt 217.0 lb

## 2017-06-14 VITALS — BP 110/82 | HR 69 | Ht 62.0 in | Wt 218.0 lb

## 2017-06-14 DIAGNOSIS — I10 Essential (primary) hypertension: Secondary | ICD-10-CM | POA: Diagnosis not present

## 2017-06-14 DIAGNOSIS — E782 Mixed hyperlipidemia: Secondary | ICD-10-CM

## 2017-06-14 DIAGNOSIS — R0602 Shortness of breath: Secondary | ICD-10-CM

## 2017-06-14 DIAGNOSIS — J4540 Moderate persistent asthma, uncomplicated: Secondary | ICD-10-CM | POA: Diagnosis not present

## 2017-06-14 DIAGNOSIS — Z23 Encounter for immunization: Secondary | ICD-10-CM

## 2017-06-14 DIAGNOSIS — I25119 Atherosclerotic heart disease of native coronary artery with unspecified angina pectoris: Secondary | ICD-10-CM | POA: Diagnosis not present

## 2017-06-14 DIAGNOSIS — J45909 Unspecified asthma, uncomplicated: Secondary | ICD-10-CM | POA: Insufficient documentation

## 2017-06-14 MED ORDER — FLUTICASONE PROPIONATE HFA 44 MCG/ACT IN AERO: INHALATION_SPRAY | RESPIRATORY_TRACT | 11 refills | Status: DC

## 2017-06-14 MED ORDER — PAROXETINE HCL 30 MG PO TABS: 30.0000 mg | ORAL_TABLET | Freq: Every day | ORAL | 2 refills | Status: DC

## 2017-06-14 NOTE — Progress Notes (Signed)
Cardiology Office Note  Date: 06/14/2017   ID: [place], Leap 11-16-1945, MRN [id number]  PCP: Merlyn Albert, MD  Primary Cardiologist: Nona Dell, MD   Chief Complaint  Patient presents with  . Coronary Artery Disease    History of Present Illness: ADELL KOVAL is a 71 y.o. female last seen by Ms. Liborio Nixon in September. She was reporting worsening shortness of breath at that time, referred for PFTs and also an echocardiogram which showed overall stable findings as outlined below. She is now on MDIs started by Dr. Gerda Diss, She states that she has used albuterol a few times and does feel an "opening" in her chest, less than a feeling of heaviness with her shortness of breath.  I reviewed her cardiac medications are stable and outlined below. She reports compliance. States that she has good days and bad days. Tries to stay as active as she can and paces herself.  I personally reviewed her ECG today which shows sinus rhythm with R' in lead V1, increased voltage, and repolarization abnormalities.  Past Medical History:  Diagnosis Date  . Anginal pain (HCC)   . Aortic sclerosis   . CAD (coronary artery disease)    Multivessel status post CABG 2003 as well as multiple percutaneous interventions Sharma Covert [place]), DES to PLB 11/2014 ([facility])  . Depression   . Essential hypertension   . Hyperlipidemia   . Hypothyroidism   . Kidney stones   . TIA (transient ischemic attack) ~ 2013    Past Surgical History:  Procedure Laterality Date  . ABDOMINAL HYSTERECTOMY    . APPENDECTOMY    . BLEPHAROPLASTY Bilateral    "uppers"  . CARDIAC CATHETERIZATION  11/22/2015  . CARDIAC CATHETERIZATION N/A 11/22/2015   Procedure: Left Heart Cath and Cors/Grafts Angiography;  Surgeon: Marykay Lex, MD;  Location: [facility] INVASIVE CV LAB;  Service: Cardiovascular;  Laterality: N/A;  . CARDIAC CATHETERIZATION N/A 11/22/2015   Procedure: Coronary Stent Intervention;  Surgeon: Marykay Lex, MD;  Location: [facility] INVASIVE CV LAB;  Service: Cardiovascular;  Laterality: N/A;  . CATARACT EXTRACTION, BILATERAL Bilateral   . CESAREAN SECTION  1977  . CORONARY ANGIOPLASTY WITH STENT PLACEMENT  "several"   "last one was 11/2014:  DES to the posterolateral branch /notes 11/20/2015  . CORONARY ARTERY BYPASS GRAFT  2003   [place]; CABG X 4  . DILATION AND CURETTAGE OF UTERUS    . KIDNEY STONE SURGERY     "opened me up"  . LAPAROSCOPIC CHOLECYSTECTOMY    . TONSILLECTOMY    . TUBAL LIGATION      Current Outpatient Prescriptions  Medication Sig Dispense Refill  . albuterol (PROVENTIL HFA;VENTOLIN HFA) 108 (90 Base) MCG/ACT inhaler Inhale 2 puffs into the lungs every 6 (six) hours as needed for wheezing or shortness of breath. 1 Inhaler 0  . amLODipine (NORVASC) 5 MG tablet TAKE 1 TABLET BY MOUTH EVERY DAY 90 tablet 3  . aspirin 81 MG tablet Take 81 mg by mouth daily.    . clopidogrel (PLAVIX) 75 MG tablet TAKE 1 TABLET BY MOUTH EVERY DAY 90 tablet 3  . fluticasone (FLOVENT HFA) 44 MCG/ACT inhaler 2 puffs bid 1 Inhaler 11  . Ibuprofen-Diphenhydramine Cit (IBUPROFEN PM) 200-38 MG TABS Take 1 tablet by mouth at bedtime as needed.     . isosorbide mononitrate (IMDUR) 30 MG 24 hr tablet TAKE 1 TABLET BY MOUTH DAILY 30 tablet 6  . levothyroxine (SYNTHROID, LEVOTHROID) 75 MCG tablet TAKE  ONE TABLET BY MOUTH DAILY BEFORE BREAKFAST 90 tablet 0  . lisinopril (PRINIVIL,ZESTRIL) 20 MG tablet TAKE ONE TABLET BY MOUTH TWICE DAILY 180 tablet 3  . meclizine (ANTIVERT) 25 MG tablet Take 1 tablet (25 mg total) by mouth 3 (three) times daily as needed for dizziness. 30 tablet 0  . metoprolol succinate (TOPROL-XL) 25 MG 24 hr tablet TAKE 1/2 TABLET BY MOUTH IN THE EVENING 15 tablet 4  . nitroGLYCERIN (NITROSTAT) 0.4 MG SL tablet Place 1 tablet (0.4 mg total) under the tongue every 5 (five) minutes as needed for chest pain. If no relief after the 3 rd dose, proceed to the ED for an evaluation 25 tablet 3    . ondansetron (ZOFRAN ODT) 4 MG disintegrating tablet Take 1 tablet (4 mg total) by mouth every 6 (six) hours as needed for nausea or vomiting. 20 tablet 0  . pantoprazole (PROTONIX) 40 MG tablet TAKE 1 TABLET BY MOUTH EVERY DAY 90 tablet 3  . PARoxetine (PAXIL) 30 MG tablet Take 1 tablet (30 mg total) by mouth daily. 30 tablet 2  . rosuvastatin (CRESTOR) 20 MG tablet Take 1 tablet (20 mg total) by mouth daily. For cholesterol 90 tablet 0   No current facility-administered medications for this visit.    Allergies:  Patient has no known allergies.   Social History: The patient  reports that she has never smoked. She has never used smokeless tobacco. She reports that she drinks alcohol. She reports that she does not use drugs.   ROS:  Please see the history of present illness. Otherwise, complete review of systems is positive for intermittent fatigue and lack of energy.  All other systems are reviewed and negative.   Physical Exam: VS:  BP 110/82   Pulse 69   Ht 5\' 2"  (1.575 m)   Wt 218 lb (98.9 kg)   SpO2 98%   BMI 39.87 kg/m , BMI Body mass index is 39.87 kg/m.  Wt Readings from Last 3 Encounters:  06/14/17 218 lb (98.9 kg)  06/14/17 217 lb (98.4 kg)  05/07/17 218 lb (98.9 kg)    General: Obese woman, appears comfortable at rest. HEENT: Conjunctiva and lids normal, oropharynx clear. Neck: Supple, no elevated JVP or carotid bruits, no thyromegaly. Lungs: No wheezing, nonlabored breathing at rest. Cardiac: Regular rate and rhythm, no S3 or significant systolic murmur, no pericardial rub. Abdomen: Soft, nontender, bowel sounds present, no guarding or rebound. Extremities: No pitting edema, distal pulses 2+. Skin: Warm and dry. Musculoskeletal: No kyphosis. Neuropsychiatric: Alert and oriented x3, affect grossly appropriate.  ECG: I personally reviewed the tracing from 06/17/2016 which showed sinus rhythm with diffuse ST-T wave abnormalities, most prominent in the anterolateral  distribution. Repolarization changes versus ischemia.   Recent Labwork: 04/20/2017: ALT 16; AST 19; BUN 16; Creatinine, Ser 1.31; Hemoglobin 13.4; Platelets 344; Potassium 3.9; Sodium 138; TSH 1.673     Component Value Date/Time   CHOL 209 (H) 04/20/2017 1100   CHOL 180 12/24/2015 0917   TRIG 343 (H) 04/20/2017 1100   HDL 35 (L) 04/20/2017 1100   HDL 37 (L) 12/24/2015 0917   CHOLHDL 6.0 04/20/2017 1100   VLDL 69 (H) 04/20/2017 1100   LDLCALC 105 (H) 04/20/2017 1100   LDLCALC 86 12/24/2015 0917    Other Studies Reviewed Today:  Echocardiogram 05/19/2017: Study Conclusions  - Left ventricle: The cavity size was normal. Wall thickness was   increased in a pattern of moderate LVH. Systolic function was   normal. The  estimated ejection fraction was in the range of 55%   to 60%. Wall motion was normal; there were no regional wall   motion abnormalities. Doppler parameters are consistent with   abnormal left ventricular relaxation (grade 1 diastolic   dysfunction). There was no evidence of elevated ventricular   filling pressure by Doppler parameters. - Aortic valve: Mildly calcified annulus. Trileaflet; mildly   thickened leaflets. Valve area (VTI): 2.36 cm^2. Valve area   (Vmax): 1.98 cm^2. Valve area (Vmean): 2.13 cm^2. - Atrial septum: No defect or patent foramen ovale was identified. - Technically adequate study.  Cardiac catheterization and PCI 11/22/2015: 1. Prox RCA to Mid RCA lesion, 30% stenosed - stent stenosis was diffuse.. The lesion was previously treated with a stent (unknown type). 2. Mid RCA lesion, 85% stenosed - tandem 85% lesions in the distal portion of the stent. Post angiosculpt balloon angioplasty, there is a 30% residual stenosis. The lesion was previously treated. 3. Post Atrio lesion, 90% stenosed. As previous described 4. Prox Cx to Mid Cx lesion, 70% stenosed - diffuse in-stent restenosis. Post Cutting Balloon angioplasty, there is a 20% residual stenosis.  The lesion was previously treated with a stent (unknown type). 5. Mid Cx lesion, 95% stenosed. Post balloon angioplasty, there is a 20% residual stenosis. 6. Ost LAD to Prox LAD lesion, 100% stenosed. As previously described 7. LIMA is large, and is anatomically normal - with normal flow down relatively normal distal LAD. 8. SVGs were not injected . Known to be occluded 9. Ost 2nd Mrg lesion, 55% stenosed - jailed by original stent 10. The left ventricular systolic function is normal.   Apparently stable native coronary artery disease with known graft occlusion with exception of patent LIMA-LAD. Extensive stent work in the mid RCA and circumflex with in-stent restenosis treated with cutting or sculpt balloon angioplasty. The distal edge of the circumflex stent 95% lesion was treated with balloon plasty only. This is because of difficulty advancing besides a compliant balloon through the upstream stent. There was a near stent-like result.  Assessment and Plan:  1. Multivessel CAD status post CABG with subsequent percutaneous interventions as outlined above, most recently with angioplasty due to in-stent restenosis within the RCA and circumflex in March 2017. Plan to continue medical therapy for now.  2. Patient started on MDIs by Dr. Gerda DissLuking for treatment of reactive airways disease.  3. Essential hypertension, blood pressure is well controlled today.  4. Mixed hyperlipidemia, switched from Pravachol to Crestor with LDL up to 108.  Current medicines were reviewed with the patient today.   Orders Placed This Encounter  Procedures  . EKG 12-Lead    Disposition: Follow-up in 6 months, sooner if needed.  Signed, Jonelle SidleSamuel G. Donold Marotto, MD, [facility]ilionFACC 06/14/2017 4:31 PM    [facility] [address], [place], [place] [address] Phone: [phone]; Fax: [phone]

## 2017-06-14 NOTE — Patient Instructions (Signed)

## 2017-06-14 NOTE — Progress Notes (Signed)
   Subjective:    Patient ID: Lacey BarrowsJoyce H Engelstad, female    DOB: [birth year], 71 y.o.   MRN: [id number] Patient arrives office for an extremely protracted discussion. All recent visits with cardiologist.and team reviewed Echocardiogram report. Pulmonary function importance analyzed  In presence of patient   HPIDiscuss pulmonary function test results.   Would like to get shingles rx. And pneumonia vaccine. Has already had flu vaccine.   Pt notes trouble walking  Because of long distances cause fatigue and shortness of breath  Fatigue substantial with any type of exertion   Discomfort in chest feels more like a weight at times, this has been going on for past couple years   No major smoke expthe years. Has noted ongoing shortness of breath with exertion. Occasionally has discomfort also. Followed by cardiologist due to patient's chronic heart history.  On further history admits that she has not exercised fars. At this time patient notes shortness of breath with really any type of exertion. Also notes ongoing challenges with fatigue.  Recently saw her nurse practitioner. Her antidepressant was increased  Review of Systems No headache, no major weight loss or weight gain, no chest pain no back pain abdominal pain no change in bowel habits complete ROS otherwise negative     Objective:   Physical Exam  Alert and oriented, vitals reviewed and stable, NAD ENT-TM's and ext canals WNL bilat via otoscopic exam Soft palate, tonsils and post pharynx WNL via oropharyngeal exam Neck-symmetric, no masses; thyroid nonpalpable and nontender Pulmonary-no tachypnea or accessory muscle use; Clear without wheezes via auscultation Card--no abnrml murmurs, rhythm reg and rate WNL Carotid pulses symmetric, without bruits   ulmonary function tests reviewed. This revealed substantial improvement with bronchodilator.Patient's FEV starts near 100% predicted. But then improves to 120% with treatment. In  addition FEF 25-75 improved markedly.    Assessment & Plan:  Impression 1 asthma equivalent. Discussed. Proper use of inhaler discussed. We'll hold off on pulmonary referral at this time. Patient frustrated by the many different clinician's having this C. Will add Flovent 44 mics 2 puffs twice a day. Rationale discussed with patient. Proper use discussed. I think this is just one part of her puzzle. Of course will need ongoing reassessment by the cardiologist, and patient needs to find someway to start exercising even if only gently. Follow-upas scheduled  Greater than 50% of this 40 minute face to face visit was spent in counseling and discussion and coordination of care regarding the above diagnosis/diagnosies

## 2017-06-16 ENCOUNTER — Ambulatory Visit: Payer: Self-pay | Admitting: Cardiology

## 2017-06-28 ENCOUNTER — Other Ambulatory Visit: Payer: Self-pay | Admitting: Family Medicine

## 2017-07-23 ENCOUNTER — Emergency Department (HOSPITAL_COMMUNITY)
Admission: EM | Admit: 2017-07-23 | Discharge: 2017-07-23 | Disposition: A | Discharge status: Home / Self Care | Payer: PPO | Attending: Emergency Medicine | Admitting: Emergency Medicine

## 2017-07-23 ENCOUNTER — Emergency Department (HOSPITAL_COMMUNITY): Payer: PPO

## 2017-07-23 ENCOUNTER — Telehealth: Payer: Self-pay | Admitting: *Deleted

## 2017-07-23 ENCOUNTER — Encounter (HOSPITAL_COMMUNITY): Payer: Self-pay | Admitting: Cardiology

## 2017-07-23 DIAGNOSIS — Z7901 Long term (current) use of anticoagulants: Secondary | ICD-10-CM | POA: Diagnosis not present

## 2017-07-23 DIAGNOSIS — E039 Hypothyroidism, unspecified: Secondary | ICD-10-CM | POA: Diagnosis not present

## 2017-07-23 DIAGNOSIS — I2510 Atherosclerotic heart disease of native coronary artery without angina pectoris: Secondary | ICD-10-CM | POA: Diagnosis not present

## 2017-07-23 DIAGNOSIS — R42 Dizziness and giddiness: Secondary | ICD-10-CM | POA: Diagnosis not present

## 2017-07-23 DIAGNOSIS — Z8673 Personal history of transient ischemic attack (TIA), and cerebral infarction without residual deficits: Secondary | ICD-10-CM | POA: Insufficient documentation

## 2017-07-23 DIAGNOSIS — Z79899 Other long term (current) drug therapy: Secondary | ICD-10-CM | POA: Insufficient documentation

## 2017-07-23 DIAGNOSIS — I10 Essential (primary) hypertension: Secondary | ICD-10-CM | POA: Insufficient documentation

## 2017-07-23 DIAGNOSIS — J45909 Unspecified asthma, uncomplicated: Secondary | ICD-10-CM | POA: Diagnosis not present

## 2017-07-23 LAB — COMPREHENSIVE METABOLIC PANEL
ALBUMIN: 4.3 g/dL (ref 3.5–5.0)
ALK PHOS: 75 U/L (ref 38–126)
ALT: 14 U/L (ref 14–54)
ANION GAP: 9 (ref 5–15)
AST: 22 U/L (ref 15–41)
BILIRUBIN TOTAL: 0.9 mg/dL (ref 0.3–1.2)
BUN: 17 mg/dL (ref 6–20)
CALCIUM: 9.4 mg/dL (ref 8.9–10.3)
CO2: 24 mmol/L (ref 22–32)
Chloride: 106 mmol/L (ref 101–111)
Creatinine, Ser: 1.19 mg/dL — ABNORMAL HIGH (ref 0.44–1.00)
GFR calc non Af Amer: 44 mL/min — ABNORMAL LOW (ref >60)
GFR, EST AFRICAN AMERICAN: 51 mL/min — AB (ref >60)
GLUCOSE: 131 mg/dL — AB (ref 65–99)
Potassium: 3.5 mmol/L (ref 3.5–5.1)
Sodium: 139 mmol/L (ref 135–145)
TOTAL PROTEIN: 7.1 g/dL (ref 6.5–8.1)

## 2017-07-23 LAB — CBC WITH DIFFERENTIAL/PLATELET
Basophils Absolute: 0 10*3/uL (ref 0.0–0.1)
Basophils Relative: 1 %
Eosinophils Absolute: 0.3 10*3/uL (ref 0.0–0.7)
Eosinophils Relative: 4 %
HEMATOCRIT: 37.7 % (ref 36.0–46.0)
HEMOGLOBIN: 12.2 g/dL (ref 12.0–15.0)
LYMPHS ABS: 1.9 10*3/uL (ref 0.7–4.0)
Lymphocytes Relative: 24 %
MCH: 29.3 pg (ref 26.0–34.0)
MCHC: 32.4 g/dL (ref 30.0–36.0)
MCV: 90.6 fL (ref 78.0–100.0)
MONOS PCT: 6 %
Monocytes Absolute: 0.5 10*3/uL (ref 0.1–1.0)
NEUTROS ABS: 5.2 10*3/uL (ref 1.7–7.7)
NEUTROS PCT: 65 %
Platelets: 308 10*3/uL (ref 150–400)
RBC: 4.16 MIL/uL (ref 3.87–5.11)
RDW: 13.4 % (ref 11.5–15.5)
WBC: 7.9 10*3/uL (ref 4.0–10.5)

## 2017-07-23 MED ORDER — MECLIZINE HCL 12.5 MG PO TABS
12.5000 mg | ORAL_TABLET | Freq: Once | ORAL | Status: AC
  Administered 2017-07-23: 12.5 mg via ORAL
  Filled 2017-07-23: qty 1

## 2017-07-23 NOTE — ED Triage Notes (Signed)
Pt dizzy and "off balance " since last night.  Worse this morning.  States she had a similar episode Friday.

## 2017-07-23 NOTE — Telephone Encounter (Signed)
Pt called states she has been dizzy for a few days and her thoughts have not been clear. States she does not think she is having vertigo. Pt advised to go directly to ED. [facility]

## 2017-07-23 NOTE — ED Provider Notes (Signed)
[facility] EMERGENCY DEPARTMENT Provider Note   CSN: [id number] Arrival date & time: 07/23/17  1022     History   Chief Complaint Chief Complaint  Patient presents with  . Dizziness    HPI ERON GOBLE is a 71 y.o. female.  Patient presents with intermittent balance issues and dizzy sensation for the past week. Patient's had a stroke in the past and this has some similarities to that. No significant deficits since her stroke. Patient is significant cardiac history multiple stents and is followed closely. Patient denies any new medications. No blood in the stools. Currently symptoms are mild.      Past Medical History:  Diagnosis Date  . Anginal pain (HCC)   . Aortic sclerosis   . CAD (coronary artery disease)    Multivessel status post CABG 2003 as well as multiple percutaneous interventions Sharma Covert [place]), DES to PLB 11/2014 ([facility])  . Depression   . Essential hypertension   . Hyperlipidemia   . Hypothyroidism   . Kidney stones   . TIA (transient ischemic attack) ~ 2013    Patient Active Problem List   Diagnosis Date Noted  . Asthma 06/14/2017  . Fatigue 12/11/2015  . Accelerating angina (HCC) 11/22/2015  . CAD S/P percutaneous coronary angioplasty 11/22/2015  . Depression 09/03/2015  . Hypothyroidism 09/03/2015  . Atherosclerosis of coronary artery bypass graft with angina pectoris (HCC) 09/03/2015  . Essential hypertension 09/03/2015  . Hyperlipidemia with target LDL less than 70 09/03/2015    Past Surgical History:  Procedure Laterality Date  . ABDOMINAL HYSTERECTOMY    . APPENDECTOMY    . BLEPHAROPLASTY Bilateral    "uppers"  . CARDIAC CATHETERIZATION  11/22/2015  . CARDIAC CATHETERIZATION N/A 11/22/2015   Procedure: Left Heart Cath and Cors/Grafts Angiography;  Surgeon: Marykay Lex, MD;  Location: [facility]VAS[facility] CV LAB;  Service: Cardiovascular;  Laterality: N/A;  . CARDIAC CATHETERIZATION N/A 11/22/2015   Procedure: Coronary Stent  Intervention;  Surgeon: Marykay Lex, MD;  Location: [facility] INVASIVE CV LAB;  Service: Cardiovascular;  Laterality: N/A;  . CATARACT EXTRACTION, BILATERAL Bilateral   . CESAREAN SECTION  1977  . CORONARY ANGIOPLASTY WITH STENT PLACEMENT  "several"   "last one was 11/2014:  DES to the posterolateral branch /notes 11/20/2015  . CORONARY ARTERY BYPASS GRAFT  2003   [place]; CABG X 4  . DILATION AND CURETTAGE OF UTERUS    . KIDNEY STONE SURGERY     "opened me up"  . LAPAROSCOPIC CHOLECYSTECTOMY    . TONSILLECTOMY    . TUBAL LIGATION      OB History    No data available       Home Medications    Prior to Admission medications   Medication Sig Start Date End Date Taking? Authorizing Provider  albuterol (PROVENTIL HFA;VENTOLIN HFA) 108 (90 Base) MCG/ACT inhaler Inhale 2 puffs into the lungs every 6 (six) hours as needed for wheezing or shortness of breath. 05/31/17   Jodelle Gross, NP  amLODipine (NORVASC) 5 MG tablet TAKE 1 TABLET BY MOUTH EVERY DAY 03/01/17   Jonelle Sidle, MD  aspirin 81 MG tablet Take 81 mg by mouth daily.    [provider]  clopidogrel (PLAVIX) 75 MG tablet TAKE 1 TABLET BY MOUTH EVERY DAY 03/01/17   Jonelle Sidle, MD  fluticasone [facility] HFA) 44 MCG/ACT inhaler 2 puffs bid 06/14/17   Merlyn Albert, MD  Ibuprofen-Diphenhydramine Cit (IBUPROFEN PM) 200-38 MG TABS Take 1 tablet by  mouth at bedtime as needed.     [provider]  isosorbide mononitrate (IMDUR) 30 MG 24 hr tablet TAKE 1 TABLET BY MOUTH DAILY 04/28/17   Jonelle SidleMcDowell, Samuel G, MD  levothyroxine (SYNTHROID, LEVOTHROID) 75 MCG tablet TAKE ONE TABLET BY MOUTH DAILY BEFORE BREAKFAST 04/09/17   Merlyn AlbertLuking, William S, MD  lisinopril (PRINIVIL,ZESTRIL) 20 MG tablet TAKE ONE TABLET BY MOUTH TWICE DAILY 04/28/17   Jonelle SidleMcDowell, Samuel G, MD  meclizine (ANTIVERT) 25 MG tablet Take 1 tablet (25 mg total) by mouth 3 (three) times daily as needed for dizziness. 06/17/16   Merlyn AlbertLuking, William S, MD    metoprolol succinate (TOPROL-XL) 25 MG 24 hr tablet TAKE 1/2 TABLET BY MOUTH IN THE EVENING 06/28/17   Babs SciaraLuking, Scott A, MD  nitroGLYCERIN (NITROSTAT) 0.4 MG SL tablet Place 1 tablet (0.4 mg total) under the tongue every 5 (five) minutes as needed for chest pain. If no relief after the 3 rd dose, proceed to the ED for an evaluation 09/14/16 12/21/17  Jonelle SidleMcDowell, Samuel G, MD  ondansetron (ZOFRAN ODT) 4 MG disintegrating tablet Take 1 tablet (4 mg total) by mouth every 6 (six) hours as needed for nausea or vomiting. 06/17/16   Merlyn AlbertLuking, William S, MD  pantoprazole (PROTONIX) 40 MG tablet TAKE 1 TABLET BY MOUTH EVERY DAY 03/01/17   Jonelle SidleMcDowell, Samuel G, MD  PARoxetine (PAXIL) 30 MG tablet Take 1 tablet (30 mg total) by mouth daily. 06/14/17   Merlyn AlbertLuking, William S, MD  rosuvastatin (CRESTOR) 20 MG tablet Take 1 tablet (20 mg total) by mouth daily. For cholesterol 05/10/17   Campbell RichesHoskins, Carolyn C, NP    Family History Family History  Problem Relation Age of Onset  . Heart attack Mother   . Heart disease Mother   . Heart disease Brother     Social History Social History   Tobacco Use  . Smoking status: Never Smoker  . Smokeless tobacco: Never Used  Substance Use Topics  . Alcohol use: Yes    Alcohol/week: 0.0 oz    Comment: 11/22/2015 "nothing since ~ 2005; occasionally had a drink w/dinner before 2005"  . Drug use: No     Allergies   Patient has no known allergies.   Review of Systems Review of Systems  Constitutional: Positive for appetite change. Negative for chills and fever.  HENT: Negative for congestion.   Eyes: Negative for visual disturbance.  Respiratory: Negative for shortness of breath.   Cardiovascular: Negative for chest pain.  Gastrointestinal: Negative for abdominal pain and vomiting.  Genitourinary: Negative for dysuria and flank pain.  Musculoskeletal: Negative for back pain, neck pain and neck stiffness.  Skin: Negative for rash.  Neurological: Positive for dizziness and  light-headedness. Negative for syncope and headaches.     Physical Exam Updated Vital Signs BP (!) 150/79   Pulse (!) 56   Temp 98.1 F (36.7 C)   Resp 10   Ht 5\' 2"  (1.575 m)   Wt 90.7 kg (200 lb)   SpO2 98%   BMI 36.58 kg/m   Physical Exam  Constitutional: She is oriented to person, place, and time. She appears well-developed and well-nourished.  HENT:  Head: Normocephalic and atraumatic.  Eyes: Conjunctivae are normal. Right eye exhibits no discharge. Left eye exhibits no discharge.  Neck: Normal range of motion. Neck supple. No tracheal deviation present.  Cardiovascular: Normal rate and regular rhythm.  Pulmonary/Chest: Effort normal and breath sounds normal.  Abdominal: Soft. She exhibits no distension. There is no tenderness. There  is no guarding.  Musculoskeletal: She exhibits no edema.  Neurological: She is alert and oriented to person, place, and time. She has normal strength. No cranial nerve deficit or sensory deficit. She exhibits normal muscle tone. Coordination normal. GCS eye subscore is 4. GCS verbal subscore is 5. GCS motor subscore is 6.  Visual fields intact bilateral. Finger-nose and heel-to-shin intact bilaterally  Skin: Skin is warm. No rash noted.  Psychiatric: She has a normal mood and affect.  Nursing note and vitals reviewed.    ED Treatments / Results  Labs (all labs ordered are listed, but only abnormal results are displayed) Labs Reviewed  COMPREHENSIVE METABOLIC PANEL - Abnormal; Notable for the following components:      Result Value   Glucose, Bld 131 (*)    Creatinine, Ser 1.19 (*)    GFR calc non Af Amer 44 (*)    GFR calc Af Amer 51 (*)    All other components within normal limits  CBC WITH DIFFERENTIAL/PLATELET    EKG  EKG Interpretation None       Radiology Mr Brain Wo Contrast  Result Date: 07/23/2017 CLINICAL DATA:  Vertigo persistent central.  Dizziness EXAM: MRI HEAD WITHOUT CONTRAST TECHNIQUE: Multiplanar,  multiecho pulse sequences of the brain and surrounding structures were obtained without intravenous contrast. COMPARISON:  None FINDINGS: Brain: Ventricle size normal. Cerebral volume normal for age. Negative for acute infarct. Scattered small white matter hyperintensities bilaterally. Small basal ganglia chronic infarcts. Negative for hemorrhage or mass. No edema or shift of the midline structures. Small focus of susceptibility right anterior cerebellum near the foramen of Luschka. Possible choroid calcification. Can't exclude an area of chronic hemorrhage. No surrounding edema. Vascular: Normal arterial flow void Skull and upper cervical spine: Negative Sinuses/Orbits: Mild mucosal edema paranasal sinuses. Bilateral cataract removal Other: None IMPRESSION: No acute infarct. Mild chronic microvascular ischemic change in the white matter Susceptibility adjacent to the foramina Luschka likely due to choroid calcification. Chronic blood products are possible but this to be an unusual location. Electronically Signed   By: Marlan Palauharles  Clark M.D.   On: 07/23/2017 13:07    Procedures Procedures (including critical care time)  Medications Ordered in ED Medications  meclizine (ANTIVERT) tablet 12.5 mg (12.5 mg Oral Given 07/23/17 1257)     Initial Impression / Assessment and Plan / ED Course  I have reviewed the triage vital signs and the nursing notes.  Pertinent labs & imaging results that were available during my care of the patient were reviewed by me and considered in my medical decision making (see chart for details).    Patient presents with recurrent balance/dizziness presentation. Concern for possible stroke versus peripheral vertigo versus medication side effect. Plan for MRI of the brain, screening blood work, medicines as needed. No focal neuro deficits.  MRI negative for any acute stroke. Patient stable for outpatient follow-up  Final Clinical Impressions(s) / ED Diagnoses   Final  diagnoses:  Dizziness    ED Discharge Orders    None       Blane OharaZavitz, Japhet Morgenthaler, MD 07/23/17 [phone]

## 2017-07-23 NOTE — ED Notes (Signed)
Pt taken to MRI  

## 2017-07-23 NOTE — ED Notes (Signed)
Patient ambulatory to the restroom without assistance. 

## 2017-07-23 NOTE — Telephone Encounter (Signed)
ok 

## 2017-07-23 NOTE — Discharge Instructions (Signed)
Try meclizine as needed for dizziness, stay hydrated with water.  If you were given medicines take as directed.  If you are on coumadin or contraceptives realize their levels and effectiveness is altered by many different medicines.  If you have any reaction (rash, tongues swelling, other) to the medicines stop taking and see a physician.    If your blood pressure was elevated in the ER make sure you follow up for management with a primary doctor or return for chest pain, shortness of breath or stroke symptoms.  Please follow up as directed and return to the ER or see a physician for new or worsening symptoms.  Thank you. Vitals:   07/23/17 1130 07/23/17 1200 07/23/17 1253 07/23/17 1300  BP: 132/76 (!) 149/76  (!) 150/79  Pulse: (!) 56 (!) 54  (!) 56  Resp: 11 10    Temp:   98.1 F (36.7 C)   TempSrc:      SpO2: 95% 96%  98%  Weight:      Height:

## 2017-07-23 NOTE — ED Notes (Signed)
Pt returned from MRI °

## 2017-08-06 ENCOUNTER — Ambulatory Visit: Payer: PPO | Admitting: Family Medicine

## 2017-08-06 ENCOUNTER — Encounter: Payer: Self-pay | Admitting: Family Medicine

## 2017-08-06 VITALS — Temp 97.6°F | Ht 62.0 in | Wt 213.2 lb

## 2017-08-06 DIAGNOSIS — M792 Neuralgia and neuritis, unspecified: Secondary | ICD-10-CM | POA: Diagnosis not present

## 2017-08-06 DIAGNOSIS — J4540 Moderate persistent asthma, uncomplicated: Secondary | ICD-10-CM | POA: Diagnosis not present

## 2017-08-06 MED ORDER — HYDROCODONE-ACETAMINOPHEN 5-325 MG PO TABS: 0.5000 | ORAL_TABLET | Freq: Four times a day (QID) | ORAL | 0 refills | Status: DC | PRN

## 2017-08-06 NOTE — Progress Notes (Signed)
   Subjective:    Patient ID: Lacey BarrowsJoyce H Bailey, female    DOB: 23-Jul-1946, 71 y.o.   MRN: [id number]  HPI Patient arrives with c/o right ear pain for a few days.  Last week occas dry cough no runny nose no couse no cong  Mild with swalloing  Now ice pick sensation   Nnow severe  No hx in past patient reports  The steroid inhaler has "changed my life" has helped tremendously with the cough wheezing exertional dyspnea  Patient arrives this evening with headache that is quite severe.  A dense focus around her right ear.  Punctuated by moments of very sharp stabbing pain.  Pain is very severe.  Concerning to the patient.  She wonders if it could be related to her heart  No chest pain no dyspnea no diaphoresis no exertional component known significant coronary artery disease    Review of Systems No headache, no major weight loss or weight gain, no chest pain no back pain abdominal pain no change in bowel habits complete ROS otherwise negative     Objective:   Physical Exam Alert and oriented, vitals reviewed and stable, NAD ENT-TM's and ext canals WNL bilat via otoscopic exam Soft palate, tonsils and post pharynx WNL via oropharyngeal exam Neck-symmetric, no masses; thyroid nonpalpable and nontender Pulmonary-no tachypnea or accessory muscle use; Clear without wheezes via auscultation Card--no abnrml murmurs, rhythm reg and rate WNL Carotid pulses symmetric, without bruits        Assessment & Plan:  Neuropathic headaches.  Trigeminal deviation.  Very long discussion held.  Recommend anti-inflammatory medicine.  Also add as needed pain medicine.  Hopefully this will be self-limited.  General nature discussed.  Small chance to turn chronic upon we could tablet with other approaches.  Nature of neuropathic headaches discussed  2.  Asthma clinically much improved discussed to maintain the same therapy through the winter at least  Greater than 50% of this 25 minute face to face  visit was spent in counseling and discussion and coordination of care regarding the above diagnosis/diagnosies

## 2017-08-06 NOTE — Patient Instructions (Signed)
This is neuropathic pain and in the facial area like this known as neuropathic heaaches  Common in the distribution of the trigeminal nerve  Use  Couple ibuprofen twice per day  Then add pain med med as needed  Say a

## 2017-08-11 ENCOUNTER — Ambulatory Visit: Payer: PPO | Admitting: Nurse Practitioner

## 2017-08-12 ENCOUNTER — Other Ambulatory Visit: Payer: Self-pay | Admitting: Cardiology

## 2017-08-12 ENCOUNTER — Encounter: Payer: Self-pay | Admitting: Family Medicine

## 2017-08-12 ENCOUNTER — Ambulatory Visit: Payer: PPO | Admitting: Family Medicine

## 2017-08-12 VITALS — BP 112/82 | Temp 97.6°F | Ht 62.0 in | Wt 217.0 lb

## 2017-08-12 DIAGNOSIS — M792 Neuralgia and neuritis, unspecified: Secondary | ICD-10-CM

## 2017-08-12 MED ORDER — HYDROCODONE-ACETAMINOPHEN 5-325 MG PO TABS: ORAL_TABLET | ORAL | 0 refills | Status: DC

## 2017-08-12 MED ORDER — GABAPENTIN 100 MG PO CAPS: ORAL_CAPSULE | ORAL | 0 refills | Status: DC

## 2017-08-12 NOTE — Progress Notes (Signed)
   Subjective:    Patient ID: Lacey Bailey, female    DOB: [birth year], 71 y.o.   MRN: [id number] Patient arrives office with a protracted and somewhat tearful discussion regarding her ongoing fairly severe pain. HPI Patient is here today returning for ear pain that has been waxing and waning since last week. Has been taking 1 12:45 half hydrocodone e and aleve and it has not helped.  Eating or swallowing or trying to eat ot drink will caus severe pain  Taking two aleave for the discomfort  Patient has developed no rash.  No loss of consciousness no chest pain no nausea no diaphoresis.  Planning substantial Christmas activity shortly and frustrated about severe level of pain  Is Review of Systems No headache, no major weight loss or weight gain, no chest pain no back pain abdominal pain no change in bowel habits complete ROS otherwise negative     Objective:   Physical Exam Alert and oriented, vitals reviewed and stable, NAD ENT-TM's and ext canals WNL bilat via otoscopic exam Soft palate, tonsils and post pharynx WNL via oropharyngeal exam Neck-symmetric, no masses; thyroid nonpalpable and nontender Pulmonary-no tachypnea or accessory muscle use; Clear without wheezes via auscultation Card--no abnrml murmurs, rhythm reg and rate WNL Carotid pulses symmetric, without bruits Patient in obvious discomfort with moments of shooting pain causing flinching   .  Very long discussion held about the nature of neuropathic pain.  Impression 1 neuropathic pain trigeminal distribution.  10 days duration now or so to continue hydrocodone.  Add gabapentin 100 mg twice daily initially then 3 times daily.  Referral to headache specialist       Assessment & Plan:

## 2017-08-12 NOTE — Patient Instructions (Signed)
Trigeminal Neuralgia Trigeminal neuralgia is a nerve disorder that causes attacks of severe facial pain. The attacks last from a few seconds to several minutes. They can happen for days, weeks, or months and then go away for months or years. Trigeminal neuralgia is also called tic douloureux. What are the causes? This condition is caused by damage to a nerve in the face that is called the trigeminal nerve. An attack can be triggered by:  Talking.  Chewing.  Putting on makeup.  Washing your face.  Shaving your face.  Brushing your teeth.  Touching your face.  What increases the risk? This condition is more likely to develop in:  Women.  People who are 50 years of age or older.  What are the signs or symptoms? The main symptom of this condition is pain in the jaw, lips, eyes, nose, scalp, forehead, and face. The pain may be intense, stabbing, electric, or shock-like. How is this diagnosed? This condition is diagnosed with a physical exam. A CT scan or MRI may be done to rule out other conditions that can cause facial pain. How is this treated? This condition may be treated with:  Avoiding the things that trigger your attacks.  Pain medicine.  Surgery. This may be done in severe cases if other medical treatment does not provide relief.  Follow these instructions at home:  Take over-the-counter and prescription medicines only as told by your health care provider.  If you wish to get pregnant, talk with your health care provider before you start trying to get pregnant.  Avoid the things that trigger your attacks. It may help to: ? Chew on the unaffected side of your mouth. ? Avoid touching your face. ? Avoid blasts of hot or cold air. Contact a health care provider if:  Your pain medicine is not helping.  You develop new, unexplained symptoms, such as: ? Double vision. ? Facial weakness. ? Changes in hearing or balance.  You become pregnant. Get help right away  if:  Your pain is unbearable, and your pain medicine does not help. This information is not intended to replace advice given to you by your health care provider. Make sure you discuss any questions you have with your health care provider. Document Released: 08/07/2000 Document Revised: 04/12/2016 Document Reviewed: 12/03/2014 Elsevier Interactive Patient Education  2018 [employer]  

## 2017-08-16 ENCOUNTER — Encounter: Payer: Self-pay | Admitting: Family Medicine

## 2017-09-10 ENCOUNTER — Other Ambulatory Visit: Payer: Self-pay | Admitting: Family Medicine

## 2017-09-30 ENCOUNTER — Other Ambulatory Visit: Payer: Self-pay | Admitting: Family Medicine

## 2017-10-04 ENCOUNTER — Other Ambulatory Visit: Payer: Self-pay | Admitting: Nurse Practitioner

## 2017-10-27 ENCOUNTER — Ambulatory Visit (INDEPENDENT_AMBULATORY_CARE_PROVIDER_SITE_OTHER): Payer: PPO | Admitting: Nurse Practitioner

## 2017-10-27 ENCOUNTER — Encounter: Payer: Self-pay | Admitting: Nurse Practitioner

## 2017-10-27 VITALS — BP 118/74 | Ht 62.0 in | Wt 220.0 lb

## 2017-10-27 DIAGNOSIS — R5383 Other fatigue: Secondary | ICD-10-CM | POA: Diagnosis not present

## 2017-10-27 DIAGNOSIS — J4540 Moderate persistent asthma, uncomplicated: Secondary | ICD-10-CM | POA: Diagnosis not present

## 2017-10-27 DIAGNOSIS — E785 Hyperlipidemia, unspecified: Secondary | ICD-10-CM | POA: Diagnosis not present

## 2017-10-27 DIAGNOSIS — Z79899 Other long term (current) drug therapy: Secondary | ICD-10-CM

## 2017-10-27 DIAGNOSIS — F3289 Other specified depressive episodes: Secondary | ICD-10-CM

## 2017-10-27 DIAGNOSIS — I10 Essential (primary) hypertension: Secondary | ICD-10-CM | POA: Diagnosis not present

## 2017-10-27 MED ORDER — GABAPENTIN 100 MG PO CAPS: ORAL_CAPSULE | ORAL | 5 refills | Status: DC

## 2017-10-27 NOTE — Progress Notes (Signed)
Subjective:  Presents for routine follow up. Has seen cardiologist. Next appointment is in May. Diagnosed with asthma. Using Flovent daily; rarely uses Albuterol. Breathing much improved. Continues to have chronic fatigue but doing short activities with rest in between which helps. Adherent to medication regimen. Needs refill on Gabapentin. Taking 2-3 times per day as needed for ear pain. Planning trip to [place] to visit her family.  Objective:   BP 118/74   Ht 5\' 2"  (1.575 m)   Wt 220 lb (99.8 kg)   BMI 40.24 kg/m  NAD. Alert,oriented. Cheerful affect. Lungs clear. Heart RRR. LE: no edema.   Assessment:   Problem List Items Addressed This Visit      Cardiovascular and Mediastinum   Essential hypertension (Chronic)     Respiratory   Asthma - Primary     Other   Depression   Fatigue   Hyperlipidemia with target LDL less than 70 (Chronic)   Relevant Orders   Lipid panel    Other Visit Diagnoses    High risk medication use       Relevant Orders   Hepatic function panel       Plan:   Meds ordered this encounter  Medications  . gabapentin (NEURONTIN) 100 MG capsule    Sig: Take one po TID    Dispense:  90 capsule    Refill:  5    Order Specific Question:   Supervising Provider    Answer:   Riccardo DubinLUKING, WILLIAM S [[id number]]   Did not get labs from September so reordered today. Continue current medications. Recommend activity such as walking as tolerated.  Return in about 6 months (around 04/29/2018) for recheck. Call back sooner if any problems.

## 2017-10-27 NOTE — Patient Instructions (Signed)
Flovent use daily Use Albuterol as needed

## 2017-11-03 DIAGNOSIS — Z79899 Other long term (current) drug therapy: Secondary | ICD-10-CM | POA: Diagnosis not present

## 2017-11-03 DIAGNOSIS — E785 Hyperlipidemia, unspecified: Secondary | ICD-10-CM | POA: Diagnosis not present

## 2017-11-04 LAB — LIPID PANEL
CHOLESTEROL TOTAL: 141 mg/dL (ref 100–199)
Chol/HDL Ratio: 3.6 ratio (ref 0.0–4.4)
HDL: 39 mg/dL — ABNORMAL LOW (ref >39)
LDL CALC: 61 mg/dL (ref 0–99)
Triglycerides: 205 mg/dL — ABNORMAL HIGH (ref 0–149)
VLDL Cholesterol Cal: 41 mg/dL — ABNORMAL HIGH (ref 5–40)

## 2017-11-04 LAB — HEPATIC FUNCTION PANEL
ALT: 13 IU/L (ref 0–32)
AST: 17 IU/L (ref 0–40)
Albumin: 4.6 g/dL (ref 3.5–4.8)
Alkaline Phosphatase: 81 IU/L (ref 39–117)
BILIRUBIN TOTAL: 0.7 mg/dL (ref 0.0–1.2)
BILIRUBIN, DIRECT: 0.18 mg/dL (ref 0.00–0.40)
Total Protein: 7 g/dL (ref 6.0–8.5)

## 2017-11-29 ENCOUNTER — Other Ambulatory Visit: Payer: Self-pay | Admitting: Cardiology

## 2017-11-29 ENCOUNTER — Other Ambulatory Visit: Payer: Self-pay | Admitting: Family Medicine

## 2017-12-21 IMAGING — CR DG CHEST 2V
2 series · 2 of 2 positions shown · non-contrast
Comparison: None in PACs

CLINICAL DATA: Preoperative examination prior to heart
catheterization ; history of quadruple bypass.

EXAM:
CHEST  2 VIEW

[view not recorded (1 of 2)]
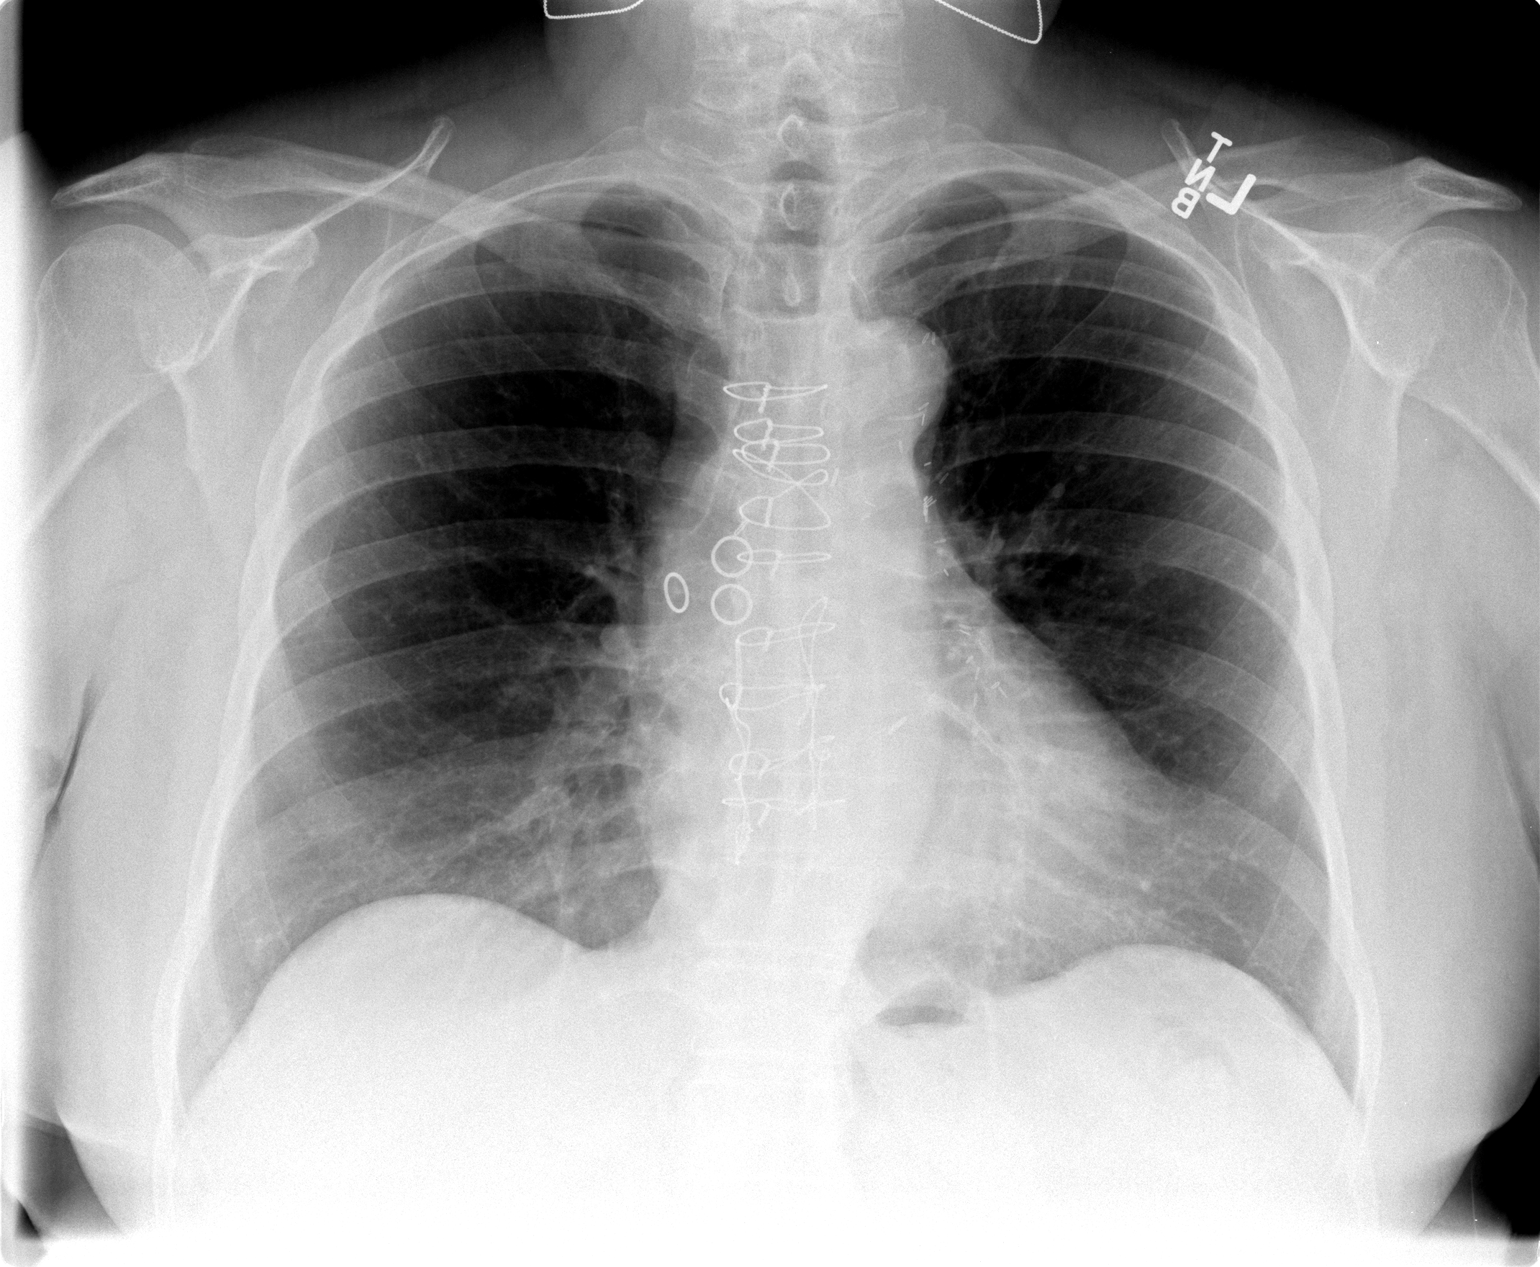

[view not recorded (2 of 2)]
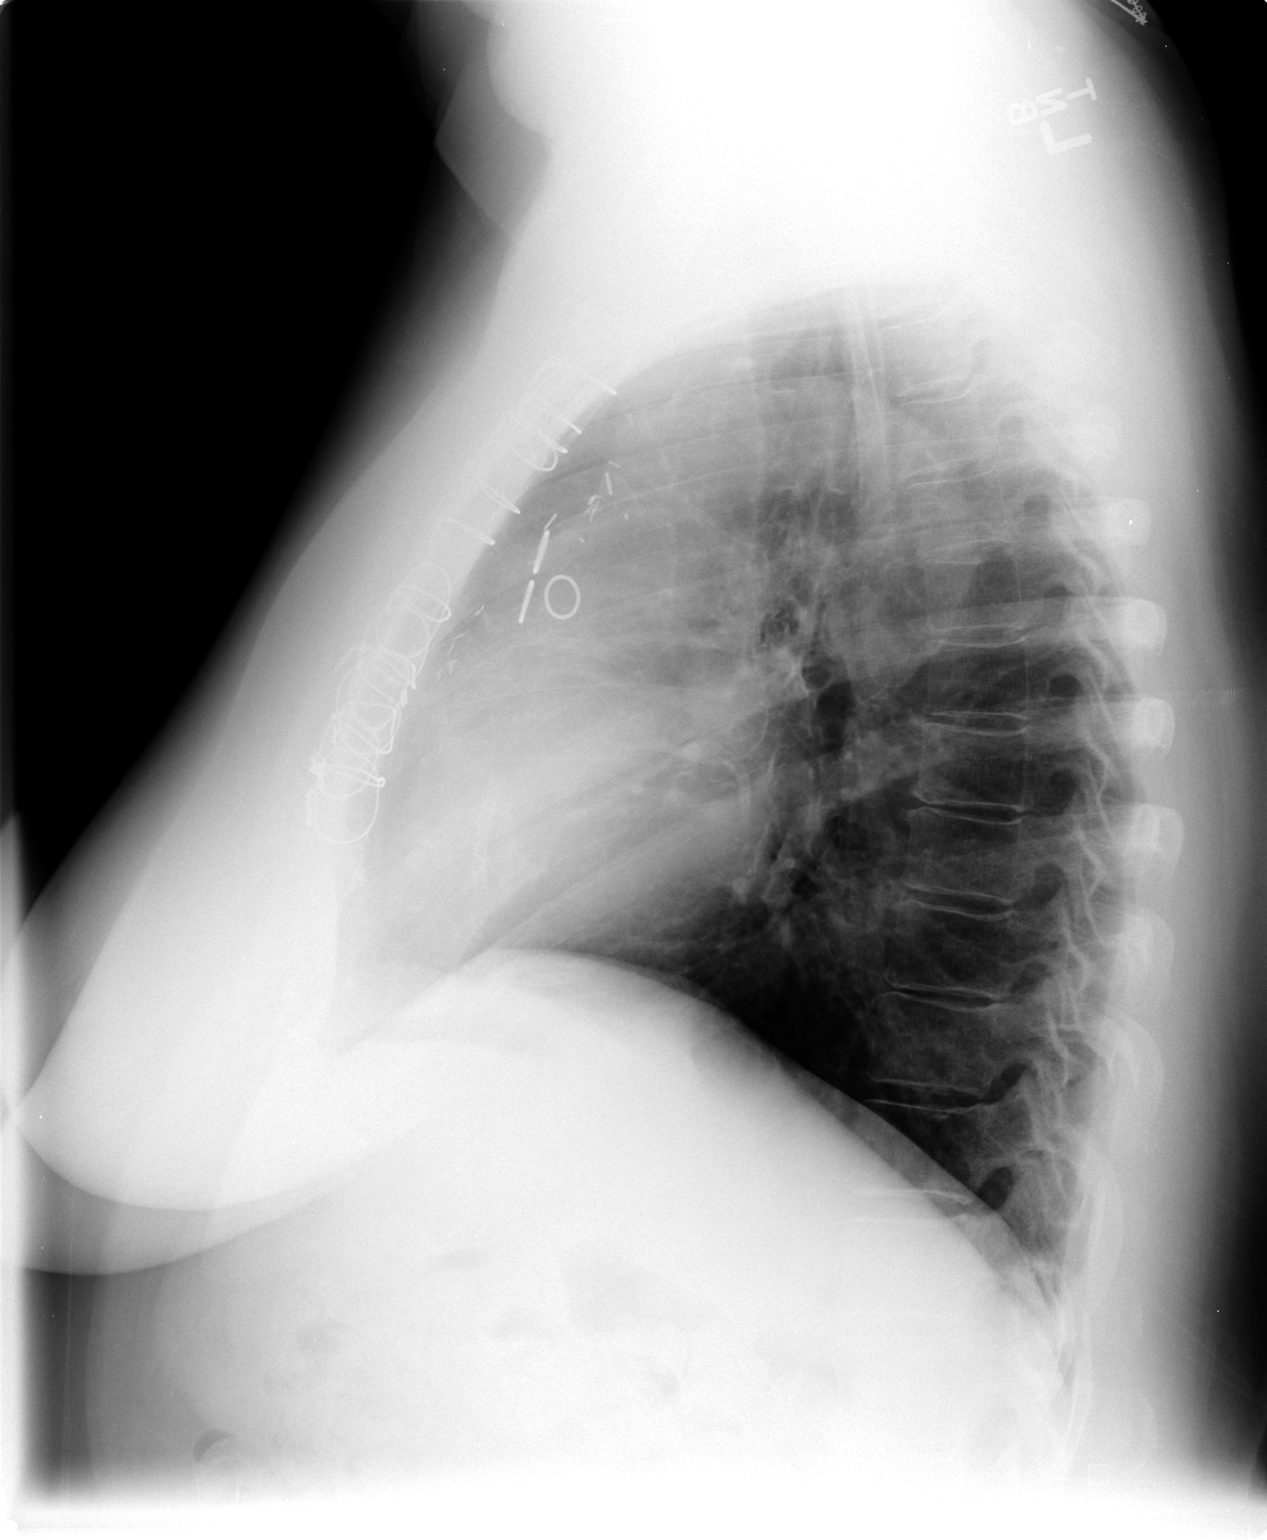

[2 of 2 positions shown; findings below may reference images not displayed]

FINDINGS: The lungs are adequately inflated and clear. The heart is normal in
size. There are post CABG changes. The sternal wires appear intact.
The retrosternal soft tissues are normal. The pulmonary vascularity
is not engorged. There is no pleural effusion. There is tortuosity
of the descending thoracic aorta. The bony thorax exhibits no acute
abnormality.
IMPRESSION: Post CABG changes.  There is no active cardiopulmonary disease.

## 2017-12-22 NOTE — Progress Notes (Signed)
Cardiology Office Note  Date: 12/24/2017   ID: Lacey Bailey, Lacey Bailey [birth year], MRN [id number]  PCP: Merlyn Albert, MD  Primary Cardiologist: Nona Dell, MD   Chief Complaint  Patient presents with  . Coronary Artery Disease  . Chest Pain    History of Present Illness: Lacey Bailey is a 72 y.o. female last seen in October 2018.  She is here today with her husband for a follow-up visit.  She states that over the last 6 months she has had steadily declining stamina, also recurring angina symptoms particularly within the last several weeks with increased use of nitroglycerin.  This is despite being consistent with her regular medical regimen.  Patient is emotional in discussing her symptoms, tearful about the impact on her quality of life.  Current cardiac regimen includes aspirin, Plavix, Norvasc, Imdur, lisinopril, Toprol-XL, and Crestor.  I reviewed interval lab work from March.  Recent LDL 61.  Last cardiac catheterization was in March 2017 as detailed below.  At that time she underwent angiosculpt angioplasty within the mid RCA for treatment of in-stent restenosis as well as cutting balloon angioplasty in the proximal to mid circumflex for treatment of in-stent restenosis as well as angioplasty of the mid circumflex.  LIMA to LAD was patent and all vein grafts were known to be occluded.  Past Medical History:  Diagnosis Date  . Anginal pain (HCC)   . Aortic sclerosis   . CAD (coronary artery disease)    Multivessel status post CABG 2003 as well as multiple percutaneous interventions [facility]), DES to PLB 11/2014 ([facility])  . Depression   . Essential hypertension   . Hyperlipidemia   . Hypothyroidism   . Kidney stones   . TIA (transient ischemic attack) ~ 2013    Past Surgical History:  Procedure Laterality Date  . ABDOMINAL HYSTERECTOMY    . APPENDECTOMY    . BLEPHAROPLASTY Bilateral    "uppers"  . CARDIAC CATHETERIZATION  11/22/2015  . CARDIAC  CATHETERIZATION N/A 11/22/2015   Procedure: Left Heart Cath and Cors/Grafts Angiography;  Surgeon: Marykay Lex, MD;  Location: [facility] INVASIVE CV LAB;  Service: Cardiovascular;  Laterality: N/A;  . CARDIAC CATHETERIZATION N/A 11/22/2015   Procedure: Coronary Stent Intervention;  Surgeon: Marykay Lex, MD;  Location: [facility] INVASIVE CV LAB;  Service: Cardiovascular;  Laterality: N/A;  . CATARACT EXTRACTION, BILATERAL Bilateral   . CESAREAN SECTION  1977  . CORONARY ANGIOPLASTY WITH STENT PLACEMENT  "several"   "last one was 11/2014:  DES to the posterolateral branch /notes 11/20/2015  . CORONARY ARTERY BYPASS GRAFT  2003   [place]; CABG X 4  . DILATION AND CURETTAGE OF UTERUS    . KIDNEY STONE SURGERY     "opened me up"  . LAPAROSCOPIC CHOLECYSTECTOMY    . TONSILLECTOMY    . TUBAL LIGATION      Current Outpatient Medications  Medication Sig Dispense Refill  . albuterol (PROVENTIL HFA;VENTOLIN HFA) 108 (90 Base) MCG/ACT inhaler Inhale 2 puffs into the lungs every 6 (six) hours as needed for wheezing or shortness of breath. 1 Inhaler 0  . amLODipine (NORVASC) 5 MG tablet TAKE 1 TABLET BY MOUTH EVERY DAY 90 tablet 3  . aspirin 81 MG tablet Take 81 mg by mouth daily.    . clopidogrel (PLAVIX) 75 MG tablet TAKE 1 TABLET BY MOUTH EVERY DAY 90 tablet 3  . fluticasone (FLOVENT HFA) 44 MCG/ACT inhaler 2 puffs bid 1 Inhaler 11  . gabapentin (  NEURONTIN) 100 MG capsule Take one po TID 90 capsule 5  . HYDROcodone-acetaminophen (NORCO/VICODIN) 5-325 MG tablet Take 0.5-1 tablets by mouth every 6 (six) hours as needed. 24 tablet 0  . HYDROcodone-acetaminophen (NORCO/VICODIN) 5-325 MG tablet 1/2 to 1 po QID prn pain 30 tablet 0  . Ibuprofen-Diphenhydramine Cit (IBUPROFEN PM) 200-38 MG TABS Take 1 tablet by mouth at bedtime as needed.     . isosorbide mononitrate (IMDUR) 30 MG 24 hr tablet TAKE ONE TABLET BY MOUTH DAILY 30 tablet 6  . levothyroxine (SYNTHROID, LEVOTHROID) 75 MCG tablet TAKE ONE TABLET BY  MOUTH DAILY BEFORE BREAKFAST 90 tablet 1  . lisinopril (PRINIVIL,ZESTRIL) 20 MG tablet TAKE ONE TABLET BY MOUTH TWICE DAILY 180 tablet 3  . meclizine (ANTIVERT) 25 MG tablet Take 1 tablet (25 mg total) by mouth 3 (three) times daily as needed for dizziness. 30 tablet 0  . metoprolol succinate (TOPROL-XL) 25 MG 24 hr tablet TAKE 1/2 TABLET BY MOUTH EVERY EVENING 15 tablet 4  . nitroGLYCERIN (NITROSTAT) 0.4 MG SL tablet PLACE 1 TABLET UNDER TONGUE EVERY 5 MINUTES AS NEEDED FOR CHEST PAIN. IF NO RELIEF AFTER THE 3RD DOSE, PROCEED TO THE ER FOR AN EVALUATION 25 tablet 3  . ondansetron (ZOFRAN ODT) 4 MG disintegrating tablet Take 1 tablet (4 mg total) by mouth every 6 (six) hours as needed for nausea or vomiting. 20 tablet 0  . pantoprazole (PROTONIX) 40 MG tablet TAKE 1 TABLET BY MOUTH EVERY DAY 90 tablet 3  . PARoxetine (PAXIL) 30 MG tablet TAKE ONE TABLET BY MOUTH EVERY DAY 30 tablet 2  . rosuvastatin (CRESTOR) 20 MG tablet TAKE ONE TABLET BY MOUTH EVERY DAY FOR cholesterol 90 tablet 1   No current facility-administered medications for this visit.    Allergies:  Patient has no known allergies.   Social History: The patient  reports that she has never smoked. She has never used smokeless tobacco. She reports that she drinks alcohol. She reports that she does not use drugs.   ROS:  Please see the history of present illness. Otherwise, complete review of systems is positive for fatigue.  All other systems are reviewed and negative.   Physical Exam: VS:  BP 108/78   Pulse 91   Ht  (1.575 m)   Wt 221 lb (100.2 kg)   SpO2 98%   BMI 40.42 kg/m , BMI Body mass index is 40.42 kg/m.  Wt Readings from Last 3 Encounters:  12/24/17 221 lb (100.2 kg)  10/27/17 220 lb (99.8 kg)  08/12/17 217 lb (98.4 kg)    General: No distress or active chest pain. HEENT: Conjunctiva and lids normal, oropharynx clear. Neck: Supple, no elevated JVP or carotid bruits, no thyromegaly. Lungs: Clear to  auscultation, nonlabored breathing at rest. Cardiac: Regular rate and rhythm, no S3 or significant systolic murmur, no pericardial rub. Abdomen: Soft, nontender, bowel sounds present. Extremities: No pitting edema, distal pulses 2+. Skin: Warm and dry. Musculoskeletal: No kyphosis. Neuropsychiatric: Alert and oriented x3, affect grossly appropriate.  ECG: I personally reviewed the tracing from 07/23/2017 which shows sinus rhythm with LVH and repolarization abnormalities.  Recent Labwork: 04/20/2017: TSH 1.673 07/23/2017: BUN 17; Creatinine, Ser 1.19; Hemoglobin 12.2; Platelets 308; Potassium 3.5; Sodium 139 11/03/2017: ALT 13; AST 17     Component Value Date/Time   CHOL 141 11/03/2017 1043   TRIG 205 (H) 11/03/2017 1043   HDL 39 (L) 11/03/2017 1043   CHOLHDL 3.6 11/03/2017 1043   CHOLHDL 6.0 04/20/2017  1100   VLDL 69 (H) 04/20/2017 1100   LDLCALC 61 11/03/2017 1043    Other Studies Reviewed Today:  Echocardiogram 05/19/2017: Study Conclusions  - Left ventricle: The cavity size was normal. Wall thickness was   increased in a pattern of moderate LVH. Systolic function was   normal. The estimated ejection fraction was in the range of 55%   to 60%. Wall motion was normal; there were no regional wall   motion abnormalities. Doppler parameters are consistent with   abnormal left ventricular relaxation (grade 1 diastolic   dysfunction). There was no evidence of elevated ventricular   filling pressure by Doppler parameters. - Aortic valve: Mildly calcified annulus. Trileaflet; mildly   thickened leaflets. Valve area (VTI): 2.36 cm^2. Valve area   (Vmax): 1.98 cm^2. Valve area (Vmean): 2.13 cm^2. - Atrial septum: No defect or patent foramen ovale was identified. - Technically adequate study.  Cardiac catheterization and PCI 11/22/2015: 1. Prox RCA to Mid RCA lesion, 30% stenosed - stent stenosis was diffuse.. The lesion was previously treated with a stent (unknown type). 2. Mid RCA  lesion, 85% stenosed - tandem 85% lesions in the distal portion of the stent. Post angiosculpt balloon angioplasty, there is a 30% residual stenosis. The lesion was previously treated. 3. Post Atrio lesion, 90% stenosed. As previous described 4. Prox Cx to Mid Cx lesion, 70% stenosed - diffuse in-stent restenosis. Post Cutting Balloon angioplasty, there is a 20% residual stenosis. The lesion was previously treated with a stent (unknown type). 5. Mid Cx lesion, 95% stenosed. Post balloon angioplasty, there is a 20% residual stenosis. 6. Ost LAD to Prox LAD lesion, 100% stenosed. As previously described 7. LIMA is large, and is anatomically normal - with normal flow down relatively normal distal LAD. 8. SVGs were not injected . Known to be occluded 9. Ost 2nd Mrg lesion, 55% stenosed - jailed by original stent 10. The left ventricular systolic function is normal.  Apparently stable native coronary artery disease with known graft occlusion with exception of patent LIMA-LAD. Extensive stent work in the mid RCA and circumflex with in-stent restenosis treated with cutting or sculpt balloon angioplasty. The distal edge of the circumflex stent 95% lesion was treated with balloon plasty only. This is because of difficulty advancing besides a compliant balloon through the upstream stent. There was a near stent-like result.  Assessment and Plan:  1.  Accelerating angina in the setting of known multivessel CAD status post CABG with documented graft disease and follow-up PCI as of 2017.  Reports compliance with full antianginal regimen.  We have discussed a follow-up diagnostic cardiac catheterization to reevaluate for revascularization options.  Risks and benefits have been reviewed and she is in agreement to proceed.  2.  Essential hypertension, blood pressure well controlled today.  3.  Mixed hyperlipidemia, on Crestor.  LDL down to 61.  4.  History of asthma, managed by Dr. Gerda Diss.  Current medicines  were reviewed with the patient today.  Disposition: Follow-up after procedure.  Signed, Jonelle Sidle, MD, [facility] 12/24/2017 2:01 PM     [facility] [address] [place], [place], [place] [address] Phone: [phone]; Fax: [phone]

## 2017-12-22 NOTE — H&P (View-Only) (Signed)
  Cardiology Office Note  Date: 12/24/2017   ID: Lacey Bailey, DOB 07/16/1946, MRN [id number]  PCP: Luking, William S, MD  Primary Cardiologist: Samuel McDowell, MD   Chief Complaint  Patient presents with  . Coronary Artery Disease  . Chest Pain    History of Present Illness: Lacey Bailey is a 71 y.o. female last seen in October 2018.  She is here today with her husband for a follow-up visit.  She states that over the last 6 months she has had steadily declining stamina, also recurring angina symptoms particularly within the last several weeks with increased use of nitroglycerin.  This is despite being consistent with her regular medical regimen.  Patient is emotional in discussing her symptoms, tearful about the impact on her quality of life.  Current cardiac regimen includes aspirin, Plavix, Norvasc, Imdur, lisinopril, Toprol-XL, and Crestor.  I reviewed interval lab work from March.  Recent LDL 61.  Last cardiac catheterization was in March 2017 as detailed below.  At that time she underwent angiosculpt angioplasty within the mid RCA for treatment of in-stent restenosis as well as cutting balloon angioplasty in the proximal to mid circumflex for treatment of in-stent restenosis as well as angioplasty of the mid circumflex.  LIMA to LAD was patent and all vein grafts were known to be occluded.  Past Medical History:  Diagnosis Date  . Anginal pain (HCC)   . Aortic sclerosis   . CAD (coronary artery disease)    Multivessel status post CABG 2003 as well as multiple percutaneous interventions ([place]), DES to PLB 11/2014 ([facility])  . Depression   . Essential hypertension   . Hyperlipidemia   . Hypothyroidism   . Kidney stones   . TIA (transient ischemic attack) ~ 2013    Past Surgical History:  Procedure Laterality Date  . ABDOMINAL HYSTERECTOMY    . APPENDECTOMY    . BLEPHAROPLASTY Bilateral    "uppers"  . CARDIAC CATHETERIZATION  11/22/2015  . CARDIAC  CATHETERIZATION N/A 11/22/2015   Procedure: Left Heart Cath and Cors/Grafts Angiography;  Surgeon: David W Harding, MD;  Location: [facility];  Service: Cardiovascular;  Laterality: N/A;  . CARDIAC CATHETERIZATION N/A 11/22/2015   Procedure: Coronary Stent Intervention;  Surgeon: David W Harding, MD;  Location: [facility];  Service: Cardiovascular;  Laterality: N/A;  . CATARACT EXTRACTION, BILATERAL Bilateral   . CESAREAN SECTION  1977  . CORONARY ANGIOPLASTY WITH STENT PLACEMENT  "several"   "last one was 11/2014:  DES to the posterolateral branch /notes 11/20/2015  . CORONARY ARTERY BYPASS GRAFT  2003   [place]; CABG X 4  . DILATION AND CURETTAGE OF UTERUS    . KIDNEY STONE SURGERY     "opened me up"  . LAPAROSCOPIC CHOLECYSTECTOMY    . TONSILLECTOMY    . TUBAL LIGATION      Current Outpatient Medications  Medication Sig Dispense Refill  . albuterol (PROVENTIL HFA;VENTOLIN HFA) 108 (90 Base) MCG/ACT inhaler Inhale 2 puffs into the lungs every 6 (six) hours as needed for wheezing or shortness of breath. 1 Inhaler 0  . amLODipine (NORVASC) 5 MG tablet TAKE 1 TABLET BY MOUTH EVERY DAY 90 tablet 3  . aspirin 81 MG tablet Take 81 mg by mouth daily.    . clopidogrel (PLAVIX) 75 MG tablet TAKE 1 TABLET BY MOUTH EVERY DAY 90 tablet 3  . fluticasone (FLOVENT HFA) 44 MCG/ACT inhaler 2 puffs bid 1 Inhaler 11  . gabapentin (  NEURONTIN) 100 MG capsule Take one po TID 90 capsule 5  . HYDROcodone-acetaminophen (NORCO/VICODIN) 5-325 MG tablet Take 0.5-1 tablets by mouth every 6 (six) hours as needed. 24 tablet 0  . HYDROcodone-acetaminophen (NORCO/VICODIN) 5-325 MG tablet 1/2 to 1 po QID prn pain 30 tablet 0  . Ibuprofen-Diphenhydramine Cit (IBUPROFEN PM) 200-38 MG TABS Take 1 tablet by mouth at bedtime as needed.     . isosorbide mononitrate (IMDUR) 30 MG 24 hr tablet TAKE ONE TABLET BY MOUTH DAILY 30 tablet 6  . levothyroxine (SYNTHROID, LEVOTHROID) 75 MCG tablet TAKE ONE TABLET BY  MOUTH DAILY BEFORE BREAKFAST 90 tablet 1  . lisinopril (PRINIVIL,ZESTRIL) 20 MG tablet TAKE ONE TABLET BY MOUTH TWICE DAILY 180 tablet 3  . meclizine (ANTIVERT) 25 MG tablet Take 1 tablet (25 mg total) by mouth 3 (three) times daily as needed for dizziness. 30 tablet 0  . metoprolol succinate (TOPROL-XL) 25 MG 24 hr tablet TAKE 1/2 TABLET BY MOUTH EVERY EVENING 15 tablet 4  . nitroGLYCERIN (NITROSTAT) 0.4 MG SL tablet PLACE 1 TABLET UNDER TONGUE EVERY 5 MINUTES AS NEEDED FOR CHEST PAIN. IF NO RELIEF AFTER THE 3RD DOSE, PROCEED TO THE ER FOR AN EVALUATION 25 tablet 3  . ondansetron (ZOFRAN ODT) 4 MG disintegrating tablet Take 1 tablet (4 mg total) by mouth every 6 (six) hours as needed for nausea or vomiting. 20 tablet 0  . pantoprazole (PROTONIX) 40 MG tablet TAKE 1 TABLET BY MOUTH EVERY DAY 90 tablet 3  . PARoxetine (PAXIL) 30 MG tablet TAKE ONE TABLET BY MOUTH EVERY DAY 30 tablet 2  . rosuvastatin (CRESTOR) 20 MG tablet TAKE ONE TABLET BY MOUTH EVERY DAY FOR cholesterol 90 tablet 1   No current facility-administered medications for this visit.    Allergies:  Patient has no known allergies.   Social History: The patient  reports that she has never smoked. She has never used smokeless tobacco. She reports that she drinks alcohol. She reports that she does not use drugs.   ROS:  Please see the history of present illness. Otherwise, complete review of systems is positive for fatigue.  All other systems are reviewed and negative.   Physical Exam: VS:  BP 108/78   Pulse 91   Ht 5' 2" (1.575 m)   Wt 221 lb (100.2 kg)   SpO2 98%   BMI 40.42 kg/m , BMI Body mass index is 40.42 kg/m.  Wt Readings from Last 3 Encounters:  12/24/17 221 lb (100.2 kg)  10/27/17 220 lb (99.8 kg)  08/12/17 217 lb (98.4 kg)    General: No distress or active chest pain. HEENT: Conjunctiva and lids normal, oropharynx clear. Neck: Supple, no elevated JVP or carotid bruits, no thyromegaly. Lungs: Clear to  auscultation, nonlabored breathing at rest. Cardiac: Regular rate and rhythm, no S3 or significant systolic murmur, no pericardial rub. Abdomen: Soft, nontender, bowel sounds present. Extremities: No pitting edema, distal pulses 2+. Skin: Warm and dry. Musculoskeletal: No kyphosis. Neuropsychiatric: Alert and oriented x3, affect grossly appropriate.  ECG: I personally reviewed the tracing from 07/23/2017 which shows sinus rhythm with LVH and repolarization abnormalities.  Recent Labwork: 04/20/2017: TSH 1.673 07/23/2017: BUN 17; Creatinine, Ser 1.19; Hemoglobin 12.2; Platelets 308; Potassium 3.5; Sodium 139 11/03/2017: ALT 13; AST 17     Component Value Date/Time   CHOL 141 11/03/2017 1043   TRIG 205 (H) 11/03/2017 1043   HDL 39 (L) 11/03/2017 1043   CHOLHDL 3.6 11/03/2017 1043   CHOLHDL 6.0 04/20/2017   1100   VLDL 69 (H) 04/20/2017 1100   LDLCALC 61 11/03/2017 1043    Other Studies Reviewed Today:  Echocardiogram 05/19/2017: Study Conclusions  - Left ventricle: The cavity size was normal. Wall thickness was   increased in a pattern of moderate LVH. Systolic function was   normal. The estimated ejection fraction was in the range of 55%   to 60%. Wall motion was normal; there were no regional wall   motion abnormalities. Doppler parameters are consistent with   abnormal left ventricular relaxation (grade 1 diastolic   dysfunction). There was no evidence of elevated ventricular   filling pressure by Doppler parameters. - Aortic valve: Mildly calcified annulus. Trileaflet; mildly   thickened leaflets. Valve area (VTI): 2.36 cm^2. Valve area   (Vmax): 1.98 cm^2. Valve area (Vmean): 2.13 cm^2. - Atrial septum: No defect or patent foramen ovale was identified. - Technically adequate study.  Cardiac catheterization and PCI 11/22/2015: 1. Prox RCA to Mid RCA lesion, 30% stenosed - stent stenosis was diffuse.. The lesion was previously treated with a stent (unknown type). 2. Mid RCA  lesion, 85% stenosed - tandem 85% lesions in the distal portion of the stent. Post angiosculpt balloon angioplasty, there is a 30% residual stenosis. The lesion was previously treated. 3. Post Atrio lesion, 90% stenosed. As previous described 4. Prox Cx to Mid Cx lesion, 70% stenosed - diffuse in-stent restenosis. Post Cutting Balloon angioplasty, there is a 20% residual stenosis. The lesion was previously treated with a stent (unknown type). 5. Mid Cx lesion, 95% stenosed. Post balloon angioplasty, there is a 20% residual stenosis. 6. Ost LAD to Prox LAD lesion, 100% stenosed. As previously described 7. LIMA is large, and is anatomically normal - with normal flow down relatively normal distal LAD. 8. SVGs were not injected . Known to be occluded 9. Ost 2nd Mrg lesion, 55% stenosed - jailed by original stent 10. The left ventricular systolic function is normal.  Apparently stable native coronary artery disease with known graft occlusion with exception of patent LIMA-LAD. Extensive stent work in the mid RCA and circumflex with in-stent restenosis treated with cutting or sculpt balloon angioplasty. The distal edge of the circumflex stent 95% lesion was treated with balloon plasty only. This is because of difficulty advancing besides a compliant balloon through the upstream stent. There was a near stent-like result.  Assessment and Plan:  1.  Accelerating angina in the setting of known multivessel CAD status post CABG with documented graft disease and follow-up PCI as of 2017.  Reports compliance with full antianginal regimen.  We have discussed a follow-up diagnostic cardiac catheterization to reevaluate for revascularization options.  Risks and benefits have been reviewed and she is in agreement to proceed.  2.  Essential hypertension, blood pressure well controlled today.  3.  Mixed hyperlipidemia, on Crestor.  LDL down to 61.  4.  History of asthma, managed by Dr. Luking.  Current medicines  were reviewed with the patient today.  Disposition: Follow-up after procedure.  Signed, Samuel G. McDowell, MD, FACC 12/24/2017 2:01 PM    [facility] [place] [address], [place], [place] [address] Phone: [phone]; Fax: [phone] 

## 2017-12-24 ENCOUNTER — Ambulatory Visit: Payer: PPO | Admitting: Cardiology

## 2017-12-24 ENCOUNTER — Other Ambulatory Visit: Payer: Self-pay | Admitting: Cardiology

## 2017-12-24 ENCOUNTER — Encounter: Payer: Self-pay | Admitting: Cardiology

## 2017-12-24 ENCOUNTER — Telehealth: Payer: Self-pay | Admitting: Cardiology

## 2017-12-24 VITALS — BP 108/78 | HR 91 | Ht 62.0 in | Wt 221.0 lb

## 2017-12-24 DIAGNOSIS — I10 Essential (primary) hypertension: Secondary | ICD-10-CM

## 2017-12-24 DIAGNOSIS — E782 Mixed hyperlipidemia: Secondary | ICD-10-CM | POA: Diagnosis not present

## 2017-12-24 DIAGNOSIS — I200 Unstable angina: Secondary | ICD-10-CM | POA: Diagnosis not present

## 2017-12-24 DIAGNOSIS — I25119 Atherosclerotic heart disease of native coronary artery with unspecified angina pectoris: Secondary | ICD-10-CM | POA: Diagnosis not present

## 2017-12-24 NOTE — Telephone Encounter (Signed)
Pre-cert Verification for the following procedure   L heart cath 5/13 at 10:30 with [name]

## 2017-12-24 NOTE — Patient Instructions (Signed)
Medication Instructions:  Your physician recommends that you continue on your current medications as directed. Please refer to the Current Medication list given to you today.  Labwork: NONE  Testing/Procedures:   [facility] CARDIOVASCULAR DIVISION [facility] [place] [address] [place] [address] Dept: [phone] Loc: [phone]  Lacey Bailey  12/24/2017  You are scheduled for a Cardiac Catheterization on Monday, May 13 with Dr. Peter [name].  1. Please arrive at the [facility] (Main Entrance A) at [facility]: [address] [place], [place] [address] at 8:00 AM (two hours before your procedure to ensure your preparation). Free valet parking service is available.   Special note: Every effort is made to have your procedure done on time. Please understand that emergencies sometimes delay scheduled procedures.  2. Diet: Do not eat or drink anything after midnight prior to your procedure except sips of water to take medications.  3. Labs: Your labs will be performed at the hospital after you arrive for your procedure.  4. Medication instructions in preparation for your procedure:  On the morning of your procedure, take your Plavix/Clopidogrel and any morning medicines NOT listed above.  You may use sips of water.  5. Plan for one night stay--bring personal belongings. 6. Bring a current list of your medications and current insurance cards. 7. You MUST have a responsible person to drive you home. 8. Someone MUST be with you the first 24 hours after you arrive home or your discharge will be delayed. 9. Please wear clothes that are easy to get on and off and wear slip-on shoes.  Thank you for allowing [place] to care for you!   -- [facility]  Follow-Up: Your physician recommends that you schedule a follow-up appointment in: 1 MONTH WITH DR MCDOWELL.  Any Other Special  Instructions Will Be Listed Below (If Applicable).  If you need a refill on your cardiac medications before your next appointment, please call your pharmacy.

## 2017-12-28 ENCOUNTER — Other Ambulatory Visit: Payer: Self-pay | Admitting: Family Medicine

## 2017-12-30 ENCOUNTER — Telehealth: Payer: Self-pay | Admitting: *Deleted

## 2017-12-30 NOTE — Telephone Encounter (Addendum)
Catheterization scheduled at [facility] for: Monday May 13,2019 10:30 AM Verify arrival time and place: [facility] Main Entrance A at: 8 AM  No solid food after midnight prior to cath, clear liquids until 5 AM day of procedure. Verified no known allergies. Verified no diabetes medications.  Hold: Lisinopril 01/02/18 and 01/03/18 NSAIDS 01/02/18 PM, does not take in AM  Except hold medications AM meds can be  taken pre-cath with sip of water including: ASA 81 mg Clopidogrel 75 mg  Confirm patient has responsible person to drive home post procedure and observe patient for 24 hours: yes  I discussed instructions with patient, she verified understanding, thanked me for call.

## 2018-01-03 ENCOUNTER — Ambulatory Visit (HOSPITAL_COMMUNITY)
Admission: RE | Admit: 2018-01-03 | Discharge: 2018-01-03 | Disposition: A | Discharge status: Home / Self Care | Payer: PPO | Source: Ambulatory Visit | Attending: Cardiology | Admitting: Cardiology

## 2018-01-03 ENCOUNTER — Ambulatory Visit (HOSPITAL_COMMUNITY)
Admission: RE | Disposition: A | Discharge status: Home / Self Care | Payer: Self-pay | Source: Ambulatory Visit | Attending: Cardiology

## 2018-01-03 DIAGNOSIS — I25709 Atherosclerosis of coronary artery bypass graft(s), unspecified, with unspecified angina pectoris: Secondary | ICD-10-CM | POA: Diagnosis not present

## 2018-01-03 DIAGNOSIS — E039 Hypothyroidism, unspecified: Secondary | ICD-10-CM | POA: Diagnosis not present

## 2018-01-03 DIAGNOSIS — I209 Angina pectoris, unspecified: Secondary | ICD-10-CM | POA: Diagnosis present

## 2018-01-03 DIAGNOSIS — E782 Mixed hyperlipidemia: Secondary | ICD-10-CM | POA: Insufficient documentation

## 2018-01-03 DIAGNOSIS — E785 Hyperlipidemia, unspecified: Secondary | ICD-10-CM | POA: Diagnosis present

## 2018-01-03 DIAGNOSIS — T82855D Stenosis of coronary artery stent, subsequent encounter: Secondary | ICD-10-CM | POA: Diagnosis not present

## 2018-01-03 DIAGNOSIS — I2582 Chronic total occlusion of coronary artery: Secondary | ICD-10-CM | POA: Diagnosis not present

## 2018-01-03 DIAGNOSIS — Y831 Surgical operation with implant of artificial internal device as the cause of abnormal reaction of the patient, or of later complication, without mention of misadventure at the time of the procedure: Secondary | ICD-10-CM | POA: Insufficient documentation

## 2018-01-03 DIAGNOSIS — J45909 Unspecified asthma, uncomplicated: Secondary | ICD-10-CM | POA: Diagnosis not present

## 2018-01-03 DIAGNOSIS — F329 Major depressive disorder, single episode, unspecified: Secondary | ICD-10-CM | POA: Diagnosis not present

## 2018-01-03 DIAGNOSIS — I10 Essential (primary) hypertension: Secondary | ICD-10-CM | POA: Diagnosis not present

## 2018-01-03 DIAGNOSIS — Z7982 Long term (current) use of aspirin: Secondary | ICD-10-CM | POA: Diagnosis not present

## 2018-01-03 DIAGNOSIS — I25710 Atherosclerosis of autologous vein coronary artery bypass graft(s) with unstable angina pectoris: Secondary | ICD-10-CM | POA: Insufficient documentation

## 2018-01-03 DIAGNOSIS — Z7902 Long term (current) use of antithrombotics/antiplatelets: Secondary | ICD-10-CM | POA: Diagnosis not present

## 2018-01-03 DIAGNOSIS — Z8673 Personal history of transient ischemic attack (TIA), and cerebral infarction without residual deficits: Secondary | ICD-10-CM | POA: Insufficient documentation

## 2018-01-03 DIAGNOSIS — I200 Unstable angina: Secondary | ICD-10-CM

## 2018-01-03 HISTORY — PX: LEFT HEART CATH AND CORS/GRAFTS ANGIOGRAPHY: CATH118250

## 2018-01-03 LAB — CBC
HCT: 37.4 % (ref 36.0–46.0)
HEMOGLOBIN: 12.4 g/dL (ref 12.0–15.0)
MCH: 29.5 pg (ref 26.0–34.0)
MCHC: 33.2 g/dL (ref 30.0–36.0)
MCV: 89 fL (ref 78.0–100.0)
Platelets: 328 10*3/uL (ref 150–400)
RBC: 4.2 MIL/uL (ref 3.87–5.11)
RDW: 13.9 % (ref 11.5–15.5)
WBC: 8.9 10*3/uL (ref 4.0–10.5)

## 2018-01-03 LAB — BASIC METABOLIC PANEL
ANION GAP: 8 (ref 5–15)
BUN: 13 mg/dL (ref 6–20)
CHLORIDE: 108 mmol/L (ref 101–111)
CO2: 23 mmol/L (ref 22–32)
Calcium: 9.6 mg/dL (ref 8.9–10.3)
Creatinine, Ser: 1.33 mg/dL — ABNORMAL HIGH (ref 0.44–1.00)
GFR calc Af Amer: 45 mL/min — ABNORMAL LOW (ref >60)
GFR calc non Af Amer: 39 mL/min — ABNORMAL LOW (ref >60)
GLUCOSE: 103 mg/dL — AB (ref 65–99)
POTASSIUM: 4.8 mmol/L (ref 3.5–5.1)
Sodium: 139 mmol/L (ref 135–145)

## 2018-01-03 LAB — PROTIME-INR
INR: 0.94
PROTHROMBIN TIME: 12.5 s (ref 11.4–15.2)

## 2018-01-03 SURGERY — LEFT HEART CATH AND CORS/GRAFTS ANGIOGRAPHY
Anesthesia: LOCAL

## 2018-01-03 MED ORDER — SODIUM CHLORIDE 0.9 % IV SOLN: 250.0000 mL | INTRAVENOUS | Status: DC | PRN

## 2018-01-03 MED ORDER — HEPARIN (PORCINE) IN NACL 2-0.9 UNITS/ML
INTRAMUSCULAR | Status: AC | PRN
  Administered 2018-01-03 (×2): 500 mL

## 2018-01-03 MED ORDER — SODIUM CHLORIDE 0.9 % WEIGHT BASED INFUSION
3.0000 mL/kg/h | INTRAVENOUS | Status: AC
  Administered 2018-01-03: 3 mL/kg/h via INTRAVENOUS

## 2018-01-03 MED ORDER — DIAZEPAM 5 MG PO TABS
ORAL_TABLET | ORAL | Status: AC
  Administered 2018-01-03: 5 mg via ORAL
  Filled 2018-01-03: qty 1

## 2018-01-03 MED ORDER — VERAPAMIL HCL 2.5 MG/ML IV SOLN
INTRAVENOUS | Status: AC
  Filled 2018-01-03: qty 2

## 2018-01-03 MED ORDER — IOHEXOL 350 MG/ML SOLN
INTRAVENOUS | Status: DC | PRN
  Administered 2018-01-03: 65 mL

## 2018-01-03 MED ORDER — ACETAMINOPHEN 325 MG PO TABS: 650.0000 mg | ORAL_TABLET | ORAL | Status: DC | PRN

## 2018-01-03 MED ORDER — SODIUM CHLORIDE 0.9% FLUSH: 3.0000 mL | INTRAVENOUS | Status: DC | PRN

## 2018-01-03 MED ORDER — HEPARIN SODIUM (PORCINE) 1000 UNIT/ML IJ SOLN
INTRAMUSCULAR | Status: AC
  Filled 2018-01-03: qty 1

## 2018-01-03 MED ORDER — FENTANYL CITRATE (PF) 100 MCG/2ML IJ SOLN
INTRAMUSCULAR | Status: AC
  Filled 2018-01-03: qty 2

## 2018-01-03 MED ORDER — HEPARIN (PORCINE) IN NACL 1000-0.9 UT/500ML-% IV SOLN
INTRAVENOUS | Status: AC
  Filled 2018-01-03: qty 1000

## 2018-01-03 MED ORDER — HEPARIN SODIUM (PORCINE) 1000 UNIT/ML IJ SOLN
INTRAMUSCULAR | Status: DC | PRN
  Administered 2018-01-03: 5000 [IU] via INTRAVENOUS

## 2018-01-03 MED ORDER — ONDANSETRON HCL 4 MG/2ML IJ SOLN: 4.0000 mg | Freq: Four times a day (QID) | INTRAMUSCULAR | Status: DC | PRN

## 2018-01-03 MED ORDER — SODIUM CHLORIDE 0.9 % WEIGHT BASED INFUSION: 1.0000 mL/kg/h | INTRAVENOUS | Status: AC

## 2018-01-03 MED ORDER — DIAZEPAM 5 MG PO TABS
5.0000 mg | ORAL_TABLET | Freq: Once | ORAL | Status: AC
  Administered 2018-01-03: 5 mg via ORAL

## 2018-01-03 MED ORDER — ASPIRIN 81 MG PO CHEW: 81.0000 mg | CHEWABLE_TABLET | ORAL | Status: DC

## 2018-01-03 MED ORDER — MIDAZOLAM HCL 2 MG/2ML IJ SOLN
INTRAMUSCULAR | Status: AC
  Filled 2018-01-03: qty 2

## 2018-01-03 MED ORDER — SODIUM CHLORIDE 0.9% FLUSH: 3.0000 mL | Freq: Two times a day (BID) | INTRAVENOUS | Status: DC

## 2018-01-03 MED ORDER — LIDOCAINE HCL (PF) 1 % IJ SOLN
INTRAMUSCULAR | Status: DC | PRN
  Administered 2018-01-03: 2 mL

## 2018-01-03 MED ORDER — LIDOCAINE HCL (PF) 1 % IJ SOLN
INTRAMUSCULAR | Status: AC
  Filled 2018-01-03: qty 30

## 2018-01-03 MED ORDER — SODIUM CHLORIDE 0.9 % WEIGHT BASED INFUSION: 1.0000 mL/kg/h | INTRAVENOUS | Status: DC

## 2018-01-03 MED ORDER — VERAPAMIL HCL 2.5 MG/ML IV SOLN
INTRAVENOUS | Status: DC | PRN
  Administered 2018-01-03: 10 mL via INTRA_ARTERIAL

## 2018-01-03 MED ORDER — MIDAZOLAM HCL 2 MG/2ML IJ SOLN
INTRAMUSCULAR | Status: DC | PRN
  Administered 2018-01-03: 2 mg via INTRAVENOUS

## 2018-01-03 MED ORDER — FENTANYL CITRATE (PF) 100 MCG/2ML IJ SOLN
INTRAMUSCULAR | Status: DC | PRN
  Administered 2018-01-03: 25 ug via INTRAVENOUS

## 2018-01-03 SURGICAL SUPPLY — 13 items
CATH INFINITI 5 FR IM (CATHETERS) ×2 IMPLANT
CATH INFINITI 5FR MULTPACK ANG (CATHETERS) ×2 IMPLANT
COVER PRB 48X5XTLSCP FOLD TPE (BAG) ×1 IMPLANT
COVER PROBE 5X48 (BAG) ×1
DEVICE RAD COMP TR BAND LRG (VASCULAR PRODUCTS) ×2 IMPLANT
GLIDESHEATH SLEND SS 6F .021 (SHEATH) ×2 IMPLANT
GUIDEWIRE INQWIRE 1.5J.035X260 (WIRE) ×1 IMPLANT
INQWIRE 1.5J .035X260CM (WIRE) ×2
KIT HEART LEFT (KITS) ×2 IMPLANT
PACK CARDIAC CATHETERIZATION (CUSTOM PROCEDURE TRAY) ×2 IMPLANT
TRANSDUCER W/STOPCOCK (MISCELLANEOUS) ×2 IMPLANT
TUBING CIL FLEX 10 FLL-RA (TUBING) ×2 IMPLANT
WIRE HI TORQ VERSACORE-J 145CM (WIRE) ×2 IMPLANT

## 2018-01-03 NOTE — Interval H&P Note (Signed)
History and Physical Interval Note:  01/03/2018 10:31 AM  Lacey Bailey  has presented today for surgery, with the diagnosis of accelerating angina  The various methods of treatment have been discussed with the patient and family. After consideration of risks, benefits and other options for treatment, the patient has consented to  Procedure(s): LEFT HEART CATH AND CORS/GRAFTS ANGIOGRAPHY (N/A) as a surgical intervention .  The patient's history has been reviewed, patient examined, no change in status, stable for surgery.  I have reviewed the patient's chart and labs.  Questions were answered to the patient's satisfaction.     Theron Arista [facility] 01/03/2018 10:31 AM Cath Lab Visit (complete for each Cath Lab visit)  Clinical Evaluation Leading to the Procedure:   ACS: No.  Non-ACS:    Anginal Classification: CCS III  Anti-ischemic medical therapy: Maximal Therapy (2 or more classes of medications)  Non-Invasive Test Results: No non-invasive testing performed  Prior CABG: Previous CABG

## 2018-01-03 NOTE — Discharge Instructions (Signed)
Drink plenty of fluids over next 48 hours and keep left wrist elevated at heart level for 24 hours ° °Radial Site Care °Refer to this sheet in the next few weeks. These instructions provide you with information about caring for yourself after your procedure. Your health care provider may also give you more specific instructions. Your treatment has been planned according to current medical practices, but problems sometimes occur. Call your health care provider if you have any problems or questions after your procedure. °What can I expect after the procedure? °After your procedure, it is typical to have the following: °· Bruising at the radial site that usually fades within 1-2 weeks. °· Blood collecting in the tissue (hematoma) that may be painful to the touch. It should usually decrease in size and tenderness within 1-2 weeks. ° °Follow these instructions at home: °· Take medicines only as directed by your health care provider. °· You may shower 24-48 hours after the procedure or as directed by your health care provider. Remove the bandage (dressing) and gently wash the site with plain soap and water. Pat the area dry with a clean towel. Do not rub the site, because this may cause bleeding. °· Do not take baths, swim, or use a hot tub until your health care provider approves. °· Check your insertion site every day for redness, swelling, or drainage. °· Do not apply powder or lotion to the site. °· Do not flex or bend the affected arm for 24 hours or as directed by your health care provider. °· Do not push or pull heavy objects with the affected arm for 24 hours or as directed by your health care provider. °· Do not lift over 10 lb (4.5 kg) for 5 days after your procedure or as directed by your health care provider. °· Ask your health care provider when it is okay to: °? Return to work or school. °? Resume usual physical activities or sports. °? Resume sexual activity. °· Do not drive home if you are discharged the  same day as the procedure. Have someone else drive you. °· You may drive 24 hours after the procedure unless otherwise instructed by your health care provider. °· Do not operate machinery or power tools for 24 hours after the procedure. °· If your procedure was done as an outpatient procedure, which means that you went home the same day as your procedure, a responsible adult should be with you for the first 24 hours after you arrive home. °· Keep all follow-up visits as directed by your health care provider. This is important. °Contact a health care provider if: °· You have a fever. °· You have chills. °· You have increased bleeding from the radial site. Hold pressure on the site. °Get help right away if: °· You have unusual pain at the radial site. °· You have redness, warmth, or swelling at the radial site. °· You have drainage (other than a small amount of blood on the dressing) from the radial site. °· The radial site is bleeding, and the bleeding does not stop after 30 minutes of holding steady pressure on the site. °· Your arm or hand becomes pale, cool, tingly, or numb. °This information is not intended to replace advice given to you by your health care provider. Make sure you discuss any questions you have with your health care provider. °Document Released: 09/12/2010 Document Revised: 01/16/2016 Document Reviewed: 02/26/2014 °[employer] Interactive Patient Education © 2018 [employer] ° °

## 2018-01-04 ENCOUNTER — Encounter (HOSPITAL_COMMUNITY): Payer: Self-pay | Admitting: Cardiology

## 2018-01-05 ENCOUNTER — Institutional Professional Consult (permissible substitution): Payer: PPO | Admitting: Cardiothoracic Surgery

## 2018-01-05 ENCOUNTER — Other Ambulatory Visit: Payer: Self-pay

## 2018-01-05 ENCOUNTER — Encounter: Payer: Self-pay | Admitting: Cardiothoracic Surgery

## 2018-01-05 VITALS — BP 120/76 | HR 68 | Resp 16 | Ht 62.0 in | Wt 220.0 lb

## 2018-01-05 DIAGNOSIS — Z951 Presence of aortocoronary bypass graft: Secondary | ICD-10-CM | POA: Diagnosis not present

## 2018-01-05 DIAGNOSIS — I2510 Atherosclerotic heart disease of native coronary artery without angina pectoris: Secondary | ICD-10-CM

## 2018-01-05 DIAGNOSIS — Z9861 Coronary angioplasty status: Secondary | ICD-10-CM | POA: Diagnosis not present

## 2018-01-05 MED FILL — Heparin Sod (Porcine)-NaCl IV Soln 1000 Unit/500ML-0.9%: INTRAVENOUS | Qty: 1000 | Status: AC

## 2018-01-05 NOTE — Progress Notes (Signed)
PCP is Merlyn Albert, MD Referring Provider is [name], Kendale Rembold M, MD  Chief Complaint  Patient presents with  . Coronary Artery Disease    Surgical eval, Cardiac Cath 01/03/18,  HX of CABG, ECHO 05/09/17, PFT 05/07/17  Patient examined, most recent coronary angiogram images and echocardiogram images personally reviewed and counseled with patient and husband.  HPI: Very nice 72 year old 5 foot 220 pound nondiabetic female had CABG x4 in [place] 2003.  Left IMA to LAD and vein graft harvested from left leg with a long open incision.  She has been treated for progressive native coronary disease and vein graft disease over the past few years with stent placement and atherectomy.  She recently had symptoms of shortness of breath and fatigue with exertion and underwent cardiac catheterization by Dr. [name] via left radial artery.  This demonstrated known chronic occlusion of the vein grafts with restenosis of previously placed stents in the circumflex and right coronary artery circulation.  Mammary artery is widely patent and large.  LVEDP is normal.  EF is 55-60%.  Echo shows no significant valvular disease. The patient denies resting angina or nocturnal angina.  She is frustrated that she has to pace herself for her daily activities.  I reviewed the vessels carefully.  There are no adequate targets for redo bypass surgery.  I have recommended that she start cardiac rehab and anti-pen try to improve her exercise tolerance and conditioning and to continue with medical therapy.  She understands that weight loss would be most helpful in reducing her symptoms of fatigue and exercise intolerance. Past Medical History:  Diagnosis Date  . Anginal pain (HCC)   . Aortic sclerosis   . CAD (coronary artery disease)    Multivessel status post CABG 2003 as well as multiple percutaneous interventions [place] [place]), DES to PLB 11/2014 ([facility])  . Depression   . Essential hypertension   . Hyperlipidemia    . Hypothyroidism   . Kidney stones   . TIA (transient ischemic attack) ~ 2013    Past Surgical History:  Procedure Laterality Date  . ABDOMINAL HYSTERECTOMY    . APPENDECTOMY    . BLEPHAROPLASTY Bilateral    "uppers"  . CARDIAC CATHETERIZATION  11/22/2015  . CARDIAC CATHETERIZATION N/A 11/22/2015   Procedure: Left Heart Cath and Cors/Grafts Angiography;  Surgeon: Marykay Lex, MD;  Location: [facility] INVASIVE CV LAB;  Service: Cardiovascular;  Laterality: N/A;  . CARDIAC CATHETERIZATION N/A 11/22/2015   Procedure: Coronary Stent Intervention;  Surgeon: Marykay Lex, MD;  Location: [facility] INVASIVE CV LAB;  Service: Cardiovascular;  Laterality: N/A;  . CATARACT EXTRACTION, BILATERAL Bilateral   . CESAREAN SECTION  1977  . CORONARY ANGIOPLASTY WITH STENT PLACEMENT  "several"   "last one was 11/2014:  DES to the posterolateral branch /notes 11/20/2015  . CORONARY ARTERY BYPASS GRAFT  2003   [place]; CABG X 4  . DILATION AND CURETTAGE OF UTERUS    . KIDNEY STONE SURGERY     "opened me up"  . LAPAROSCOPIC CHOLECYSTECTOMY    . LEFT HEART CATH AND CORS/GRAFTS ANGIOGRAPHY N/A 01/03/2018   Procedure: LEFT HEART CATH AND CORS/GRAFTS ANGIOGRAPHY;  Surgeon: [name], Rondy Krupinski M, MD;  Location: [facility] INVASIVE CV LAB;  Service: Cardiovascular;  Laterality: N/A;  . TONSILLECTOMY    . TUBAL LIGATION      Family History  Problem Relation Age of Onset  . Heart attack Mother   . Heart disease Mother   . Heart disease Brother  Social History Social History   Tobacco Use  . Smoking status: Never Smoker  . Smokeless tobacco: Never Used  Substance Use Topics  . Alcohol use: Yes    Alcohol/week: 0.0 oz    Comment: 11/22/2015 "nothing since ~ 2005; occasionally had a drink w/dinner before 2005"  . Drug use: No    Current Outpatient Medications  Medication Sig Dispense Refill  . albuterol (PROVENTIL HFA;VENTOLIN HFA) 108 (90 Base) MCG/ACT inhaler Inhale 2 puffs into the lungs every 6 (six) hours as  needed for wheezing or shortness of breath. 1 Inhaler 0  . amLODipine (NORVASC) 5 MG tablet TAKE 1 TABLET BY MOUTH EVERY DAY 90 tablet 3  . aspirin 81 MG tablet Take 81 mg by mouth daily.    . fluticasone (FLOVENT HFA) 44 MCG/ACT inhaler 2 puffs bid (Patient taking differently: Inhale 2 puffs into the lungs 2 (two) times daily as needed (for shortness of breath). ) 1 Inhaler 11  . gabapentin (NEURONTIN) 100 MG capsule Take one po TID (Patient taking differently: Take 100 mg by mouth 3 (three) times daily as needed (for headache). ) 90 capsule 5  . HYDROcodone-acetaminophen (NORCO/VICODIN) 5-325 MG tablet Take 0.5-1 tablets by mouth every 6 (six) hours as needed. (Patient taking differently: Take 0.5 tablets by mouth every 6 (six) hours as needed for moderate pain. ) 24 tablet 0  . Ibuprofen-Diphenhydramine Cit (IBUPROFEN PM) 200-38 MG TABS Take 1 tablet by mouth at bedtime as needed (for headache).     . isosorbide mononitrate (IMDUR) 30 MG 24 hr tablet TAKE ONE TABLET BY MOUTH DAILY 30 tablet 6  . levothyroxine (SYNTHROID, LEVOTHROID) 75 MCG tablet TAKE ONE TABLET BY MOUTH DAILY BEFORE BREAKFAST 90 tablet 1  . lisinopril (PRINIVIL,ZESTRIL) 20 MG tablet TAKE ONE TABLET BY MOUTH TWICE DAILY 180 tablet 3  . metoprolol succinate (TOPROL-XL) 25 MG 24 hr tablet TAKE 1/2 TABLET BY MOUTH EVERY EVENING 15 tablet 4  . nitroGLYCERIN (NITROSTAT) 0.4 MG SL tablet PLACE 1 TABLET UNDER TONGUE EVERY 5 MINUTES AS NEEDED FOR CHEST PAIN. IF NO RELIEF AFTER THE 3RD DOSE, PROCEED TO THE ER FOR AN EVALUATION 25 tablet 3  . pantoprazole (PROTONIX) 40 MG tablet TAKE 1 TABLET BY MOUTH EVERY DAY 90 tablet 3  . PARoxetine (PAXIL) 30 MG tablet TAKE ONE TABLET BY MOUTH EVERY DAY 30 tablet 2  . rosuvastatin (CRESTOR) 20 MG tablet TAKE ONE TABLET BY MOUTH EVERY DAY FOR cholesterol 90 tablet 1   No current facility-administered medications for this visit.     No Known Allergies  Review of Systems        Review of Systems :   [ y ] = yes, [  ] = no        General :  Weight gain [   ]    Weight loss  [   ]  Fatigue Cove.Etienne  ]  Fever [  ]  Chills  [  ]                                Weakness  [  ]           HEENT    Headache [  ]  Dizziness [  ]  Blurred vision [  ] Glaucoma  [  ]  Nosebleeds [  ] Painful or loose teeth [  ]        Cardiac :  Chest pain/ pressure [  ]  Resting SOB [  ] exertional SOB Cove.Etienne  ] PFTs have been performed which were satisfactory                        Orthopnea [  ]  Pedal edema  [  ]  Palpitations [  ] Syncope/presyncope                         Paroxysmal nocturnal dyspnea [  ]         Pulmonary : cough [  ]  wheezing [  ]  Hemoptysis [  ] Sputum [  ] Snoring [  ]                              Pneumothorax [  ]  Sleep apnea [  ]        GI : Vomiting [  ]  Dysphagia [  ]  Melena  [  ]  Abdominal pain [  ] BRBPR [  ]              Heart burn [  ]  Constipation [  ] Diarrhea  [  ] Colonoscopy [   ]        GU : Hematuria [  ]  Dysuria [  ]  Nocturia [  ] UTI's [  ]        Vascular : Claudication [  ]  Rest pain [  ]  DVT [  ] Vein stripping [  ] leg ulcers [  ]                          TIA [  ] Stroke [  ]  Varicose veins [  ]        NEURO :  Headaches  [  ] Seizures [  ] Vision changes [  ] Paresthesias [  ]                                       Seizures [  ]        Musculoskeletal :  Arthritis [  ] Gout  [  ]  Back pain [  ]  Joint pain [  ]        Skin :  Rash [  ]  Melanoma [  ] Sores [  ]        Heme : Bleeding problems [  ]Clotting Disorders [  ] Anemia [  ]Blood Transfusion         Endocrine : Diabetes [  ] Heat or Cold intolerance [  ] Polyuria [  ]excessive thirst         Psych : Depression [  ]  Anxiety Cove.Etienne  ]  Psych hospitalizations [  ] Memory change [  ]       Patient did well after her previous CABG without sternal healing problems, arrhythmia or pleural effusion.  BP 120/76 (BP Location: Left Arm,  Patient Position: Sitting, Cuff Size: Large)   Pulse 68   Resp 16   Ht  (1.575 m)   Wt 220 lb (99.8 kg)   SpO2 98% Comment: RA  BMI 40.24 kg/m  Physical Exam      Exam    General- alert and comfortable, obese short 72 year old female no acute distress    Neck- no JVD, no cervical adenopathy palpable, no carotid bruit   Lungs- clear without rales, wheezes   Cor- regular rate and rhythm, no murmur , gallop   Abdomen- soft, non-tender   Extremities - warm, non-tender, minimal edema   Neuro- oriented, appropriate, no focal weakness  Diagnostic Tests: Cardiac images reviewed as noted above  Impression: Progression of coronary disease in her native vessels and vein grafts without any remaining surgical targets.  The mammary artery is large and widely patent and should provide at least another 20 years of patent blood flow to her heart.  Redo sternotomy would jeopardize the mammary artery which is providing sole blood flow to her coronary circulation  Plan: Patient should not have redo surgery because of poor targets.  She would most likely be in much worse condition than she is now after another sternotomy- bypass surgery..  Cardiac rehab and weight loss  are recommended.  We will refer the patient to the cardiac rehab program at [facility]. Mikey Bussing, MD [facility] [phone]

## 2018-01-06 NOTE — Addendum Note (Signed)
Addended by: Lynnae January R on: 01/06/2018 10:04 AM   Modules accepted: Orders

## 2018-01-11 ENCOUNTER — Telehealth: Payer: Self-pay

## 2018-01-11 NOTE — Telephone Encounter (Signed)
Lacey Bailey called concerned about referral for Cardiac Rehab at [facility].  She was advised that there was a referral made to Cardiac Rehab at [facility] and she would be contacted concerning start-up.  She acknowledged receipt.

## 2018-01-31 NOTE — Progress Notes (Signed)
Cardiology Office Note    Date:  02/01/2018   ID:  Marquitta, Persichetti 01/17/1946, MRN [id number]  PCP:  Mikey Kirschner, MD  Cardiologist: Rozann Lesches, MD    Chief Complaint  Patient presents with  . Follow-up    s/p cardiac catheterization    History of Present Illness:    Lacey Bailey is a 72 y.o. female with past medical history of CAD (s/p CABG in 2003, multiple interventions since at outside hospitals and most recent DES to PLB in 11/2014, angioplasty to mid-RCA and mid-LCx in 10/2015 with patent LIMA-LAD and occlusion of all vein grafts), HTN, HLD, and prior TIA who presents to the office today for follow-up from her recent cardiac catheterization.  She was last examined by Dr. Domenic Polite in 12/2017 and reported worsening fatigue over the past 6 months with worsening anginal episodes. Given reported compliance with her antianginal regimen, a repeat cardiac catheterization was recommended for definitive evaluation. This was performed on 01/03/2018 and showed patent LIMA-LAD, known occlusion of vein grafts and complete occlusion of previously placed stents to the LCx and RCA. Given the extensive work done on those vessels previously she was not felt to be a candidate for CTO PCI and CT Surgery consultation was recommended for consideration of re-do CABG.  She met with Dr. Prescott Gum two days later and was felt to not have any remaining surgical targets and medical therapy was recommended as re-do sternotomy would jeopardize the mammary artery. She was referred to cardiac rehab at [facility] for improved exercise tolerance and conditioning.   In talking with the patient and her husband today, she reports having worsening fatigue over the past 6 to 12 months but denies any associated chest discomfort or dyspnea on exertion. No recent orthopnea, PND, lower extremity edema, or palpitations.  She does not check her blood pressure regularly but it is well controlled at 110/76 during  today's visit. Reports compliance with her current medications.   Past Medical History:  Diagnosis Date  . Anginal pain ([place])   . Aortic sclerosis   . CAD (coronary artery disease)    a. s/p CABG in 2003 b. multiple interventions since at outside hospitals c. DES to PLB in 11/2014 d. angioplasty to mid-RCA and mid-LCx in 10/2015 with patent LIMA-LAD and occlusion of all vein grafts e. 12/2017: occlusion of vein grafts and native LCx and RCA with patent LIMA-LAD. Re-do CABG NOT recommended by CT surgery  . Depression   . Essential hypertension   . Hyperlipidemia   . Hypothyroidism   . Kidney stones   . TIA (transient ischemic attack) ~ 2013    Past Surgical History:  Procedure Laterality Date  . ABDOMINAL HYSTERECTOMY    . APPENDECTOMY    . BLEPHAROPLASTY Bilateral    "uppers"  . CARDIAC CATHETERIZATION  11/22/2015  . CARDIAC CATHETERIZATION N/A 11/22/2015   Procedure: Left Heart Cath and Cors/Grafts Angiography;  Surgeon: Leonie Man, MD;  Location: [facility] CV LAB;  Service: Cardiovascular;  Laterality: N/A;  . CARDIAC CATHETERIZATION N/A 11/22/2015   Procedure: Coronary Stent Intervention;  Surgeon: Leonie Man, MD;  Location: [place] CV LAB;  Service: Cardiovascular;  Laterality: N/A;  . CATARACT EXTRACTION, BILATERAL Bilateral   . CESAREAN SECTION  1977  . CORONARY ANGIOPLASTY WITH STENT PLACEMENT  "several"   "last one was 11/2014:  DES to the posterolateral branch /notes 11/20/2015  . CORONARY ARTERY BYPASS GRAFT  2003   [place]; CABG X  4  . DILATION AND CURETTAGE OF UTERUS    . KIDNEY STONE SURGERY     "opened me up"  . LAPAROSCOPIC CHOLECYSTECTOMY    . LEFT HEART CATH AND CORS/GRAFTS ANGIOGRAPHY N/A 01/03/2018   Procedure: LEFT HEART CATH AND CORS/GRAFTS ANGIOGRAPHY;  Surgeon: Martinique, Peter M, MD;  Location: [place] CV LAB;  Service: Cardiovascular;  Laterality: N/A;  . TONSILLECTOMY    . TUBAL LIGATION      Current Medications: Outpatient  Medications Prior to Visit  Medication Sig Dispense Refill  . albuterol (PROVENTIL HFA;VENTOLIN HFA) 108 (90 Base) MCG/ACT inhaler Inhale 2 puffs into the lungs every 6 (six) hours as needed for wheezing or shortness of breath. 1 Inhaler 0  . amLODipine (NORVASC) 5 MG tablet TAKE 1 TABLET BY MOUTH EVERY DAY 90 tablet 3  . aspirin 81 MG tablet Take 81 mg by mouth daily.    . fluticasone (FLOVENT HFA) 44 MCG/ACT inhaler 2 puffs bid (Patient taking differently: Inhale 2 puffs into the lungs 2 (two) times daily as needed (for shortness of breath). ) 1 Inhaler 11  . gabapentin (NEURONTIN) 100 MG capsule Take one po TID (Patient taking differently: Take 100 mg by mouth 3 (three) times daily as needed (for headache). ) 90 capsule 5  . HYDROcodone-acetaminophen (NORCO/VICODIN) 5-325 MG tablet Take 0.5-1 tablets by mouth every 6 (six) hours as needed. (Patient taking differently: Take 0.5 tablets by mouth every 6 (six) hours as needed for moderate pain. ) 24 tablet 0  . Ibuprofen-Diphenhydramine Cit (IBUPROFEN PM) 200-38 MG TABS Take 1 tablet by mouth at bedtime as needed (for headache).     . isosorbide mononitrate (IMDUR) 30 MG 24 hr tablet TAKE ONE TABLET BY MOUTH DAILY 30 tablet 6  . levothyroxine (SYNTHROID, LEVOTHROID) 75 MCG tablet TAKE ONE TABLET BY MOUTH DAILY BEFORE BREAKFAST 90 tablet 1  . lisinopril (PRINIVIL,ZESTRIL) 20 MG tablet TAKE ONE TABLET BY MOUTH TWICE DAILY 180 tablet 3  . metoprolol succinate (TOPROL-XL) 25 MG 24 hr tablet TAKE 1/2 TABLET BY MOUTH EVERY EVENING 15 tablet 4  . nitroGLYCERIN (NITROSTAT) 0.4 MG SL tablet PLACE 1 TABLET UNDER TONGUE EVERY 5 MINUTES AS NEEDED FOR CHEST PAIN. IF NO RELIEF AFTER THE 3RD DOSE, PROCEED TO THE ER FOR AN EVALUATION 25 tablet 3  . pantoprazole (PROTONIX) 40 MG tablet TAKE 1 TABLET BY MOUTH EVERY DAY 90 tablet 3  . PARoxetine (PAXIL) 30 MG tablet TAKE ONE TABLET BY MOUTH EVERY DAY 30 tablet 2  . rosuvastatin (CRESTOR) 20 MG tablet TAKE ONE TABLET BY  MOUTH EVERY DAY FOR cholesterol 90 tablet 1   No facility-administered medications prior to visit.      Allergies:   Patient has no known allergies.   Social History   Socioeconomic History  . Marital status: Married    Spouse name: Not on file  . Number of children: Not on file  . Years of education: Not on file  . Highest education level: Not on file  Occupational History  . Not on file  Social Needs  . Financial resource strain: Not on file  . Food insecurity:    Worry: Not on file    Inability: Not on file  . Transportation needs:    Medical: Not on file    Non-medical: Not on file  Tobacco Use  . Smoking status: Never Smoker  . Smokeless tobacco: Never Used  Substance and Sexual Activity  . Alcohol use: Yes    Alcohol/week: 0.0  oz    Comment: 11/22/2015 "nothing since ~ 2005; occasionally had a drink w/dinner before 2005"  . Drug use: No  . Sexual activity: Not Currently  Lifestyle  . Physical activity:    Days per week: Not on file    Minutes per session: Not on file  . Stress: Not on file  Relationships  . Social connections:    Talks on phone: Not on file    Gets together: Not on file    Attends religious service: Not on file    Active member of club or organization: Not on file    Attends meetings of clubs or organizations: Not on file    Relationship status: Not on file  Other Topics Concern  . Not on file  Social History Narrative  . Not on file     Family History:  The patient's family history includes Heart attack in her mother; Heart disease in her brother and mother.   Review of Systems:   Please see the history of present illness.     General:  No chills, fever, night sweats or weight changes. Positive for worsening fatigue.  Cardiovascular:  No chest pain, dyspnea on exertion, edema, orthopnea, palpitations, paroxysmal nocturnal dyspnea. Dermatological: No rash, lesions/masses Respiratory: No cough, dyspnea Urologic: No hematuria,  dysuria Abdominal:   No nausea, vomiting, diarrhea, bright red blood per rectum, melena, or hematemesis Neurologic:  No visual changes, wkns, changes in mental status. All other systems reviewed and are otherwise negative except as noted above.   Physical Exam:    VS:  BP 110/76   Pulse 62   Ht _0  (1.575 m)   Wt 222 lb 3.2 oz (100.8 kg)   SpO2 96%   BMI 40.64 kg/m    General: Well developed, well nourished Caucasian female appearing in no acute distress. Head: Normocephalic, atraumatic, sclera non-icteric, no xanthomas, nares are without discharge.  Neck: No carotid bruits. JVD not elevated.  Lungs: Respirations regular and unlabored, without wheezes or rales.  Heart: Regular rate and rhythm. No S3 or S4.  No murmur, no rubs, or gallops appreciated. Abdomen: Soft, non-tender, non-distended with normoactive bowel sounds. No hepatomegaly. No rebound/guarding. No obvious abdominal masses. Msk:  Strength and tone appear normal for age. No joint deformities or effusions. Extremities: No clubbing or cyanosis. No edema.  Distal pedal pulses are 2+ bilaterally. Radial cath site without ecchymosis or evidence of a hematoma.  Neuro: Alert and oriented X 3. Moves all extremities spontaneously. No focal deficits noted. Psych:  Responds to questions appropriately with a normal affect. Skin: No rashes or lesions noted  Wt Readings from Last 3 Encounters:  02/01/18 222 lb 3.2 oz (100.8 kg)  01/05/18 220 lb (99.8 kg)  01/03/18 220 lb (99.8 kg)     Studies/Labs Reviewed:   EKG:  EKG is not ordered today.   Recent Labs: 04/20/2017: TSH 1.673 11/03/2017: ALT 13 01/03/2018: BUN 13; Creatinine, Ser 1.33; Hemoglobin 12.4; Platelets 328; Potassium 4.8; Sodium 139   Lipid Panel    Component Value Date/Time   CHOL 141 11/03/2017 1043   TRIG 205 (H) 11/03/2017 1043   HDL 39 (L) 11/03/2017 1043   CHOLHDL 3.6 11/03/2017 1043   CHOLHDL 6.0 04/20/2017 1100   VLDL 69 (H) 04/20/2017 1100   LDLCALC  61 11/03/2017 1043    Additional studies/ records that were reviewed today include:   Cardiac Catheterization: 01/03/2018  Post Atrio lesion is 20% stenosed.  Prox RCA to Mid RCA lesion  is 100% stenosed.  Prox Cx to Dist Cx lesion is 100% stenosed.  Ost LAD to Prox LAD lesion is 100% stenosed.  LIMA graft was visualized by angiography and is large.  The graft exhibits no disease.  SVG graft was visualized by angiography.  SVG graft was visualized by angiography.  SVG graft was visualized by angiography.  Origin lesion is 100% stenosed.  Origin to Prox Graft lesion is 100% stenosed.  Origin to Prox Graft lesion is 100% stenosed.  The left ventricular systolic function is normal.  LV end diastolic pressure is normal.  The left ventricular ejection fraction is 55-65% by visual estimate.   1. Severe 3 vessel occlusive CAD 2. Patent LIMA to the LAD 3. Occluded SVG to diagonal 4. Occluded SVG to the OM2 5. Occluded SVG to the PDA 6. Good LV function 7. Normal LVEDP  Plan: Compared to prior cardiac cath in March 2017 the stents in the mid to distal LCx and mid RCA are completely occluded. Given extensive work done on these vessels previously she is not a candidate for CTO PCI. The only consideration would be whether she is a candidate for redo CABG. Her RCA appears graftable. Based on old films the OM2 may be graftable. I would obtain surgical consultation. Will stop Plavix. Continue other medical therapy.   Assessment:    1. Coronary artery disease involving native coronary artery of native heart with angina pectoris ([place])   2. Essential hypertension   3. Mixed hyperlipidemia      Plan:   In order of problems listed above:  1. CAD - s/p CABG in 2003 with multiple interventions since and most recent catheterization on 01/03/2018 showed patent LIMA-LAD with known occlusion of vein grafts and complete occlusion of previously placed stents to the LCx and RCA. Given  the extensive work done on those vessels previously she was not felt to be a candidate for CTO PCI and CT Surgery consultation was recommended for consideration of re-do CABG. Met with Dr. Prescott Gum who recommended against re-do CABG and a referral to Cardiac Rehab was recommended.  -She continues to have fatigue but denies any exertional chest pain or dyspnea on exertion.  Her recent catheterization report was reviewed in detail including that she does have collateral flow supplying the RCA and branches of the LCx.  - will continue on ASA, Amlodipine, Imdur, and Toprol-XL. Agree with recommendations to cardiac rehab (patient reports she is scheduled for an initial evaluation tomorrow but I do not see this in the system).   2. HTN - BP is well controlled at 110/76 during today's visit. - Continue Amlodipine, Imdur, Lisinopril, and Toprol-XL at current dosing.  3. HLD - Followed by PCP. FLP in 10/2017 showed total cholesterol 141, triglycerides 205, HDL 39, and LDL 61.  At goal of LDL less than 70. - continue Crestor 41m daily.    Medication Adjustments/Labs and Tests Ordered: Current medicines are reviewed at length with the patient today.  Concerns regarding medicines are outlined above.  Medication changes, Labs and Tests ordered today are listed in the Patient Instructions below. Patient Instructions  Medication Instructions:  Your physician recommends that you continue on your current medications as directed. Please refer to the Current Medication list given to you today.  Labwork: NONE  Testing/Procedures: NONE  Follow-Up: Your physician recommends that you schedule a follow-up appointment in: 3-4 Months with Dr. MDomenic Polite  Any Other Special Instructions Will Be Listed Below (If Applicable).  If you  need a refill on your cardiac medications before your next appointment, please call your pharmacy.  Thank you for choosing [facility]!    Signed, Erma Heritage,  PA-C  02/01/2018 2:08 PM    [address] [place], [place] [address] Phone: [phone]

## 2018-02-01 ENCOUNTER — Encounter: Payer: Self-pay | Admitting: Student

## 2018-02-01 ENCOUNTER — Ambulatory Visit: Payer: PPO | Admitting: Student

## 2018-02-01 VITALS — BP 110/76 | HR 62 | Ht 62.0 in | Wt 222.2 lb

## 2018-02-01 DIAGNOSIS — I25119 Atherosclerotic heart disease of native coronary artery with unspecified angina pectoris: Secondary | ICD-10-CM

## 2018-02-01 DIAGNOSIS — I10 Essential (primary) hypertension: Secondary | ICD-10-CM | POA: Diagnosis not present

## 2018-02-01 DIAGNOSIS — E782 Mixed hyperlipidemia: Secondary | ICD-10-CM

## 2018-02-01 NOTE — Patient Instructions (Signed)
Medication Instructions:  Your physician recommends that you continue on your current medications as directed. Please refer to the Current Medication list given to you today.   Labwork: NONE  Testing/Procedures: NONE  Follow-Up: Your physician recommends that you schedule a follow-up appointment in: 3-4 Months with Dr. Diona BrownerMcDowell    Any Other Special Instructions Will Be Listed Below (If Applicable).     If you need a refill on your cardiac medications before your next appointment, please call your pharmacy.  Thank you for choosing  [facility]!

## 2018-02-14 ENCOUNTER — Other Ambulatory Visit: Payer: Self-pay | Admitting: Cardiology

## 2018-02-26 ENCOUNTER — Other Ambulatory Visit: Payer: Self-pay | Admitting: Cardiology

## 2018-02-26 ENCOUNTER — Other Ambulatory Visit: Payer: Self-pay | Admitting: Family Medicine

## 2018-03-24 ENCOUNTER — Telehealth: Payer: Self-pay | Admitting: *Deleted

## 2018-03-24 NOTE — Telephone Encounter (Signed)
-----   Message from Jonelle SidleSamuel G McDowell, MD sent at 03/24/2018  8:23 AM EDT ----- Regarding: RE: Patient information Thank you for the information.  She may need to have medication adjustments, and should have orthostatic vital signs checked as well. I am not sure where her visit is scheduled, will forward this to nursing to make sure that she does have a visit in the near future.  ----- Message ----- From: Rolene Courseoad, Diane B Sent: 03/23/2018   6:36 PM To: Jonelle SidleSamuel G McDowell, MD Subject: Patient information                            Dr. Diona BrownerMcDowell, Patient shared that she would coming to see you in a few days so I wanted to make you aware that she has been experiencing some dizziness when standing, getting out of her car, and after exercise. It subsides after she is still for a few moments. On the days that she has an episode her BP's are low. They have been 94/60, 100/60, 92/62, and today (03/23/18) 90/50. She is in the non-monitored maintenance program. I told her I would let you know she was experiencing this and I'd let you know about her low BP's. Please advise if there is something you would like for us to do. Thanks so much Hart Rochesteriane Coad, Production designer, theatre/television/filmManager

## 2018-03-25 ENCOUNTER — Telehealth: Payer: Self-pay | Admitting: Cardiology

## 2018-03-25 NOTE — Telephone Encounter (Signed)
Pt received phone call yesterday, please return her call.

## 2018-03-25 NOTE — Telephone Encounter (Signed)
Apt made 04/04/18 at 11:30 am with Leda GauzeM Lenze PA-C

## 2018-03-29 ENCOUNTER — Other Ambulatory Visit: Payer: Self-pay | Admitting: Family Medicine

## 2018-03-29 ENCOUNTER — Other Ambulatory Visit: Payer: Self-pay | Admitting: Nurse Practitioner

## 2018-03-30 DIAGNOSIS — I959 Hypotension, unspecified: Secondary | ICD-10-CM | POA: Insufficient documentation

## 2018-03-30 NOTE — Progress Notes (Signed)
Cardiology Office Note    Date:  04/04/2018   ID:  Myron, Lona Aug 28, 1945, MRN [id number]  PCP:  Merlyn Albert, MD  Cardiologist: Nona Dell, MD  No chief complaint on file.   History of Present Illness:  Lacey Bailey is a 72 y.o. female with history of CAD status post CABG in 2003 with multiple interventions at outside hospital since then, DES to the PLB 11/2014, PTCA to the mid RCA and mid circumflex 10/2015 with patent LIMA to the LAD and occlusion of all SVG grafts.  Patient had worsening fatigue and underwent repeat cardiac catheterization 01/03/2018 that showed patent LIMA to the LAD, known occlusion of S SVGs and complete occlusion of previously placed stent in the circumflex and RCA.  Given the extensive work she is had done on her vessels she was not felt to be a candidate for CTO, PCI.  Dr. Maren Beach did not feel like she had any remaining surgical targets and recommend medical therapy.  She is doing maintenance program in cardiac rehab.  Also has hypertension, HLD, TIA.  Patient saw Randall An, PA-C/11/19 at which time she was only complaining of fatigue.  No changes were made that day.  Patient was added on to my schedule today because of dizziness and low blood pressures.  She has been going to cardiac rehab and brought a list of her blood pressures and and they really go over 100 systolic mostly in the 90s.  She was dizzy this morning when she took her shower.  He has not passed out.  Denies angina.  Drinks a cup of coffee and 3-4 sweet teas daily as well as tangerine lemonade.  Does not think she is dehydrated.  No blood in her stools and she was not anemic in May.  Past Medical History:  Diagnosis Date  . Anginal pain (HCC)   . Aortic sclerosis   . CAD (coronary artery disease)    a. s/p CABG in 2003 b. multiple interventions since at outside hospitals c. DES to PLB in 11/2014 d. angioplasty to mid-RCA and mid-LCx in 10/2015 with patent LIMA-LAD and  occlusion of all vein grafts e. 12/2017: occlusion of vein grafts and native LCx and RCA with patent LIMA-LAD. Re-do CABG NOT recommended by CT surgery  . Depression   . Essential hypertension   . Hyperlipidemia   . Hypothyroidism   . Kidney stones   . TIA (transient ischemic attack) ~ 2013    Past Surgical History:  Procedure Laterality Date  . ABDOMINAL HYSTERECTOMY    . APPENDECTOMY    . BLEPHAROPLASTY Bilateral    "uppers"  . CARDIAC CATHETERIZATION  11/22/2015  . CARDIAC CATHETERIZATION N/A 11/22/2015   Procedure: Left Heart Cath and Cors/Grafts Angiography;  Surgeon: Marykay Lex, MD;  Location: [facility] INVASIVE CV LAB;  Service: Cardiovascular;  Laterality: N/A;  . CARDIAC CATHETERIZATION N/A 11/22/2015   Procedure: Coronary Stent Intervention;  Surgeon: Marykay Lex, MD;  Location: [facility] INVASIVE CV LAB;  Service: Cardiovascular;  Laterality: N/A;  . CATARACT EXTRACTION, BILATERAL Bilateral   . CESAREAN SECTION  1977  . CORONARY ANGIOPLASTY WITH STENT PLACEMENT  "several"   "last one was 11/2014:  DES to the posterolateral branch /notes 11/20/2015  . CORONARY ARTERY BYPASS GRAFT  2003   [place]; CABG X 4  . DILATION AND CURETTAGE OF UTERUS    . KIDNEY STONE SURGERY     "opened me up"  . LAPAROSCOPIC CHOLECYSTECTOMY    .  LEFT HEART CATH AND CORS/GRAFTS ANGIOGRAPHY N/A 01/03/2018   Procedure: LEFT HEART CATH AND CORS/GRAFTS ANGIOGRAPHY;  Surgeon: [name], Peter M, MD;  Location: [facility] INVASIVE CV LAB;  Service: Cardiovascular;  Laterality: N/A;  . TONSILLECTOMY    . TUBAL LIGATION      Current Medications: Current Meds  Medication Sig  . albuterol (PROVENTIL HFA;VENTOLIN HFA) 108 (90 Base) MCG/ACT inhaler Inhale 2 puffs into the lungs every 6 (six) hours as needed for wheezing or shortness of breath.  Marland Kitchen amLODipine (NORVASC) 5 MG tablet TAKE ONE TABLET BY MOUTH EVERY DAY  . aspirin 81 MG tablet Take 81 mg by mouth daily.  . fluticasone (FLOVENT HFA) 44 MCG/ACT inhaler 2 puffs bid  (Patient taking differently: Inhale 2 puffs into the lungs 2 (two) times daily as needed (for shortness of breath). )  . gabapentin (NEURONTIN) 100 MG capsule Take one po TID (Patient taking differently: Take 100 mg by mouth 3 (three) times daily as needed (for headache). )  . HYDROcodone-acetaminophen (NORCO/VICODIN) 5-325 MG tablet Take 0.5-1 tablets by mouth every 6 (six) hours as needed. (Patient taking differently: Take 0.5 tablets by mouth every 6 (six) hours as needed for moderate pain. )  . Ibuprofen-Diphenhydramine Cit (IBUPROFEN PM) 200-38 MG TABS Take 1 tablet by mouth at bedtime as needed (for headache).   . isosorbide mononitrate (IMDUR) 30 MG 24 hr tablet TAKE ONE TABLET BY MOUTH DAILY  . levothyroxine (SYNTHROID, LEVOTHROID) 75 MCG tablet TAKE ONE TABLET BY MOUTH DAILY BEFORE BREAKFAST  . metoprolol succinate (TOPROL-XL) 25 MG 24 hr tablet TAKE 1/2 TABLET BY MOUTH EVERY EVENING  . nitroGLYCERIN (NITROSTAT) 0.4 MG SL tablet PLACE 1 TABLET UNDER TONGUE EVERY 5 MINUTES AS NEEDED FOR CHEST PAIN. IF NO RELIEF AFTER THE 3RD DOSE, PROCEED TO THE ER FOR AN EVALUATION  . pantoprazole (PROTONIX) 40 MG tablet TAKE ONE TABLET BY MOUTH EVERY DAY  . PARoxetine (PAXIL) 30 MG tablet TAKE 1 TABLET BY MOUTH EVERY DAY  . rosuvastatin (CRESTOR) 20 MG tablet TAKE ONE TABLET BY MOUTH EVERY DAY FOR cholesterol  . [DISCONTINUED] lisinopril (PRINIVIL,ZESTRIL) 20 MG tablet TAKE ONE TABLET BY MOUTH TWICE DAILY     Allergies:   Patient has no known allergies.   Social History   Socioeconomic History  . Marital status: Married    Spouse name: Not on file  . Number of children: Not on file  . Years of education: Not on file  . Highest education level: Not on file  Occupational History  . Not on file  Social Needs  . Financial resource strain: Not on file  . Food insecurity:    Worry: Not on file    Inability: Not on file  . Transportation needs:    Medical: Not on file    Non-medical: Not on file    Tobacco Use  . Smoking status: Never Smoker  . Smokeless tobacco: Never Used  Substance and Sexual Activity  . Alcohol use: Yes    Alcohol/week: 0.0 standard drinks    Comment: 11/22/2015 "nothing since ~ 2005; occasionally had a drink w/dinner before 2005"  . Drug use: No  . Sexual activity: Not Currently  Lifestyle  . Physical activity:    Days per week: Not on file    Minutes per session: Not on file  . Stress: Not on file  Relationships  . Social connections:    Talks on phone: Not on file    Gets together: Not on file  Attends religious service: Not on file    Active member of club or organization: Not on file    Attends meetings of clubs or organizations: Not on file    Relationship status: Not on file  Other Topics Concern  . Not on file  Social History Narrative  . Not on file     Family History:  The patient's family history includes Heart attack in her mother; Heart disease in her brother and mother.   ROS:   Please see the history of present illness.    Review of Systems  Constitution: Positive for malaise/fatigue.  HENT: Negative.   Cardiovascular: Negative.   Respiratory: Negative.   Endocrine: Negative.   Hematologic/Lymphatic: Negative.   Musculoskeletal: Negative.   Gastrointestinal: Negative.   Genitourinary: Negative.   Neurological: Positive for dizziness and weakness.   All other systems reviewed and are negative.   PHYSICAL EXAM:   VS:  BP 110/75 (BP Location: Left Arm, Cuff Size: Large)   Pulse 76   Ht 5\' 2"  (1.575 m)   Wt 222 lb 9.6 oz (101 kg)   SpO2 95%   BMI 40.71 kg/m   Physical Exam  GEN: Well nourished, well developed, in no acute distress  Neck: no JVD, carotid bruits, or masses Cardiac:RRR; positive S4, 1/6 systolic murmur left sternal border Respiratory:  clear to auscultation bilaterally, normal work of breathing GI: soft, nontender, nondistended, + BS Ext: without cyanosis, clubbing, or edema, Good distal pulses  bilaterally Neuro:  Alert and Oriented x 3 Psych: euthymic mood, full affect  Wt Readings from Last 3 Encounters:  04/04/18 222 lb 9.6 oz (101 kg)  02/01/18 222 lb 3.2 oz (100.8 kg)  01/05/18 220 lb (99.8 kg)      Studies/Labs Reviewed:   EKG:  EKG is not ordered today.  Recent Labs: 04/20/2017: TSH 1.673 11/03/2017: ALT 13 01/03/2018: BUN 13; Creatinine, Ser 1.33; Hemoglobin 12.4; Platelets 328; Potassium 4.8; Sodium 139   Lipid Panel    Component Value Date/Time   CHOL 141 11/03/2017 1043   TRIG 205 (H) 11/03/2017 1043   HDL 39 (L) 11/03/2017 1043   CHOLHDL 3.6 11/03/2017 1043   CHOLHDL 6.0 04/20/2017 1100   VLDL 69 (H) 04/20/2017 1100   LDLCALC 61 11/03/2017 1043    Additional studies/ records that were reviewed today include:  Cardiac Catheterization: 01/03/2018  Post Atrio lesion is 20% stenosed.  Prox RCA to Mid RCA lesion is 100% stenosed.  Prox Cx to Dist Cx lesion is 100% stenosed.  Ost LAD to Prox LAD lesion is 100% stenosed.  LIMA graft was visualized by angiography and is large.  The graft exhibits no disease.  SVG graft was visualized by angiography.  SVG graft was visualized by angiography.  SVG graft was visualized by angiography.  Origin lesion is 100% stenosed.  Origin to Prox Graft lesion is 100% stenosed.  Origin to Prox Graft lesion is 100% stenosed.  The left ventricular systolic function is normal.  LV end diastolic pressure is normal.  The left ventricular ejection fraction is 55-65% by visual estimate.   1. Severe 3 vessel occlusive CAD 2. Patent LIMA to the LAD 3. Occluded SVG to diagonal 4. Occluded SVG to the OM2 5. Occluded SVG to the PDA 6. Good LV function 7. Normal LVEDP   Plan: Compared to prior cardiac cath in March 2017 the stents in the mid to distal LCx and mid RCA are completely occluded. Given extensive work done on these vessels previously  she is not a candidate for CTO PCI. The only consideration would be  whether she is a candidate for redo CABG. Her RCA appears graftable. Based on old films the OM2 may be graftable. I would obtain surgical consultation. Will stop Plavix. Continue other medical therapy.         ASSESSMENT:    1. Orthostatic hypotension   2. Atherosclerosis of coronary artery bypass graft with angina pectoris, unspecified whether native or transplanted heart (HCC)   3. Hyperlipidemia with target LDL less than 70      PLAN:  In order of problems listed above:  Orthostatic hypotension patient's blood pressure is dropping today in the office.  Blood pressures have also been running low in general.  We will decrease lisinopril to 20 mg once daily.  We will have her come in the office next week for repeat orthostatics.  If there is still low we can DC increase lisinopril to 10 mg daily and after that back off on amlodipine if needed.  I also asked her to decrease caffeine intake with all the sweet tea that she drinks.  Follow-up with myself in 1 month and Dr. Diona BrownerMcdowell in 4 to 5 months  CAD status post CABG in 2003 with multiple interventions since then.  Most recent cath 01/03/2018 patent LIMA to the LAD with all SVG grafts occluded and occlusion of stent in the circumflex and RCA.  Not felt to be a candidate for PCI and no good targets for redo CABG.  Medical therapy recommended.  Currently in cardiac rehab which she says is helping.  Hyperlipidemia continue Crestor LDL at goal 61- 10/2015  Medication Adjustments/Labs and Tests Ordered: Current medicines are reviewed at length with the patient today.  Concerns regarding medicines are outlined above.  Medication changes, Labs and Tests ordered today are listed in the Patient Instructions below. Patient Instructions  Medication Instructions:  Your physician has recommended you make the following change in your medication:  Decrease Lisinopril to 20 mg Daily    Labwork: NONE   Testing/Procedures:  NONE   Follow-Up: Your  physician recommends that you schedule a follow-up appointment in: 1 Month with Jacolyn ReedyMichele Lenze, PA-C.  Your physician recommends that you schedule a follow-up appointment in: 4 Months with Dr. Diona BrownerMcDowell.   Blood Pressure check on a day you are in cardiac rehab.  Any Other Special Instructions Will Be Listed Below (If Applicable).  Stop drinking Tea    If you need a refill on your cardiac medications before your next appointment, please call your pharmacy. Thank you for choosing [facility]!       Elson ClanSigned, Michele Lenze, PA-C  04/04/2018 12:07 PM    [facility] [address], [place], [place]  [address] Phone: [phone]; Fax: [phone]

## 2018-04-04 ENCOUNTER — Ambulatory Visit: Payer: PPO | Admitting: Physician Assistant

## 2018-04-04 ENCOUNTER — Encounter: Payer: Self-pay | Admitting: Physician Assistant

## 2018-04-04 VITALS — BP 110/75 | HR 76 | Ht 62.0 in | Wt 222.6 lb

## 2018-04-04 DIAGNOSIS — I951 Orthostatic hypotension: Secondary | ICD-10-CM

## 2018-04-04 DIAGNOSIS — E785 Hyperlipidemia, unspecified: Secondary | ICD-10-CM | POA: Diagnosis not present

## 2018-04-04 DIAGNOSIS — I25709 Atherosclerosis of coronary artery bypass graft(s), unspecified, with unspecified angina pectoris: Secondary | ICD-10-CM | POA: Diagnosis not present

## 2018-04-04 MED ORDER — LISINOPRIL 20 MG PO TABS: 20.0000 mg | ORAL_TABLET | Freq: Every day | ORAL | 3 refills | Status: DC

## 2018-04-04 NOTE — Patient Instructions (Signed)
Medication Instructions:  Your physician has recommended you make the following change in your medication:  Decrease Lisinopril to 20 mg Daily    Labwork: NONE   Testing/Procedures:  NONE   Follow-Up: Your physician recommends that you schedule a follow-up appointment in: 1 Month with Jacolyn ReedyMichele Lenze, PA-C.  Your physician recommends that you schedule a follow-up appointment in: 4 Months with Dr. Diona BrownerMcDowell.   Blood Pressure check on a day you are in cardiac rehab.  Any Other Special Instructions Will Be Listed Below (If Applicable).  Stop drinking Tea    If you need a refill on your cardiac medications before your next appointment, please call your pharmacy. Thank you for choosing [facility]!

## 2018-04-06 ENCOUNTER — Ambulatory Visit (INDEPENDENT_AMBULATORY_CARE_PROVIDER_SITE_OTHER): Payer: PPO | Admitting: *Deleted

## 2018-04-06 VITALS — BP 115/77 | Ht 62.0 in | Wt 224.0 lb

## 2018-04-06 DIAGNOSIS — I951 Orthostatic hypotension: Secondary | ICD-10-CM | POA: Diagnosis not present

## 2018-04-06 NOTE — Progress Notes (Signed)
Pt states that she is feeling lightheaded during orthostatic vitals. No other complaints

## 2018-04-11 ENCOUNTER — Telehealth: Payer: Self-pay | Admitting: Family Medicine

## 2018-04-11 NOTE — Telephone Encounter (Signed)
Prescription faxed to pharmacy. Patient notified. 

## 2018-04-11 NOTE — Telephone Encounter (Signed)
Patient states she has reached a point where she gets too weak to stand for a shower and needs to be able to sit for a shower

## 2018-04-11 NOTE — Telephone Encounter (Signed)
Ok but carefully document in the message the reasons why pt needs

## 2018-04-11 NOTE — Telephone Encounter (Signed)
Patient is requesting a prescription for a shower chair to be called into [facility] last seen 10/27/17

## 2018-04-26 ENCOUNTER — Telehealth: Payer: Self-pay | Admitting: Family Medicine

## 2018-04-26 NOTE — Telephone Encounter (Signed)
Patient notified and verbalized understanding. 

## 2018-04-26 NOTE — Telephone Encounter (Signed)
Shower chair prescription was sent to [facility] but they don't handle things like that.  They told her to go to [facility] in [place].  She came by the office to pick up the written prescription but it has been shredded already.  Needs a new one sent to [facility].

## 2018-04-26 NOTE — Telephone Encounter (Signed)
Script written and faxed. Left message to return call to let pt know.

## 2018-04-27 ENCOUNTER — Other Ambulatory Visit: Payer: Self-pay | Admitting: Family Medicine

## 2018-04-29 ENCOUNTER — Encounter: Payer: Self-pay | Admitting: Family Medicine

## 2018-04-29 ENCOUNTER — Ambulatory Visit (INDEPENDENT_AMBULATORY_CARE_PROVIDER_SITE_OTHER): Payer: PPO | Admitting: Family Medicine

## 2018-04-29 ENCOUNTER — Ambulatory Visit: Payer: PPO | Admitting: Nurse Practitioner

## 2018-04-29 ENCOUNTER — Ambulatory Visit: Payer: PPO | Admitting: Family Medicine

## 2018-04-29 VITALS — BP 92/70 | Ht 62.0 in | Wt 224.0 lb

## 2018-04-29 DIAGNOSIS — I951 Orthostatic hypotension: Secondary | ICD-10-CM

## 2018-04-29 DIAGNOSIS — I10 Essential (primary) hypertension: Secondary | ICD-10-CM | POA: Diagnosis not present

## 2018-04-29 DIAGNOSIS — Z23 Encounter for immunization: Secondary | ICD-10-CM | POA: Diagnosis not present

## 2018-04-29 DIAGNOSIS — Z1159 Encounter for screening for other viral diseases: Secondary | ICD-10-CM

## 2018-04-29 DIAGNOSIS — E039 Hypothyroidism, unspecified: Secondary | ICD-10-CM | POA: Diagnosis not present

## 2018-04-29 DIAGNOSIS — F324 Major depressive disorder, single episode, in partial remission: Secondary | ICD-10-CM

## 2018-04-29 DIAGNOSIS — E785 Hyperlipidemia, unspecified: Secondary | ICD-10-CM

## 2018-04-29 NOTE — Progress Notes (Signed)
Subjective:    Patient ID: Lacey Bailey, female    DOB: [birth year], 72 y.o.   MRN: [id number]  HPI  Patient is here today to discuss chronic health illness.She sees a cardiologist ( Dr. Diona Browner) and attends cardiac rehab two days per week.   She see a urologist Wilkie Aye) for incontinence.  Last visit a couple years ago.  Reports complete urinary incontinence.  Right now okay with using pads, not wanting further intervention at this time.  She eats healthy and gets exercise with cardiac rehab twice weekly. She is on Paxil for her depression and phq 9 given to her today at the office visit.Bp a little low when checked so I moved her to the chair for safety reasons.  Had cardiac rehab today, goes twice per week.  Feels like rehab is very helpful for stamina and decreasing fatigue with ADLs.  Sees cardiologist next week, manages BP meds. Having less dizzy spells since reducing BP meds.   Depression: Overall doing well. Still significant fatigue.  Sleep difficulties - using sleep as an excuse to get away occasionally.  Sleeps about on avg 9-10 hrs per night with the ibuprofen-diphenydramine medication at night. Does not feel overly groggy in the am. Variable sleep without medication. Denies SI.  Appetite is good, tries to choose healthy options.  Drinks sweet tea mostly and some water.   Occasionally wakes up coughing at night.  Denies shortness of breath or having to prop up on pillows to sleep.  No increased edema.  Asking about a humidifier.  Review of Systems  Constitutional: Positive for fatigue. Negative for appetite change, fever and unexpected weight change.  Respiratory: Negative for shortness of breath.   Cardiovascular: Negative for leg swelling.  Genitourinary:       Urinary incontinence   Neurological: Positive for light-headedness (Improved since decreasing BP meds 3 weeks ago).  Psychiatric/Behavioral: Negative for dysphoric mood and suicidal ideas.         Objective:   Physical Exam  Constitutional: She is oriented to person, place, and time. No distress.  HENT:  Head: Normocephalic and atraumatic.  Eyes: Right eye exhibits no discharge. Left eye exhibits no discharge.  Neck: Neck supple. No thyromegaly present.  Cardiovascular: Normal rate, regular rhythm and normal heart sounds.  Pulses:      Radial pulses are 2+ on the right side, and 2+ on the left side.  Pulmonary/Chest: Effort normal and breath sounds normal. No respiratory distress.  Lymphadenopathy:    She has no cervical adenopathy.  Neurological: She is alert and oriented to person, place, and time.  Skin: Skin is warm and dry.  Psychiatric: She has a normal mood and affect. Her behavior is normal.  Vitals reviewed.      Assessment & Plan:  1. Essential hypertension  BP low today in office.  Positive orthostatics.  Denies any light-headedness or dizziness at the time.  Decrease lisinopril to 10 mg daily and f/u with cardiology at appt next week. Blood pressure approximately 94/68 sitting 86/56 standing  2. Other depression  Mood is stable. Continue paxil. Discussed warning signs and when to seek help. Pt verbalized understanding.   3. Hyperlipidemia with target LDL less than 70  Lipid panel last checked in March 2019, under decent control. LDL under 70.  Continue crestor as prescribed.  4. Hypothyroidism, unspecified type - Plan: TSH   TSH ordered, awaiting results.  Continue current dose of levothyroxine.   Patient was diagnosed with major  depression doing better on medication stable partial remission continue medication for now  Encounter for hepatitis C screening test for low risk patient - Plan: Hepatitis C antibody ordered per CDC guidelines.  Need for vaccination - Plan: Flu Vaccine QUAD 36+ mos IM Flu vaccine given.  Encourage d/c'd use of ibuprofen pm medication.  May use diphenydramine if she feels it is necessary, but recommend prn use. Pt states she  will try without first and only use plain diphenhydramine if needed.  25 minutes was spent with the patient.  This statement verifies that 25 minutes was indeed spent with the patient.  More than 50% of this visit-total duration of the visit-was spent in counseling and coordination of care. The issues that the patient came in for today as reflected in the diagnosis (s) please refer to documentation for further details.  As attending physician to this patient visit, this patient was seen in conjunction with the nurse practitioner.  The history,physical and treatment plan was reviewed with the nurse practitioner and pertinent findings were verified with the patient.  Also the treatment plan was reviewed with the patient while they were present.   She will f/u with [place] in 6 months

## 2018-04-29 NOTE — Patient Instructions (Addendum)
Stop taking ibuprofen-diphenydramine at bedtime.  May take just plain diphenydramine (benadryl) as needed for sleep.  Decrease your Lisinopril to 10 mg once per day.  You may cut current 20 mg tablet in half.

## 2018-04-30 ENCOUNTER — Encounter: Payer: Self-pay | Admitting: Family Medicine

## 2018-04-30 LAB — HEPATITIS C ANTIBODY: Hep C Virus Ab: 0.2 s/co ratio (ref 0.0–0.9)

## 2018-04-30 LAB — TSH: TSH: 2.1 u[IU]/mL (ref 0.450–4.500)

## 2018-05-01 MED ORDER — PAROXETINE HCL 30 MG PO TABS: 30.0000 mg | ORAL_TABLET | Freq: Every day | ORAL | 5 refills | Status: DC

## 2018-05-04 NOTE — Progress Notes (Signed)
Cardiology Office Note    Date:  05/09/2018   ID:  [name], [name] 18-Jul-1946, MRN [id number]  PCP:  Merlyn Albert, MD  Cardiologist: Nona Dell, MD EPS: None  Chief Complaint  Patient presents with  . Follow-up    History of Present Illness:  Lacey Bailey is a 72 y.o. female with history of CAD status post CABG in 2003 with multiple interventions at outside hospital since then, DES to the PLB 11/2014, PTCA to the mid RCA and mid circumflex 10/2015 with patent LIMA to the LAD and occlusion of all SVG grafts.  Patient had worsening fatigue and underwent repeat cardiac catheterization 01/03/2018 that showed patent LIMA to the LAD, known occlusion of  SVGs and complete occlusion of previously placed stent in the circumflex and RCA.  Given the extensive work she has had done on her vessels she was not felt to be a candidate for CTO, PCI.  Dr. Maren Beach did not feel like she had any remaining surgical targets and recommend medical therapy.  She is doing maintenance program in cardiac rehab.  Also has hypertension, HLD, TIA.   Saw patient 04/04/2018 at which time she was having a lot of dizziness and low blood pressure.  She was only drinking coffee and sweet tea and she was orthostatic in the office.  I decreased her lisinopril to 20 mg daily.  She was still orthostatic 04/29/2018 and lisinopril decreased to 10 mg a day by Dr. Gerda Diss.  Patient comes in today for f/u. Dizziness has improved some but still dizzy when she changes position and fluctuates at cardiac rehab .   Past Medical History:  Diagnosis Date  . Anginal pain (HCC)   . Aortic sclerosis   . CAD (coronary artery disease)    a. s/p CABG in 2003 b. multiple interventions since at outside hospitals c. DES to PLB in 11/2014 d. angioplasty to mid-RCA and mid-LCx in 10/2015 with patent LIMA-LAD and occlusion of all vein grafts e. 12/2017: occlusion of vein grafts and native LCx and RCA with patent LIMA-LAD. Re-do CABG NOT  recommended by CT surgery  . Depression   . Essential hypertension   . Hyperlipidemia   . Hypothyroidism   . Kidney stones   . TIA (transient ischemic attack) ~ 2013    Past Surgical History:  Procedure Laterality Date  . ABDOMINAL HYSTERECTOMY    . APPENDECTOMY    . BLEPHAROPLASTY Bilateral    "uppers"  . CARDIAC CATHETERIZATION  11/22/2015  . CARDIAC CATHETERIZATION N/A 11/22/2015   Procedure: Left Heart Cath and Cors/Grafts Angiography;  Surgeon: Marykay Lex, MD;  Location: [facility] INVASIVE CV LAB;  Service: Cardiovascular;  Laterality: N/A;  . CARDIAC CATHETERIZATION N/A 11/22/2015   Procedure: Coronary Stent Intervention;  Surgeon: Marykay Lex, MD;  Location: [facility] INVASIVE CV LAB;  Service: Cardiovascular;  Laterality: N/A;  . CATARACT EXTRACTION, BILATERAL Bilateral   . CESAREAN SECTION  1977  . CORONARY ANGIOPLASTY WITH STENT PLACEMENT  "several"   "last one was 11/2014:  DES to the posterolateral branch /notes 11/20/2015  . CORONARY ARTERY BYPASS GRAFT  2003   [place]; CABG X 4  . DILATION AND CURETTAGE OF UTERUS    . KIDNEY STONE SURGERY     "opened me up"  . LAPAROSCOPIC CHOLECYSTECTOMY    . LEFT HEART CATH AND CORS/GRAFTS ANGIOGRAPHY N/A 01/03/2018   Procedure: LEFT HEART CATH AND CORS/GRAFTS ANGIOGRAPHY;  Surgeon: [name], Peter M, MD;  Location: [facility] INVASIVE CV LAB;  Service: Cardiovascular;  Laterality: N/A;  . TONSILLECTOMY    . TUBAL LIGATION      Current Medications: Current Meds  Medication Sig  . albuterol (PROVENTIL HFA;VENTOLIN HFA) 108 (90 Base) MCG/ACT inhaler Inhale 2 puffs into the lungs every 6 (six) hours as needed for wheezing or shortness of breath.  Marland Kitchen amLODipine (NORVASC) 5 MG tablet TAKE ONE TABLET BY MOUTH EVERY DAY  . aspirin 81 MG tablet Take 81 mg by mouth daily.  Marland Kitchen gabapentin (NEURONTIN) 100 MG capsule Take one po TID (Patient taking differently: Take 100 mg by mouth 3 (three) times daily as needed (for headache). )  . isosorbide mononitrate  (IMDUR) 30 MG 24 hr tablet TAKE ONE TABLET BY MOUTH DAILY  . levothyroxine (SYNTHROID, LEVOTHROID) 75 MCG tablet TAKE ONE TABLET BY MOUTH DAILY BEFORE BREAKFAST  . lisinopril (PRINIVIL,ZESTRIL) 20 MG tablet Take 1 tablet (20 mg total) by mouth daily.  . metoprolol succinate (TOPROL-XL) 25 MG 24 hr tablet TAKE 1/2 TABLET BY MOUTH EVERY EVENING  . nitroGLYCERIN (NITROSTAT) 0.4 MG SL tablet PLACE 1 TABLET UNDER TONGUE EVERY 5 MINUTES AS NEEDED FOR CHEST PAIN. IF NO RELIEF AFTER THE 3RD DOSE, PROCEED TO THE ER FOR AN EVALUATION  . pantoprazole (PROTONIX) 40 MG tablet TAKE ONE TABLET BY MOUTH EVERY DAY  . PARoxetine (PAXIL) 30 MG tablet Take 1 tablet (30 mg total) by mouth daily.  . rosuvastatin (CRESTOR) 20 MG tablet TAKE ONE TABLET BY MOUTH EVERY DAY FOR cholesterol     Allergies:   Patient has no known allergies.   Social History   Socioeconomic History  . Marital status: Married    Spouse name: Not on file  . Number of children: Not on file  . Years of education: Not on file  . Highest education level: Not on file  Occupational History  . Not on file  Social Needs  . Financial resource strain: Not on file  . Food insecurity:    Worry: Not on file    Inability: Not on file  . Transportation needs:    Medical: Not on file    Non-medical: Not on file  Tobacco Use  . Smoking status: Never Smoker  . Smokeless tobacco: Never Used  Substance and Sexual Activity  . Alcohol use: Yes    Alcohol/week: 0.0 standard drinks    Comment: 11/22/2015 "nothing since ~ 2005; occasionally had a drink w/dinner before 2005"  . Drug use: No  . Sexual activity: Not Currently  Lifestyle  . Physical activity:    Days per week: Not on file    Minutes per session: Not on file  . Stress: Not on file  Relationships  . Social connections:    Talks on phone: Not on file    Gets together: Not on file    Attends religious service: Not on file    Active member of club or organization: Not on file     Attends meetings of clubs or organizations: Not on file    Relationship status: Not on file  Other Topics Concern  . Not on file  Social History Narrative  . Not on file     Family History:  The patient's family history includes Heart attack in her mother; Heart disease in her brother and mother.   ROS:   Please see the history of present illness.    Review of Systems  Constitution: Negative.  HENT: Negative.   Eyes: Negative.   Cardiovascular: Negative.   Respiratory: Negative.  Hematologic/Lymphatic: Negative.   Musculoskeletal: Negative.  Negative for joint pain.  Gastrointestinal: Negative.   Genitourinary: Negative.   Neurological: Positive for dizziness and weakness.   All other systems reviewed and are negative.   PHYSICAL EXAM:   VS:  BP 126/68 (BP Location: Right Arm)   Pulse 68   Ht 5\' 2"  (1.575 m)   Wt 222 lb (100.7 kg)   SpO2 96%   BMI 40.60 kg/m   Physical Exam  GEN: Well nourished, well developed, in no acute distress  HEENT: normal  Neck: no JVD, carotid bruits, or masses Cardiac:RRR; no murmurs, rubs, or gallops  Respiratory:  clear to auscultation bilaterally, normal work of breathing GI: soft, nontender, nondistended, + BS Ext: without cyanosis, clubbing, or edema, Good distal pulses bilaterally MS: no deformity or atrophy  Skin: warm and dry, no rash Neuro:  Alert and Oriented x 3, Strength and sensation are intact Psych: euthymic mood, full affect  Wt Readings from Last 3 Encounters:  05/09/18 222 lb (100.7 kg)  04/29/18 224 lb 0.4 oz (101.6 kg)  04/06/18 224 lb (101.6 kg)      Studies/Labs Reviewed:   EKG:  EKG is not ordered today.    Recent Labs: 11/03/2017: ALT 13 01/03/2018: BUN 13; Creatinine, Ser 1.33; Hemoglobin 12.4; Platelets 328; Potassium 4.8; Sodium 139 04/29/2018: TSH 2.100   Lipid Panel    Component Value Date/Time   CHOL 141 11/03/2017 1043   TRIG 205 (H) 11/03/2017 1043   HDL 39 (L) 11/03/2017 1043   CHOLHDL 3.6  11/03/2017 1043   CHOLHDL 6.0 04/20/2017 1100   VLDL 69 (H) 04/20/2017 1100   LDLCALC 61 11/03/2017 1043    Additional studies/ records that were reviewed today include:  Cardiac Catheterization: 01/03/2018  Post Atrio lesion is 20% stenosed.  Prox RCA to Mid RCA lesion is 100% stenosed.  Prox Cx to Dist Cx lesion is 100% stenosed.  Ost LAD to Prox LAD lesion is 100% stenosed.  LIMA graft was visualized by angiography and is large.  The graft exhibits no disease.  SVG graft was visualized by angiography.  SVG graft was visualized by angiography.  SVG graft was visualized by angiography.  Origin lesion is 100% stenosed.  Origin to Prox Graft lesion is 100% stenosed.  Origin to Prox Graft lesion is 100% stenosed.  The left ventricular systolic function is normal.  LV end diastolic pressure is normal.  The left ventricular ejection fraction is 55-65% by visual estimate.   1. Severe 3 vessel occlusive CAD 2. Patent LIMA to the LAD 3. Occluded SVG to diagonal 4. Occluded SVG to the OM2 5. Occluded SVG to the PDA 6. Good LV function 7. Normal LVEDP   Plan: Compared to prior cardiac cath in March 2017 the stents in the mid to distal LCx and mid RCA are completely occluded. Given extensive work done on these vessels previously she is not a candidate for CTO PCI. The only consideration would be whether she is a candidate for redo CABG. Her RCA appears graftable. Based on old films the OM2 may be graftable. I would obtain surgical consultation. Will stop Plavix. Continue other medical therapy.         ASSESSMENT:    1. Orthostatic hypotension   2. Essential hypertension   3. Atherosclerosis of coronary artery bypass graft with angina pectoris, unspecified whether native or transplanted heart (HCC)   4. Hyperlipidemia with target LDL less than 70      PLAN:  In  order of problems listed above:   Orthostatic hypotension-patient continues to drop her blood  pressure with change in position.  Will stop amlodipine.  She will come in next Monday for orthostatics and we will adjust further if needed.  She is trying to stay more hydrated and is cut out sugar and her sweet teas  Essential hypertension patient has been hypotensive and we have been decreasing her medications  CAD status post CABG in 2003 with multiple interventions since then.  Cath 01/03/2018 patent LIMA to the LAD with all SVG grafts occluded and occlusion of stent in the circumflex and RCA.  Not felt to be a candidate for PCI and no good targets for redo CABG.  Medical therapy recommended.  Currently in cardiac rehab.  Hyperlipidemia continue Crestor LDL at goal in 10/2015.   Medication Adjustments/Labs and Tests Ordered: Current medicines are reviewed at length with the patient today.  Concerns regarding medicines are outlined above.  Medication changes, Labs and Tests ordered today are listed in the Patient Instructions below. Patient Instructions  Medication Instructions:  Stop amlodipine   Labwork: none  Testing/Procedures: none  Follow-Up: Your physician recommends that you schedule a follow-up appointment in: Monday with a nurse visit    Any Other Special Instructions Will Be Listed Below (If Applicable).     If you need a refill on your cardiac medications before your next appointment, please call your pharmacy.      Elson Clan, PA-C  05/09/2018 11:32 AM    [facility] [address] [place], [place], [place]  [address] Phone: [phone]; Fax: [phone]

## 2018-05-09 ENCOUNTER — Encounter: Payer: Self-pay | Admitting: Physician Assistant

## 2018-05-09 ENCOUNTER — Ambulatory Visit: Payer: PPO | Admitting: Physician Assistant

## 2018-05-09 VITALS — BP 126/68 | HR 68 | Ht 62.0 in | Wt 222.0 lb

## 2018-05-09 DIAGNOSIS — I951 Orthostatic hypotension: Secondary | ICD-10-CM | POA: Diagnosis not present

## 2018-05-09 DIAGNOSIS — I10 Essential (primary) hypertension: Secondary | ICD-10-CM

## 2018-05-09 DIAGNOSIS — I25709 Atherosclerosis of coronary artery bypass graft(s), unspecified, with unspecified angina pectoris: Secondary | ICD-10-CM | POA: Diagnosis not present

## 2018-05-09 DIAGNOSIS — E785 Hyperlipidemia, unspecified: Secondary | ICD-10-CM | POA: Diagnosis not present

## 2018-05-09 NOTE — Patient Instructions (Signed)
Medication Instructions:  Stop amlodipine   Labwork: none  Testing/Procedures: none  Follow-Up: Your physician recommends that you schedule a follow-up appointment in: Monday with a nurse visit    Any Other Special Instructions Will Be Listed Below (If Applicable).     If you need a refill on your cardiac medications before your next appointment, please call your pharmacy.

## 2018-05-16 ENCOUNTER — Ambulatory Visit: Payer: PPO | Admitting: *Deleted

## 2018-05-16 VITALS — BP 135/78 | HR 60 | Ht 62.0 in | Wt 224.0 lb

## 2018-05-16 DIAGNOSIS — I951 Orthostatic hypotension: Secondary | ICD-10-CM

## 2018-05-16 NOTE — Addendum Note (Signed)
Addended by: Kerney ElbePINNIX, Shrey Boike G on: 05/16/2018 03:44 PM   Modules accepted: Orders

## 2018-05-16 NOTE — Progress Notes (Signed)
Pt states that she was feeling fine upon arriving to office. When going from Lying to sitting and sitting to standing she became warm all over. She c/o being dizzy and " foggy". Denies SOB and chest pain at this time. Please advise.

## 2018-05-16 NOTE — Patient Instructions (Signed)
Medication Instructions:  Your physician has recommended you make the following change in your medication:  Stop Taking Imdur    Labwork: NONE   Testing/Procedures: NONE   Follow-Up: Your physician recommends that you schedule a follow-up appointment in: 1 Week with Blood Pressure Check    Any Other Special Instructions Will Be Listed Below (If Applicable).     If you need a refill on your cardiac medications before your next appointment, please call your pharmacy.  Thank you for choosing [facility]!

## 2018-05-23 ENCOUNTER — Ambulatory Visit (INDEPENDENT_AMBULATORY_CARE_PROVIDER_SITE_OTHER): Payer: PPO | Admitting: *Deleted

## 2018-05-23 DIAGNOSIS — R079 Chest pain, unspecified: Secondary | ICD-10-CM | POA: Diagnosis not present

## 2018-05-23 DIAGNOSIS — I951 Orthostatic hypotension: Secondary | ICD-10-CM

## 2018-05-23 MED ORDER — LISINOPRIL 5 MG PO TABS: 5.0000 mg | ORAL_TABLET | Freq: Every day | ORAL | 11 refills | Status: DC

## 2018-05-23 NOTE — Patient Instructions (Signed)
Medication Instructions:  Your physician has recommended you make the following change in your medication: Decrease Lisinopril to 5 mg daily   Labwork: None  Testing/Procedures: None   Follow-Up: Your physician recommends that you schedule a follow-up appointment in: Dr. Diona Browner  Any Other Special Instructions Will Be Listed Below (If Applicable).     If you need a refill on your cardiac medications before your next appointment, please call your pharmacy.

## 2018-05-23 NOTE — Progress Notes (Signed)
Pt states that has not been as dizzy lately. Pt does report feeling "foggy" when sitting up. Pt reports having headache for the past 2 days to the rt side of the head. Pt reports having hx of headaches. No other complaints at this time.

## 2018-05-23 NOTE — Progress Notes (Signed)
While nurse on the phone with pharmacy pt walked out the room and stated that she was having chest pain. Nurse ended call and walked in pt room. Pt asked to lay on exam table. Pt stated pain was 7/10.Pt denied SOB and nausea at that time. Pt not diaphoretic. EKG done and shown to [employer]. Pt then stated that the pain was starting to ease up and was now 3/10. After sitting in chair for a few mins pt reports that pain is now completely gone. No new orders given. Pt instructed to be seen in the ED if chest pain persist. Pt voiced understanding.

## 2018-05-27 ENCOUNTER — Telehealth: Payer: Self-pay | Admitting: Cardiology

## 2018-05-27 NOTE — Telephone Encounter (Signed)
Returned pt call. She stated that while @ cardiac rehab today her blood pressure reading was 154/80. When she got ho,e she took it again and it was 157/78. She is concerned because she is going out of town for 3 weeks to visit her grandchildren and is nervous due to these readings. She stated that she has been eating a lot of Healthy Choice meals and that she did not know the sodium was as high as it was. I did advise her to take her blood pressure cuff with her on her trip and call if she  continues to get high readings. She voiced understandings. Will forward to Jacolyn Reedy, PA as an [place].

## 2018-05-27 NOTE — Telephone Encounter (Signed)
Pt's BP reading at Cardiac Rehab was 154/80, it's never been that high.  She is going out of town for 3 weeks Monday and would like to hear from someone today.

## 2018-05-30 NOTE — Telephone Encounter (Signed)
Called pt. Non answer, left message for pt to return call.  

## 2018-05-30 NOTE — Telephone Encounter (Signed)
Can you call patient and ask how BP is once she cut the sodium out of her diet? We've stopped a lot of meds b/c of orthostatic hypotension. Thanks, Elon Jester

## 2018-06-01 NOTE — Telephone Encounter (Signed)
Called pt. No answer. Left message for pt to return call. I know she is out of town this week, she may not return call until she returns.

## 2018-06-09 ENCOUNTER — Telehealth: Payer: Self-pay | Admitting: Student

## 2018-06-09 DIAGNOSIS — Z79899 Other long term (current) drug therapy: Secondary | ICD-10-CM

## 2018-06-09 NOTE — Telephone Encounter (Signed)
Please call patient in regards to BP concerns/tg

## 2018-06-09 NOTE — Telephone Encounter (Signed)
Called pt. She apparently has bad cell service and I could not understand what she was saying. We then got disconnected. Tried to cal her back, no answer-

## 2018-06-20 NOTE — Telephone Encounter (Signed)
Pt went away for 3 weeks, states last week she felt bad, "fuzzy headed" no stamina, slept 6 hrs during day. Had not taken her BP while she was away. Yesterday BP was 167/90 so she increase her lisinopril to 10 mg.Today BP is 136/95 and she feels better.  States she is "paranoid about medicine"    Please advise

## 2018-06-20 NOTE — Telephone Encounter (Signed)
Patient states she her BP has been really high, she is dizzy, and she hasn't been sleeping well. She increased her Lisinopril to 10mg .  Patient would like to know if she could be seen or if she needs to do any testing.  Please advise.

## 2018-06-21 NOTE — Telephone Encounter (Signed)
   In looking back at her past office visits with Jacolyn Reedy, PA-C, she has been experiencing frequent episodes of orthostatic hypotension which has led to dose reduction and discontinuation of several medications. She is fine to increase her Lisinopril back to 10 mg daily but would continue to follow BP in the ambulatory setting over the next several weeks.  If she stays on the increased dosing, would recommend checking a BMET in 2 weeks to assess kidney function and electrolytes.   Signed, Ellsworth Lennox, PA-C 06/21/2018, 7:20 AM Pager: [phone]

## 2018-06-21 NOTE — Telephone Encounter (Signed)
Pt notified of the need to have labs done in two weeks. Pt voiced understanding.

## 2018-06-27 ENCOUNTER — Other Ambulatory Visit: Payer: Self-pay | Admitting: Cardiology

## 2018-07-04 ENCOUNTER — Other Ambulatory Visit (HOSPITAL_COMMUNITY)
Admission: RE | Admit: 2018-07-04 | Discharge: 2018-07-04 | Disposition: A | Discharge status: Home / Self Care | Payer: PPO | Source: Ambulatory Visit | Attending: Student | Admitting: Student

## 2018-07-04 DIAGNOSIS — Z79899 Other long term (current) drug therapy: Secondary | ICD-10-CM | POA: Diagnosis not present

## 2018-07-04 LAB — BASIC METABOLIC PANEL
ANION GAP: 9 (ref 5–15)
BUN: 18 mg/dL (ref 8–23)
CO2: 25 mmol/L (ref 22–32)
Calcium: 9.5 mg/dL (ref 8.9–10.3)
Chloride: 107 mmol/L (ref 98–111)
Creatinine, Ser: 1.15 mg/dL — ABNORMAL HIGH (ref 0.44–1.00)
GFR calc Af Amer: 53 mL/min — ABNORMAL LOW (ref >60)
GFR calc non Af Amer: 46 mL/min — ABNORMAL LOW (ref >60)
GLUCOSE: 105 mg/dL — AB (ref 70–99)
POTASSIUM: 4.3 mmol/L (ref 3.5–5.1)
Sodium: 141 mmol/L (ref 135–145)

## 2018-07-05 ENCOUNTER — Telehealth: Payer: Self-pay | Admitting: *Deleted

## 2018-07-05 NOTE — Telephone Encounter (Signed)
-----   Message from Ellsworth LennoxBrittany M Strader, [place] JerseyPA-C sent at 07/05/2018  7:20 AM EST ----- Please let the patient know that her electrolytes and kidney function remain stable. Continue current medication regimen.  Please forward a copy of labs to Merlyn AlbertLuking, William S, MD. Thank you.

## 2018-07-05 NOTE — Telephone Encounter (Signed)
Called patient with test results. No answer. Left message to call back.  

## 2018-08-03 NOTE — Progress Notes (Signed)
Cardiology Office Note  Date: 08/04/2018   ID: Lacey, Bailey 03-20-1946, MRN [id number]  PCP: Merlyn Albert, MD  Primary Cardiologist: Nona Dell, MD   Chief Complaint  Patient presents with  . Coronary Artery Disease    History of Present Illness: Lacey Bailey is a 72 y.o. female last seen by Ms. Lilla Shook in September.  She is here for a routine visit.  She tells me that she has been taking her medications regularly and has had only intermittent angina symptoms.  She seems to have come to better terms with her cardiac disease and is enjoying cardiac rehabilitation so far.  I reviewed her current medications which are outlined below.  She reports no intolerances.  I also went over her home blood pressure checks, systolics have been well controlled.  We discussed continuing her current regimen.  Continues to see Dr. Gerda Diss for primary care.  Lab work from earlier in the year showed LDL 61. Most recent cardiac catheterization with plan for medical therapy is outlined below.  Past Medical History:  Diagnosis Date  . Anginal pain (HCC)   . Aortic sclerosis   . CAD (coronary artery disease)    a. s/p CABG in 2003 b. multiple interventions since at outside hospitals c. DES to PLB in 11/2014 d. angioplasty to mid-RCA and mid-LCx in 10/2015 with patent LIMA-LAD and occlusion of all vein grafts e. 12/2017: occlusion of vein grafts and native LCx and RCA with patent LIMA-LAD. Re-do CABG NOT recommended by CT surgery  . Depression   . Essential hypertension   . Hyperlipidemia   . Hypothyroidism   . Kidney stones   . TIA (transient ischemic attack) ~ 2013    Past Surgical History:  Procedure Laterality Date  . ABDOMINAL HYSTERECTOMY    . APPENDECTOMY    . BLEPHAROPLASTY Bilateral    "uppers"  . CARDIAC CATHETERIZATION  11/22/2015  . CARDIAC CATHETERIZATION N/A 11/22/2015   Procedure: Left Heart Cath and Cors/Grafts Angiography;  Surgeon: Marykay Lex, MD;   Location[facility] INVAS[facility] CV LAB;  Service: Cardiovascular;  Laterality: N/A;  . CARDIAC CATHETERIZATION N/A 11/22/2015   Procedure: Coronary Stent Intervention;  Surgeon: Marykay Lex, MD;  Location: [facility] INVASIVE CV LAB;  Service: Cardiovascular;  Laterality: N/A;  . CATARACT EXTRACTION, BILATERAL Bilateral   . CESAREAN SECTION  1977  . CORONARY ANGIOPLASTY WITH STENT PLACEMENT  "several"   "last one was 11/2014:  DES to the posterolateral branch /notes 11/20/2015  . CORONARY ARTERY BYPASS GRAFT  2003   [place]; CABG X 4  . DILATION AND CURETTAGE OF UTERUS    . KIDNEY STONE SURGERY     "opened me up"  . LAPAROSCOPIC CHOLECYSTECTOMY    . LEFT HEART CATH AND CORS/GRAFTS ANGIOGRAPHY N/A 01/03/2018   Procedure: LEFT HEART CATH AND CORS/GRAFTS ANGIOGRAPHY;  Surgeon: [name], Peter M, MD;  Location: [facility] INVASIVE CV LAB;  Service: Cardiovascular;  Laterality: N/A;  . TONSILLECTOMY    . TUBAL LIGATION      Current Outpatient Medications  Medication Sig Dispense Refill  . albuterol (PROVENTIL HFA;VENTOLIN HFA) 108 (90 Base) MCG/ACT inhaler Inhale 2 puffs into the lungs every 6 (six) hours as needed for wheezing or shortness of breath. 1 Inhaler 0  . aspirin 81 MG tablet Take 81 mg by mouth daily.    Marland Kitchen gabapentin (NEURONTIN) 100 MG capsule Take one po TID (Patient taking differently: Take 100 mg by mouth 3 (three) times daily as  needed (for headache). ) 90 capsule 5  . levothyroxine (SYNTHROID, LEVOTHROID) 75 MCG tablet TAKE ONE TABLET BY MOUTH DAILY BEFORE BREAKFAST 90 tablet 1  . lisinopril (PRINIVIL,ZESTRIL) 5 MG tablet Take 1 tablet (5 mg total) by mouth daily. 30 tablet 11  . metoprolol succinate (TOPROL-XL) 25 MG 24 hr tablet TAKE 1/2 TABLET BY MOUTH EVERY EVENING 15 tablet 4  . nitroGLYCERIN (NITROSTAT) 0.4 MG SL tablet PLACE 1 TABLET UNDER TONGUE EVERY 5 MINUTES AS NEEDED FOR CHEST PAIN. IF NO RELIEF AFTER THE 3RD DOSE, PROCEED TO THE ER FOR AN EVALUATION 25 tablet 3  . pantoprazole (PROTONIX) 40  MG tablet TAKE ONE TABLET BY MOUTH EVERY DAY 90 tablet 3  . PARoxetine (PAXIL) 30 MG tablet Take 1 tablet (30 mg total) by mouth daily. 30 tablet 5  . rosuvastatin (CRESTOR) 20 MG tablet TAKE ONE TABLET BY MOUTH EVERY DAY FOR cholesterol 90 tablet 1   No current facility-administered medications for this visit.    Allergies:  Patient has no known allergies.   Social History: The patient  reports that she has never smoked. She has never used smokeless tobacco. She reports previous alcohol use. She reports that she does not use drugs.  ROS:  Please see the history of present illness. Otherwise, complete review of systems is positive for stable dyspnea on exertion.  All other systems are reviewed and negative.   Physical Exam: VS:  BP 106/68 (BP Location: Right Arm)   Pulse 79   Ht 5\' 2"  (1.575 m)   Wt 226 lb (102.5 kg)   SpO2 95%   BMI 41.34 kg/m , BMI Body mass index is 41.34 kg/m.  Wt Readings from Last 3 Encounters:  08/04/18 226 lb (102.5 kg)  05/16/18 224 lb (101.6 kg)  05/09/18 222 lb (100.7 kg)    General: Patient appears comfortable at rest. HEENT: Conjunctiva and lids normal, oropharynx clear. Neck: Supple, no elevated JVP or carotid bruits, no thyromegaly. Lungs: Clear to auscultation, nonlabored breathing at rest. Cardiac: Regular rate and rhythm, no S3 or significant systolic murmur, no pericardial rub. Abdomen: Soft, nontender, bowel sounds present. Extremities: No pitting edema, distal pulses 2+. Skin: Warm and dry. Musculoskeletal: No kyphosis. Neuropsychiatric: Alert and oriented x3, affect grossly appropriate.  ECG: I personally reviewed the tracing from 05/23/2018 which showed sinus rhythm with LVH and diffuse ST-T wave abnormalities.  Recent Labwork: 11/03/2017: ALT 13; AST 17 01/03/2018: Hemoglobin 12.4; Platelets 328 04/29/2018: TSH 2.100 07/04/2018: BUN 18; Creatinine, Ser 1.15; Potassium 4.3; Sodium 141     Component Value Date/Time   CHOL 141 11/03/2017  1043   TRIG 205 (H) 11/03/2017 1043   HDL 39 (L) 11/03/2017 1043   CHOLHDL 3.6 11/03/2017 1043   CHOLHDL 6.0 04/20/2017 1100   VLDL 69 (H) 04/20/2017 1100   LDLCALC 61 11/03/2017 1043    Other Studies Reviewed Today:  Cardiac catheterization 01/03/2018:  Post Atrio lesion is 20% stenosed.  Prox RCA to Mid RCA lesion is 100% stenosed.  Prox Cx to Dist Cx lesion is 100% stenosed.  Ost LAD to Prox LAD lesion is 100% stenosed.  LIMA graft was visualized by angiography and is large.  The graft exhibits no disease.  SVG graft was visualized by angiography.  SVG graft was visualized by angiography.  SVG graft was visualized by angiography.  Origin lesion is 100% stenosed.  Origin to Prox Graft lesion is 100% stenosed.  Origin to Prox Graft lesion is 100% stenosed.  The left ventricular  systolic function is normal.  LV end diastolic pressure is normal.  The left ventricular ejection fraction is 55-65% by visual estimate.   1. Severe 3 vessel occlusive CAD 2. Patent LIMA to the LAD 3. Occluded SVG to diagonal 4. Occluded SVG to the OM2 5. Occluded SVG to the PDA 6. Good LV function 7. Normal LVEDP  Plan: Compared to prior cardiac cath in March 2017 the stents in the mid to distal LCx and mid RCA are completely occluded. Given extensive work done on these vessels previously she is not a candidate for CTO PCI.  Echocardiogram 05/19/2017: Study Conclusions  - Left ventricle: The cavity size was normal. Wall thickness was   increased in a pattern of moderate LVH. Systolic function was   normal. The estimated ejection fraction was in the range of 55%   to 60%. Wall motion was normal; there were no regional wall   motion abnormalities. Doppler parameters are consistent with   abnormal left ventricular relaxation (grade 1 diastolic   dysfunction). There was no evidence of elevated ventricular   filling pressure by Doppler parameters. - Aortic valve: Mildly calcified  annulus. Trileaflet; mildly   thickened leaflets. Valve area (VTI): 2.36 cm^2. Valve area   (Vmax): 1.98 cm^2. Valve area (Vmean): 2.13 cm^2. - Atrial septum: No defect or patent foramen ovale was identified. - Technically adequate study.   Assessment and Plan:  1.  Multivessel CAD status post CABG with graft disease.  LIMA to LAD is patent however all vein grafts are occluded and she also has occlusion of stent sites in the mid to distal circumflex and mid RCA.  She has been evaluated for possible CTO PCI and felt not to be a good candidate, also not a good candidate for redo CABG.  Plan at this time is to continue with cardiac rehabilitation and medical therapy.  2.  Essential hypertension, blood pressure is well controlled today.  3.  Mixed hyperlipidemia.  She continues on Crestor with last LDL 61.  Current medicines were reviewed with the patient today.  Disposition: Follow-up in 3 months.  Signed, Jonelle Sidle, MD, [facility] 08/04/2018 10:19 AM    [facility] [address] [place], [place] [address] Phone: [phone]; Fax: [phone]

## 2018-08-04 ENCOUNTER — Ambulatory Visit: Payer: PPO | Admitting: Cardiology

## 2018-08-04 ENCOUNTER — Telehealth: Payer: Self-pay | Admitting: Cardiology

## 2018-08-04 ENCOUNTER — Encounter: Payer: Self-pay | Admitting: Cardiology

## 2018-08-04 VITALS — BP 106/68 | HR 79 | Ht 62.0 in | Wt 226.0 lb

## 2018-08-04 DIAGNOSIS — I10 Essential (primary) hypertension: Secondary | ICD-10-CM | POA: Diagnosis not present

## 2018-08-04 DIAGNOSIS — I25119 Atherosclerotic heart disease of native coronary artery with unspecified angina pectoris: Secondary | ICD-10-CM

## 2018-08-04 DIAGNOSIS — E782 Mixed hyperlipidemia: Secondary | ICD-10-CM

## 2018-08-04 NOTE — Telephone Encounter (Signed)
Pharmacy called to confirm pt takes lisinopril 5 mg daily

## 2018-08-04 NOTE — Patient Instructions (Signed)
Medication Instructions:  Your physician recommends that you continue on your current medications as directed. Please refer to the Current Medication list given to you today.  If you need a refill on your cardiac medications before your next appointment, please call your pharmacy.   Lab work: None If you have labs (blood work) drawn today and your tests are completely normal, you will receive your results only by: Marland Kitchen. MyChart Message (if you have MyChart) OR . A paper copy in the mail If you have any lab test that is abnormal or we need to change your treatment, we will call you to review the results.  Testing/Procedures: None  Follow-Up: At [facility], you and your health needs are our priority.  As part of our continuing mission to provide you with exceptional heart care, we have created designated Provider Care Teams.  These Care Teams include your primary Cardiologist (physician) and Advanced Practice Providers (APPs -  Physician Assistants and Nurse Practitioners) who all work together to provide you with the care you need, when you need it. You will need a follow up appointment in 3 months.  Please call our office 2 months in advance to schedule this appointment.  You may see Nona DellSamuel McDowell, MD or one of the following Advanced Practice Providers on your designated Care Team:   Randall AnBrittany Strader, PA-C [facility]([place] Office) . Jacolyn ReedyMichele Lenze, PA-C [facility]([place] Office)  Any Other Special Instructions Will Be Listed Below (If Applicable). None

## 2018-08-04 NOTE — Telephone Encounter (Signed)
[facility] call [facility] concerning pt's lisinopril (PRINIVIL,ZESTRIL) 5 MG tablet [[id number]][[id number]]   [phone]

## 2018-08-05 ENCOUNTER — Other Ambulatory Visit: Payer: Self-pay | Admitting: Cardiology

## 2018-08-12 ENCOUNTER — Other Ambulatory Visit: Payer: Self-pay | Admitting: Cardiology

## 2018-08-25 ENCOUNTER — Other Ambulatory Visit: Payer: Self-pay | Admitting: Family Medicine

## 2018-09-25 ENCOUNTER — Other Ambulatory Visit: Payer: Self-pay | Admitting: Family Medicine

## 2018-10-31 ENCOUNTER — Encounter: Payer: Self-pay | Admitting: Family Medicine

## 2018-10-31 ENCOUNTER — Ambulatory Visit (INDEPENDENT_AMBULATORY_CARE_PROVIDER_SITE_OTHER): Payer: PPO | Admitting: Family Medicine

## 2018-10-31 VITALS — BP 118/72 | Ht 62.0 in | Wt 230.0 lb

## 2018-10-31 DIAGNOSIS — Z79899 Other long term (current) drug therapy: Secondary | ICD-10-CM | POA: Diagnosis not present

## 2018-10-31 DIAGNOSIS — E785 Hyperlipidemia, unspecified: Secondary | ICD-10-CM | POA: Diagnosis not present

## 2018-10-31 DIAGNOSIS — I10 Essential (primary) hypertension: Secondary | ICD-10-CM

## 2018-10-31 DIAGNOSIS — R32 Unspecified urinary incontinence: Secondary | ICD-10-CM

## 2018-10-31 DIAGNOSIS — E039 Hypothyroidism, unspecified: Secondary | ICD-10-CM | POA: Diagnosis not present

## 2018-10-31 MED ORDER — PAROXETINE HCL 40 MG PO TABS: 40.0000 mg | ORAL_TABLET | Freq: Every day | ORAL | 5 refills | Status: DC

## 2018-10-31 NOTE — Progress Notes (Signed)
   Subjective:    Patient ID: ESA CADD, female    DOB: Dec 31, 1945, 73 y.o.   MRN: [id number] Patient arrives with numerous concerns Hyperlipidemia  This is a chronic problem. The current episode started more than 1 year ago. Treatments tried: crestor 20mg     Having horrible fatigue that is getting worse.   Left lower leg pain off and on for on month or so.   Urinary Incontinence.  Would like a referral back to DR. McKenzie.   Has some stool leakage when having gas sometimes.   Headaches. Shooting pain through right ear. Usually takes ibuprofen and it does help.    occas sgets shooting pain int he ear, sharp in nature  [facility] two days per week, seems hlp ful   Not eadvised ti eercuse whe not there  Gets down with feeloing tired at times, impacts her depression some  Feeling ike not doing a much these days due to fatigue and feeling down    Family mostly lives away,   Review of Systems No headache, no major weight loss or weight gain, no chest pain no back pain abdominal pain no change in bowel habits complete ROS otherwise negative     Objective:   Physical Exam Alert and oriented, vitals reviewed and stable, NAD ENT-TM's and ext canals WNL bilat via otoscopic exam Soft palate, tonsils and post pharynx WNL via oropharyngeal exam Neck-symmetric, no masses; thyroid nonpalpable and nontender Pulmonary-no tachypnea or accessory muscle use; Clear without wheezes via auscultation Card--no abnrml murmurs, rhythm reg and rate WNL Carotid pulses symmetric, without bruits        Assessment & Plan:  Impression 1 depression/anxiety.  Worsening.  Discussed.  Will increase Paxil to 40 mg daily rationale discussed  2.  Hypertension good control discussed maintain same meds  3.  Hyperlipidemia status uncertain will check blood work  4.  Fatigue likely related to 1  5.  Neuropathic headaches.  Ongoing.  Not enough to warrant referral  6.  Incontinence.   Worsening.  Discussed with patient referral back to urologist.  Greater than 50% of this 25 minute face to face visit was spent in counseling and discussion and coordination of care regarding the above diagnosis/diagnosies

## 2018-11-01 ENCOUNTER — Encounter: Payer: Self-pay | Admitting: Family Medicine

## 2018-11-02 NOTE — Progress Notes (Signed)
Cardiology Office Note    Date:  11/03/2018   ID:  Lacey, Bailey [birth year], MRN [id number]  PCP:  Merlyn Albert, MD  Cardiologist: Nona Dell, MD    Chief Complaint  Patient presents with  . Follow-up    3 month visit    History of Present Illness:    Lacey Bailey is a 73 y.o. female with past medical history of CAD (s/p CABG in 2003, multiple interventions since at outside hospitals and most recent DES to PLB in 11/2014, angioplasty to mid-RCA and mid-LCx in 10/2015 with patent LIMA-LAD and occlusion of all vein grafts, cath in 12/2017 showing patent LIMA-LAD with occlusion of all vein grafts and occlusion of previously placed LCx and RCA stents with collaterals present --> not a candidate for re-do CABG), HTN, HLD, and prior TIA who presents to the office today for 93-month follow-up.  She was last examined by Dr. Diona Browner in 07/2018 and reported intermittent anginal symptoms but this had lessened while participating in cardiac rehab. By review of her prior catheterization reports, she was not felt to be a good candidate for CTO and was also not a good candidate for redo CABG at the recommendations of CT Surgery due to poor targets and the risk of jeopardizing her mammary artery. Continued medical management was recommended and she was continued on ASA 81mg  daily, Imdur 30mg  daily, Lisinopril 5mg  daily, Toprol-XL 12.5mg  daily, and Crestor 20mg  daily.   In talking with the patient today, she reports overall doing well from a cardiac perspective since her last office visit. She is participating in the maintenance cardiac rehab program and says she enjoys the comfort this provides in allowing for her to exercise in a safe environment. She denies any recent chest pain or dyspnea on exertion when performing these activities. Says her breathing has improved over the past few months.She did have an episode of discomfort several weeks ago while sitting and took 1 SL NTG with  resolution of her symptoms.   Says that she does have fatigue during the early morning hours and sleeps until approximately 11:30 due to having difficulty going to sleep at night. Paxil was recently adjusted by her PCP and she is unsure if symptoms have changed during this timeframe.   Past Medical History:  Diagnosis Date  . Anginal pain (HCC)   . Aortic sclerosis   . CAD (coronary artery disease)    a. s/p CABG in 2003 b. multiple interventions since at outside hospitals c. DES to PLB in 11/2014 d. angioplasty to mid-RCA and mid-LCx in 10/2015 with patent LIMA-LAD and occlusion of all vein grafts e. 12/2017: occlusion of vein grafts and native LCx and RCA with patent LIMA-LAD. Re-do CABG NOT recommended by CT surgery  . Depression   . Essential hypertension   . Hyperlipidemia   . Hypothyroidism   . Kidney stones   . TIA (transient ischemic attack) ~ 2013    Past Surgical History:  Procedure Laterality Date  . ABDOMINAL HYSTERECTOMY    . APPENDECTOMY    . BLEPHAROPLASTY Bilateral    "uppers"  . CARDIAC CATHETERIZATION  11/22/2015  . CARDIAC CATHETERIZATION N/A 11/22/2015   Procedure: Left Heart Cath and Cors/Grafts Angiography;  Surgeon: Marykay Lex, MD;  Location: [facility] INVASIVE CV LAB;  Service: Cardiovascular;  Laterality: N/A;  . CARDIAC CATHETERIZATION N/A 11/22/2015   Procedure: Coronary Stent Intervention;  Surgeon: Marykay Lex, MD;  Location: [facility] INVASIVE CV LAB;  Service: Cardiovascular;  Laterality: N/A;  . CATARACT EXTRACTION, BILATERAL Bilateral   . CESAREAN SECTION  1977  . CORONARY ANGIOPLASTY WITH STENT PLACEMENT  "several"   "last one was 11/2014:  DES to the posterolateral branch /notes 11/20/2015  . CORONARY ARTERY BYPASS GRAFT  2003   [place]; CABG X 4  . DILATION AND CURETTAGE OF UTERUS    . KIDNEY STONE SURGERY     "opened me up"  . LAPAROSCOPIC CHOLECYSTECTOMY    . LEFT HEART CATH AND CORS/GRAFTS ANGIOGRAPHY N/A 01/03/2018   Procedure: LEFT HEART CATH  AND CORS/GRAFTS ANGIOGRAPHY;  Surgeon: [name], Peter M, MD;  Location: [facility] INVASIVE CV LAB;  Service: Cardiovascular;  Laterality: N/A;  . TONSILLECTOMY    . TUBAL LIGATION      Current Medications: Outpatient Medications Prior to Visit  Medication Sig Dispense Refill  . albuterol (PROVENTIL HFA;VENTOLIN HFA) 108 (90 Base) MCG/ACT inhaler Inhale 2 puffs into the lungs every 6 (six) hours as needed for wheezing or shortness of breath. 1 Inhaler 0  . aspirin 81 MG tablet Take 81 mg by mouth daily.    Marland Kitchen gabapentin (NEURONTIN) 100 MG capsule Take one po TID (Patient taking differently: Take 100 mg by mouth 3 (three) times daily as needed (for headache). ) 90 capsule 5  . isosorbide mononitrate (IMDUR) 30 MG 24 hr tablet Take 30 mg by mouth daily.  6  . levothyroxine (SYNTHROID, LEVOTHROID) 75 MCG tablet TAKE 1 TABLET BY MOUTH DAILY BEFORE BREAKFAST 90 tablet 1  . lisinopril (PRINIVIL,ZESTRIL) 5 MG tablet TAKE 1 TABLET BY MOUTH EVERY DAY 30 tablet 11  . metoprolol succinate (TOPROL-XL) 25 MG 24 hr tablet TAKE 1/2 TABLET BY MOUTH EVERY EVENING 15 tablet 4  . nitroGLYCERIN (NITROSTAT) 0.4 MG SL tablet PLACE 1 TABLET UNDER TONGUE EVERY 5 MINUTES AS NEEDED FOR CHEST PAIN. IF NO RELIEF AFTER THE 3RD DOSE, PROCEED TO THE ER FOR AN EVALUATION 25 tablet 3  . pantoprazole (PROTONIX) 40 MG tablet TAKE ONE TABLET BY MOUTH EVERY DAY 90 tablet 3  . PARoxetine (PAXIL) 40 MG tablet Take 1 tablet (40 mg total) by mouth daily. 30 tablet 5  . rosuvastatin (CRESTOR) 20 MG tablet TAKE 1 TABLET BY MOUTH EVERY DAY FOR CHOLESTEROL 90 tablet 1   No facility-administered medications prior to visit.      Allergies:   Patient has no known allergies.   Social History   Socioeconomic History  . Marital status: Married    Spouse name: Not on file  . Number of children: Not on file  . Years of education: Not on file  . Highest education level: Not on file  Occupational History  . Not on file  Social Needs  .  Financial resource strain: Not on file  . Food insecurity:    Worry: Not on file    Inability: Not on file  . Transportation needs:    Medical: Not on file    Non-medical: Not on file  Tobacco Use  . Smoking status: Never Smoker  . Smokeless tobacco: Never Used  Substance and Sexual Activity  . Alcohol use: Not Currently    Alcohol/week: 0.0 standard drinks    Comment: 11/22/2015 "nothing since ~ 2005; occasionally had a drink w/dinner before 2005"  . Drug use: No  . Sexual activity: Not Currently  Lifestyle  . Physical activity:    Days per week: Not on file    Minutes per session: Not on file  . Stress: Not on file  Relationships  . Social connections:    Talks on phone: Not on file    Gets together: Not on file    Attends religious service: Not on file    Active member of club or organization: Not on file    Attends meetings of clubs or organizations: Not on file    Relationship status: Not on file  Other Topics Concern  . Not on file  Social History Narrative  . Not on file     Family History:  The patient's family history includes Heart attack in her mother; Heart disease in her brother and mother.   Review of Systems:   Please see the history of present illness.     General:  No chills, fever, night sweats or weight changes.  Cardiovascular:  No dyspnea on exertion, edema, orthopnea, palpitations, paroxysmal nocturnal dyspnea. Positive for chest pain (now resolved).  Dermatological: No rash, lesions/masses Respiratory: No cough, dyspnea Urologic: No hematuria, dysuria Abdominal:   No nausea, vomiting, diarrhea, bright red blood per rectum, melena, or hematemesis Neurologic:  No visual changes, wkns, changes in mental status. All other systems reviewed and are otherwise negative except as noted above.   Physical Exam:    VS:  BP 120/70   Pulse 70   Ht 5' 2.5" (1.588 m)   Wt 228 lb (103.4 kg)   SpO2 97%   BMI 41.04 kg/m    General: Well developed, well  nourished Caucasian female appearing in no acute distress. Head: Normocephalic, atraumatic, sclera non-icteric, no xanthomas, nares are without discharge.  Neck: No carotid bruits. JVD not elevated.  Lungs: Respirations regular and unlabored, without wheezes or rales.  Heart: Regular rate and rhythm. No S3 or S4.  No murmur, no rubs, or gallops appreciated. Abdomen: Soft, non-tender, non-distended with normoactive bowel sounds. No hepatomegaly. No rebound/guarding. No obvious abdominal masses. Msk:  Strength and tone appear normal for age. No joint deformities or effusions. Extremities: No clubbing or cyanosis. No lower extremity edema.  Distal pedal pulses are 2+ bilaterally. Neuro: Alert and oriented X 3. Moves all extremities spontaneously. No focal deficits noted. Psych:  Responds to questions appropriately with a normal affect. Skin: No rashes or lesions noted  Wt Readings from Last 3 Encounters:  11/03/18 228 lb (103.4 kg)  10/31/18 230 lb (104.3 kg)  08/04/18 226 lb (102.5 kg)     Studies/Labs Reviewed:   EKG:  EKG is not ordered today.   Recent Labs: 01/03/2018: Hemoglobin 12.4; Platelets 328 04/29/2018: TSH 2.100 07/04/2018: BUN 18; Creatinine, Ser 1.15; Potassium 4.3; Sodium 141   Lipid Panel    Component Value Date/Time   CHOL 141 11/03/2017 1043   TRIG 205 (H) 11/03/2017 1043   HDL 39 (L) 11/03/2017 1043   CHOLHDL 3.6 11/03/2017 1043   CHOLHDL 6.0 04/20/2017 1100   VLDL 69 (H) 04/20/2017 1100   LDLCALC 61 11/03/2017 1043    Additional studies/ records that were reviewed today include:   Cardiac Catheterization: 12/2017  Post Atrio lesion is 20% stenosed.  Prox RCA to Mid RCA lesion is 100% stenosed.  Prox Cx to Dist Cx lesion is 100% stenosed.  Ost LAD to Prox LAD lesion is 100% stenosed.  LIMA graft was visualized by angiography and is large.  The graft exhibits no disease.  SVG graft was visualized by angiography.  SVG graft was visualized by  angiography.  SVG graft was visualized by angiography.  Origin lesion is 100% stenosed.  Origin to Prox Graft lesion is  100% stenosed.  Origin to Prox Graft lesion is 100% stenosed.  The left ventricular systolic function is normal.  LV end diastolic pressure is normal.  The left ventricular ejection fraction is 55-65% by visual estimate.   1. Severe 3 vessel occlusive CAD 2. Patent LIMA to the LAD 3. Occluded SVG to diagonal 4. Occluded SVG to the OM2 5. Occluded SVG to the PDA 6. Good LV function 7. Normal LVEDP  Plan: Compared to prior cardiac cath in March 2017 the stents in the mid to distal LCx and mid RCA are completely occluded. Given extensive work done on these vessels previously she is not a candidate for CTO PCI. The only consideration would be whether she is a candidate for redo CABG. Her RCA appears graftable. Based on old films the OM2 may be graftable. I would obtain surgical consultation. Will stop Plavix. Continue other medical therapy.   Assessment:    1. Coronary artery disease of native artery of native heart with stable angina pectoris (HCC)   2. Essential hypertension   3. Hyperlipidemia with target LDL less than 70      Plan:   In order of problems listed above:  1. CAD - she is s/p CABG in 2003 and recent catheterization in 12/2017 showed a patent LIMA-LAD with occlusion of all vein grafts and occlusion of previously placed LCx and RCA stents with collaterals present. She was not felt to be a good candidate for CTO and was also not a good candidate for redo CABG at the recommendations of CT Surgery due to poor targets and the risk of jeopardizing her mammary artery. - she has overall done well on medical therapy and only reports one episode of pain over the past several months. She does carry SL NTG with her regularly. Also continues to participate in maintenance cardiac rehab.  - continue current medication regimen with ASA 81mg  daily, Imdur 30mg   daily, Lisinopril 5mg  daily, Toprol-XL 12.5mg  daily, and Crestor 20mg  daily. Imdur could be further titrated if her episodes of pain become more frequent.   2. HTN - BP is well controlled at 120/70 during today's visit. Continue Imdur 30 mg daily, Lisinopril 5 mg daily, and Toprol-XL 12.5 mg daily.  3. HLD - FLP in 2019 showed total cholesterol 141, triglycerides 205, HDL 39, and LDL 61. At goal LDL of less than 70. She remains on Crestor 20 mg daily. Due for repeat labs with her PCP next week.   Medication Adjustments/Labs and Tests Ordered: Current medicines are reviewed at length with the patient today.  Concerns regarding medicines are outlined above.  Medication changes, Labs and Tests ordered today are listed in the Patient Instructions below. Patient Instructions  Medication Instructions:  Your physician recommends that you continue on your current medications as directed. Please refer to the Current Medication list given to you today.  If you need a refill on your cardiac medications before your next appointment, please call your pharmacy.   Lab work: NONE   If you have labs (blood work) drawn today and your tests are completely normal, you will receive your results only by: Marland Kitchen MyChart Message (if you have MyChart) OR . A paper copy in the mail If you have any lab test that is abnormal or we need to change your treatment, we will call you to review the results.  Testing/Procedures: NONE   Follow-Up: At [facility], you and your health needs are our priority.  As part of our continuing mission to  provide you with exceptional heart care, we have created designated Provider Care Teams.  These Care Teams include your primary Cardiologist (physician) and Advanced Practice Providers (APPs -  Physician Assistants and Nurse Practitioners) who all work together to provide you with the care you need, when you need it. You will need a follow up appointment in 6 months.  Please call our  office 2 months in advance to schedule this appointment.  You may see Nona Dell, MD or one of the following Advanced Practice Providers on your designated Care Team:   Randall An, PA-C [facility]) . Jacolyn Reedy, PA-C [facility] Office)  Any Other Special Instructions Will Be Listed Below (If Applicable). Thank you for choosing [facility]!     Signed, Ellsworth Lennox, PA-C  11/03/2018 4:45 PM    [facility] [address] [place], [place] [address] Phone: [phone] Fax: [phone]

## 2018-11-03 ENCOUNTER — Encounter: Payer: Self-pay | Admitting: Student

## 2018-11-03 ENCOUNTER — Other Ambulatory Visit: Payer: Self-pay

## 2018-11-03 ENCOUNTER — Ambulatory Visit: Payer: PPO | Admitting: Student

## 2018-11-03 VITALS — BP 120/70 | HR 70 | Ht 62.5 in | Wt 228.0 lb

## 2018-11-03 DIAGNOSIS — E785 Hyperlipidemia, unspecified: Secondary | ICD-10-CM

## 2018-11-03 DIAGNOSIS — I10 Essential (primary) hypertension: Secondary | ICD-10-CM | POA: Diagnosis not present

## 2018-11-03 DIAGNOSIS — I25118 Atherosclerotic heart disease of native coronary artery with other forms of angina pectoris: Secondary | ICD-10-CM

## 2018-11-03 NOTE — Patient Instructions (Signed)
Medication Instructions:  Your physician recommends that you continue on your current medications as directed. Please refer to the Current Medication list given to you today.  If you need a refill on your cardiac medications before your next appointment, please call your pharmacy.   Lab work: NONE   If you have labs (blood work) drawn today and your tests are completely normal, you will receive your results only by: . MyChart Message (if you have MyChart) OR . A paper copy in the mail If you have any lab test that is abnormal or we need to change your treatment, we will call you to review the results.  Testing/Procedures: NONE   Follow-Up: At [facility], you and your health needs are our priority.  As part of our continuing mission to provide you with exceptional heart care, we have created designated Provider Care Teams.  These Care Teams include your primary Cardiologist (physician) and Advanced Practice Providers (APPs -  Physician Assistants and Nurse Practitioners) who all work together to provide you with the care you need, when you need it. You will need a follow up appointment in 6 months.  Please call our office 2 months in advance to schedule this appointment.  You may see Samuel McDowell, MD or one of the following Advanced Practice Providers on your designated Care Team:   Brittany Strader, PA-C ([place] Office) . Michele Lenze, PA-C ( Office)  Any Other Special Instructions Will Be Listed Below (If Applicable). Thank you for choosing [facility]!     

## 2018-11-08 DIAGNOSIS — Z79899 Other long term (current) drug therapy: Secondary | ICD-10-CM | POA: Diagnosis not present

## 2018-11-08 DIAGNOSIS — E039 Hypothyroidism, unspecified: Secondary | ICD-10-CM | POA: Diagnosis not present

## 2018-11-08 DIAGNOSIS — E785 Hyperlipidemia, unspecified: Secondary | ICD-10-CM | POA: Diagnosis not present

## 2018-11-09 LAB — LIPID PANEL
CHOLESTEROL TOTAL: 139 mg/dL (ref 100–199)
Chol/HDL Ratio: 3.8 ratio (ref 0.0–4.4)
HDL: 37 mg/dL — ABNORMAL LOW (ref >39)
LDL Calculated: 63 mg/dL (ref 0–99)
TRIGLYCERIDES: 194 mg/dL — AB (ref 0–149)
VLDL Cholesterol Cal: 39 mg/dL (ref 5–40)

## 2018-11-09 LAB — CBC WITH DIFFERENTIAL/PLATELET
BASOS ABS: 0.1 10*3/uL (ref 0.0–0.2)
Basos: 1 %
EOS (ABSOLUTE): 0.4 10*3/uL (ref 0.0–0.4)
Eos: 5 %
HEMOGLOBIN: 13.4 g/dL (ref 11.1–15.9)
Hematocrit: 39.2 % (ref 34.0–46.6)
IMMATURE GRANS (ABS): 0 10*3/uL (ref 0.0–0.1)
Immature Granulocytes: 0 %
LYMPHS: 27 %
Lymphocytes Absolute: 2.3 10*3/uL (ref 0.7–3.1)
MCH: 30.5 pg (ref 26.6–33.0)
MCHC: 34.2 g/dL (ref 31.5–35.7)
MCV: 89 fL (ref 79–97)
MONOCYTES: 7 %
Monocytes Absolute: 0.6 10*3/uL (ref 0.1–0.9)
NEUTROS PCT: 60 %
Neutrophils Absolute: 5.2 10*3/uL (ref 1.4–7.0)
Platelets: 346 10*3/uL (ref 150–450)
RBC: 4.39 x10E6/uL (ref 3.77–5.28)
RDW: 14 % (ref 11.7–15.4)
WBC: 8.6 10*3/uL (ref 3.4–10.8)

## 2018-11-09 LAB — HEPATIC FUNCTION PANEL
ALT: 16 IU/L (ref 0–32)
AST: 22 IU/L (ref 0–40)
Albumin: 4.3 g/dL (ref 3.7–4.7)
Alkaline Phosphatase: 86 IU/L (ref 39–117)
Bilirubin Total: 0.8 mg/dL (ref 0.0–1.2)
Bilirubin, Direct: 0.16 mg/dL (ref 0.00–0.40)
Total Protein: 7.1 g/dL (ref 6.0–8.5)

## 2018-11-09 LAB — TSH: TSH: 3.84 u[IU]/mL (ref 0.450–4.500)

## 2018-11-13 ENCOUNTER — Encounter: Payer: Self-pay | Admitting: Family Medicine

## 2018-11-17 ENCOUNTER — Telehealth: Payer: Self-pay

## 2018-11-17 ENCOUNTER — Other Ambulatory Visit: Payer: Self-pay

## 2018-11-17 ENCOUNTER — Ambulatory Visit (INDEPENDENT_AMBULATORY_CARE_PROVIDER_SITE_OTHER): Payer: PPO | Admitting: Family Medicine

## 2018-11-17 NOTE — Telephone Encounter (Signed)
I called and left a message to r/c. 

## 2018-11-17 NOTE — Telephone Encounter (Signed)
Patient is aware we are check with Dr. Brett Canales she also wants to know if she can take some left over meclizine she has in her cabinet. I did inform the patient should she feel worse go to the Ed.Please advise.

## 2018-11-17 NOTE — Telephone Encounter (Signed)
It is hard to know if the dizziness is related to the Paxil or is a intermittent issue  It does not sound like a stroke   Certainly if the dizziness gets worse she may need to be seen here or ER but we want to try to avoid that because of the virus  please talk with patient Let them know that we will pass this message on to Dr. Brett Canales who will be back in the office in the morning.  Further instructions at that time

## 2018-11-17 NOTE — Telephone Encounter (Signed)
Patient calling today regarding feeling dizzy for the last two days. She states it is not constant, but when it happens she has to hold on to the wall.   She reports she has had Cardiac by pass in the past. Has some heaviness in her chest, but no more than is usual for her.  She also states she has had a frequency in urinating.  No shortness of breath,No cough,No fever, No headaches.  She states she was seen here on October 31, 2018 and her dose of Paxil was increased from 30 mg to 40 mg. She was wondering if this had something to with the dizziness.  Please advise.

## 2018-11-17 NOTE — Telephone Encounter (Signed)
No, inccreased paxil ago would not cause dizziness last two days.   Yes sounds like vertigo go aheafd an use the meclizine   If chest symtoms worsen call her card  Do not rrec intervening on urinary isues right now give it a few more days

## 2018-11-18 ENCOUNTER — Other Ambulatory Visit: Payer: Self-pay

## 2018-11-18 ENCOUNTER — Telehealth (INDEPENDENT_AMBULATORY_CARE_PROVIDER_SITE_OTHER): Payer: PPO

## 2018-11-18 DIAGNOSIS — R42 Dizziness and giddiness: Secondary | ICD-10-CM

## 2018-11-18 NOTE — Progress Notes (Unsigned)
Patient states she has heard from the urologist and she is aware of all. She is aware that we will be billing for a tele visit to her insurance.   No, inccreased paxil ago would not cause dizziness last two days.   Yes sounds like vertigo go aheafd an use the meclizine   If chest symtoms worsen call her card  Do not rrec intervening on urinary isues right now give it a few more days   Patient calling today regarding feeling dizzy for the last two days. She states it is not constant, but when it happens she has to hold on to the wall.   She reports she has had Cardiac by pass in the past. Has some heaviness in her chest, but no more than is usual for her.  She also states she has had a frequency in urinating.  No shortness of breath,No cough,No fever, No headaches.  She states she was seen here on October 31, 2018 and her dose of Paxil was increased from 30 mg to 40 mg. She was wondering if this had something to with the dizziness.   I spent 10 minutes on this call.Crystal CMA

## 2018-11-18 NOTE — Telephone Encounter (Signed)
Patient states she has heard from the urologist and she is aware of all. She is aware that we will be billing for a tele visit to her insurance.

## 2019-01-16 ENCOUNTER — Other Ambulatory Visit: Payer: Self-pay

## 2019-01-16 ENCOUNTER — Emergency Department (HOSPITAL_COMMUNITY): Payer: PPO

## 2019-01-16 ENCOUNTER — Encounter (HOSPITAL_COMMUNITY): Payer: Self-pay

## 2019-01-16 ENCOUNTER — Emergency Department (HOSPITAL_COMMUNITY)
Admission: EM | Admit: 2019-01-16 | Discharge: 2019-01-16 | Disposition: A | Discharge status: Home / Self Care | Payer: PPO | Attending: Emergency Medicine | Admitting: Emergency Medicine

## 2019-01-16 DIAGNOSIS — Z951 Presence of aortocoronary bypass graft: Secondary | ICD-10-CM | POA: Insufficient documentation

## 2019-01-16 DIAGNOSIS — M79662 Pain in left lower leg: Secondary | ICD-10-CM | POA: Diagnosis not present

## 2019-01-16 DIAGNOSIS — Z8673 Personal history of transient ischemic attack (TIA), and cerebral infarction without residual deficits: Secondary | ICD-10-CM | POA: Insufficient documentation

## 2019-01-16 DIAGNOSIS — M79605 Pain in left leg: Secondary | ICD-10-CM | POA: Diagnosis not present

## 2019-01-16 DIAGNOSIS — E039 Hypothyroidism, unspecified: Secondary | ICD-10-CM | POA: Diagnosis not present

## 2019-01-16 DIAGNOSIS — Z7982 Long term (current) use of aspirin: Secondary | ICD-10-CM | POA: Insufficient documentation

## 2019-01-16 DIAGNOSIS — I10 Essential (primary) hypertension: Secondary | ICD-10-CM | POA: Insufficient documentation

## 2019-01-16 DIAGNOSIS — M25562 Pain in left knee: Secondary | ICD-10-CM | POA: Diagnosis not present

## 2019-01-16 DIAGNOSIS — Z79899 Other long term (current) drug therapy: Secondary | ICD-10-CM | POA: Diagnosis not present

## 2019-01-16 DIAGNOSIS — I2510 Atherosclerotic heart disease of native coronary artery without angina pectoris: Secondary | ICD-10-CM | POA: Insufficient documentation

## 2019-01-16 MED ORDER — HYDROCODONE-ACETAMINOPHEN 5-325 MG PO TABS
1.0000 | ORAL_TABLET | Freq: Once | ORAL | Status: AC
  Administered 2019-01-16: 12:00:00 1 via ORAL
  Filled 2019-01-16: qty 1

## 2019-01-16 NOTE — ED Notes (Signed)
Patient transported to X-ray 

## 2019-01-16 NOTE — ED Provider Notes (Signed)
[facility] EMERGENCY DEPARTMENT Provider Note   CSN: [id number] Arrival date & time: 01/16/19  1006    History   Chief Complaint Chief Complaint  Patient presents with  . Leg Pain    HPI Lacey Bailey is a 73 y.o. female with CAD s/p CABG, HTN, hypothyroidism and TIA presenting to the ED with left leg pain. She states for the past few days she has been having a pain pain in her left lower extremity from her knee to her ankle.  States yesterday she was leaving her home to get something to eat and she fell down in her garage onto concrete.  She states that her leg gave out and she fell straight down, denies hitting her back or head.  She reports after her fall it was very difficult for her to get back up and bear any weight on her left leg.  He states the pain is throbbing and achy in nature and constant.  She is unable to lift her left leg.  She took an ibuprofen last night and was able to fall asleep however the pain is worse this morning which prompted her to come to the ED.  She denies any numbness, tingling, headaches, chest pain, shortness of breath, recent sickness or sick contacts.  At baseline she is able to ambulate without difficulty.     HPI  Past Medical History:  Diagnosis Date  . Anginal pain (HCC)   . Aortic sclerosis   . CAD (coronary artery disease)    a. s/p CABG in 2003 b. multiple interventions since at outside hospitals c. DES to PLB in 11/2014 d. angioplasty to mid-RCA and mid-LCx in 10/2015 with patent LIMA-LAD and occlusion of all vein grafts e. 12/2017: occlusion of vein grafts and native LCx and RCA with patent LIMA-LAD. Re-do CABG NOT recommended by CT surgery  . Depression   . Essential hypertension   . Hyperlipidemia   . Hypothyroidism   . Kidney stones   . TIA (transient ischemic attack) ~ 2013    Patient Active Problem List   Diagnosis Date Noted  . Hypotension 03/30/2018  . Asthma 06/14/2017  . Fatigue 12/11/2015  . Accelerating angina (HCC)  11/22/2015  . CAD S/P percutaneous coronary angioplasty 11/22/2015  . Depression 09/03/2015  . Hypothyroidism 09/03/2015  . Atherosclerosis of coronary artery bypass graft with angina pectoris (HCC) 09/03/2015  . Essential hypertension 09/03/2015  . Hyperlipidemia with target LDL less than 70 09/03/2015    Past Surgical History:  Procedure Laterality Date  . ABDOMINAL HYSTERECTOMY    . APPENDECTOMY    . BLEPHAROPLASTY Bilateral    "uppers"  . CARDIAC CATHETERIZATION  11/22/2015  . CARDIAC CATHETERIZATION N/A 11/22/2015   Procedure: Left Heart Cath and Cors/Grafts Angiography;  Surgeon: Marykay Lex, MD;  Location: [facility] INVASIVE CV LAB;  Service: Cardiovascular;  Laterality: N/A;  . CARDIAC CATHETERIZATION N/A 11/22/2015   Procedure: Coronary Stent Intervention;  Surgeon: Marykay Lex, MD;  Location: [facility] INVASIVE CV LAB;  Service: Cardiovascular;  Laterality: N/A;  . CATARACT EXTRACTION, BILATERAL Bilateral   . CESAREAN SECTION  1977  . CORONARY ANGIOPLASTY WITH STENT PLACEMENT  "several"   "last one was 11/2014:  DES to the posterolateral branch /notes 11/20/2015  . CORONARY ARTERY BYPASS GRAFT  2003   [place]; CABG X 4  . DILATION AND CURETTAGE OF UTERUS    . KIDNEY STONE SURGERY     "opened me up"  . LAPAROSCOPIC CHOLECYSTECTOMY    .  LEFT HEART CATH AND CORS/GRAFTS ANGIOGRAPHY N/A 01/03/2018   Procedure: LEFT HEART CATH AND CORS/GRAFTS ANGIOGRAPHY;  Surgeon: [name], Peter M, MD;  Location: [facility] INVASIVE CV LAB;  Service: Cardiovascular;  Laterality: N/A;  . TONSILLECTOMY    . TUBAL LIGATION       OB History   No obstetric history on file.      Home Medications    Prior to Admission medications   Medication Sig Start Date End Date Taking? Authorizing Provider  albuterol (PROVENTIL HFA;VENTOLIN HFA) 108 (90 Base) MCG/ACT inhaler Inhale 2 puffs into the lungs every 6 (six) hours as needed for wheezing or shortness of breath. 05/31/17   Jodelle Gross, NP  aspirin 81 MG  tablet Take 81 mg by mouth daily.    [provider]  gabapentin (NEURONTIN) 100 MG capsule Take one po TID Patient taking differently: Take 100 mg by mouth 3 (three) times daily as needed (for headache).  10/27/17   Campbell Riches, NP  isosorbide mononitrate (IMDUR) 30 MG 24 hr tablet Take 30 mg by mouth daily. 06/07/18   [provider]  levothyroxine (SYNTHROID, LEVOTHROID) 75 MCG tablet TAKE 1 TABLET BY MOUTH DAILY BEFORE BREAKFAST 08/25/18   Merlyn Albert, MD  lisinopril (PRINIVIL,ZESTRIL) 5 MG tablet TAKE 1 TABLET BY MOUTH EVERY DAY 08/05/18   Jonelle Sidle, MD  metoprolol succinate (TOPROL-XL) 25 MG 24 hr tablet TAKE 1/2 TABLET BY MOUTH EVERY EVENING 09/26/18   Merlyn Albert, MD  nitroGLYCERIN (NITROSTAT) 0.4 MG SL tablet PLACE 1 TABLET UNDER TONGUE EVERY 5 MINUTES AS NEEDED FOR CHEST PAIN. IF NO RELIEF AFTER THE 3RD DOSE, PROCEED TO THE ER FOR AN EVALUATION 08/12/18   Jonelle Sidle, MD  pantoprazole (PROTONIX) 40 MG tablet TAKE ONE TABLET BY MOUTH EVERY DAY 02/28/18   Jonelle Sidle, MD  PARoxetine (PAXIL) 40 MG tablet Take 1 tablet (40 mg total) by mouth daily. 10/31/18   Merlyn Albert, MD  rosuvastatin (CRESTOR) 20 MG tablet TAKE 1 TABLET BY MOUTH EVERY DAY FOR CHOLESTEROL 09/26/18   Merlyn Albert, MD    Family History Family History  Problem Relation Age of Onset  . Heart attack Mother   . Heart disease Mother   . Heart disease Brother     Social History Social History   Tobacco Use  . Smoking status: Never Smoker  . Smokeless tobacco: Never Used  Substance Use Topics  . Alcohol use: Not Currently    Alcohol/week: 0.0 standard drinks    Comment: 11/22/2015 "nothing since ~ 2005; occasionally had a drink w/dinner before 2005"  . Drug use: No     Allergies   Patient has no known allergies.   Review of Systems Review of Systems  Constitutional: Negative for activity change, chills, diaphoresis, fatigue and fever.  HENT: Negative  for congestion, rhinorrhea, sinus pressure, sinus pain, sneezing and sore throat.   Eyes: Negative for pain, redness and visual disturbance.  Respiratory: Negative for cough, chest tightness and shortness of breath.   Cardiovascular: Negative for chest pain, palpitations and leg swelling.  Gastrointestinal: Negative for abdominal pain, nausea and vomiting.  Genitourinary: Negative for difficulty urinating, dysuria, hematuria and urgency.  Musculoskeletal: Positive for myalgias. Negative for back pain and joint swelling.  Neurological: Positive for weakness. Negative for dizziness, light-headedness, numbness and headaches.     Physical Exam Updated Vital Signs BP 134/83 (BP Location: Left Arm)   Pulse 77   Temp 97.9 F (36.6  C) (Oral)   Resp 17   Ht 5\' 2"  (1.575 m)   Wt 99.8 kg   SpO2 98%   BMI 40.24 kg/m   Physical Exam Constitutional:      General: She is not in acute distress.    Appearance: Normal appearance. She is not ill-appearing.  Cardiovascular:     Rate and Rhythm: Normal rate and regular rhythm.     Pulses: Normal pulses.     Heart sounds: Normal heart sounds. No murmur. No friction rub. No gallop.   Pulmonary:     Effort: Pulmonary effort is normal. No respiratory distress.     Breath sounds: Normal breath sounds. No wheezing or rales.  Abdominal:     General: Abdomen is flat. Bowel sounds are normal. There is no distension.     Palpations: Abdomen is soft.     Tenderness: There is no abdominal tenderness.  Musculoskeletal:        General: Tenderness present. No swelling or deformity.     Right lower leg: No edema.     Left lower leg: No edema.     Comments: Strength decreased in LLE 3/5, right LLE 5/5  Skin:    General: Skin is warm and dry.  Neurological:     General: No focal deficit present.     Mental Status: She is alert and oriented to person, place, and time.     Cranial Nerves: No cranial nerve deficit.     Sensory: No sensory deficit.   Psychiatric:        Mood and Affect: Mood normal.        Behavior: Behavior normal.        Thought Content: Thought content normal.        Judgment: Judgment normal.      ED Treatments / Results  Labs (all labs ordered are listed, but only abnormal results are displayed) Labs Reviewed - No data to display  EKG None  Radiology Dg Tibia/fibula Left  Result Date: 01/16/2019 CLINICAL DATA:  Fall last night. Pain extending from the knee into the ankle. EXAM: LEFT TIBIA AND FIBULA - 2 VIEW COMPARISON:  None. FINDINGS: There is no evidence of acute fracture or dislocation. The joint spaces are preserved. There is an expansile lesion in the distal 3rd of the left tibial diaphysis which is well-marginated with sclerotic margins and central gland glass density, typical of fibrous dysplasia. This demonstrates no aggressive characteristics. There are multiple vascular clips. Possible focal soft tissue swelling in the proximal calf on the lateral view. IMPRESSION: No acute osseous findings. Possible focal soft tissue swelling in the calf. Nonaggressive lesion in the tibial diaphysis, typical of fibrous dysplasia. Electronically Signed   By: Carey BullocksWilliam  Veazey M.D.   On: 01/16/2019 11:49   Dg Knee Complete 4 Views Left  Result Date: 01/16/2019 CLINICAL DATA:  Fall last night. Pain extending from the knee into the ankle. EXAM: LEFT KNEE - COMPLETE 4+ VIEW COMPARISON:  None. FINDINGS: The mineralization and alignment are normal. There is no evidence of acute fracture or dislocation. The joint spaces are preserved. There is no joint effusion. Vascular clips are noted posteromedially. IMPRESSION: Normal left knee radiographs. Electronically Signed   By: Carey BullocksWilliam  Veazey M.D.   On: 01/16/2019 11:50    Procedures Procedures (including critical care time)  Medications Ordered in ED Medications  HYDROcodone-acetaminophen (NORCO/VICODIN) 5-325 MG per tablet 1 tablet (1 tablet Oral Given 01/16/19 1150)      Initial Impression / Assessment  and Plan / ED Course  I have reviewed the triage vital signs and the nursing notes.  Pertinent labs & imaging results that were available during my care of the patient were reviewed by me and considered in my medical decision making (see chart for details).  Pt is a 73 yo presenting with left LE pain after sustaining a fall. She is having pain mostly from her knee down to her ankle, no obvious deformities, no decreased sensation, pulses intact. Strength is a 3/5 on left LE. No focal deficits and cranial nerves are intact. Will order left LE xrays.   Left tibia/fibula xray shows no acute findings, possible soft tissue swelling in calf, non-aggressive lesion in the tibial diaphysis typical of fibrous dysplasia. Normal left knee radiographs. Patient is stable for discharge home, will recommend she continue OTC pain medications and heating pad.  Final Clinical Impressions(s) / ED Diagnoses   Final diagnoses:  Left leg pain    ED Discharge Orders    None       Jaci Standard, DO 01/16/19 1424    Geoffery Lyons, MD 01/18/19 [phone]

## 2019-01-16 NOTE — Discharge Instructions (Signed)
I recommend you continue using over the counter pain medications for your left leg pain and you can continue using the heating pad if that helps. Follow up with your PCP in about one week.

## 2019-01-16 NOTE — ED Triage Notes (Signed)
Pt presents to ED with complaints of left leg pain from knee to ankle since last night. Pt states she fell last night after she felt like her leg "gave out" Pt denies LOC or hitting head.

## 2019-01-19 ENCOUNTER — Other Ambulatory Visit: Payer: Self-pay

## 2019-01-19 ENCOUNTER — Encounter: Payer: Self-pay | Admitting: Family Medicine

## 2019-01-19 ENCOUNTER — Ambulatory Visit (INDEPENDENT_AMBULATORY_CARE_PROVIDER_SITE_OTHER): Payer: PPO | Admitting: Family Medicine

## 2019-01-19 VITALS — BP 142/86 | Temp 97.4°F

## 2019-01-19 DIAGNOSIS — M7989 Other specified soft tissue disorders: Secondary | ICD-10-CM | POA: Diagnosis not present

## 2019-01-19 DIAGNOSIS — M79605 Pain in left leg: Secondary | ICD-10-CM | POA: Diagnosis not present

## 2019-01-19 DIAGNOSIS — M25562 Pain in left knee: Secondary | ICD-10-CM | POA: Diagnosis not present

## 2019-01-19 DIAGNOSIS — S83242A Other tear of medial meniscus, current injury, left knee, initial encounter: Secondary | ICD-10-CM

## 2019-01-19 MED ORDER — OXYCODONE-ACETAMINOPHEN 5-325 MG PO TABS: ORAL_TABLET | ORAL | 0 refills | Status: DC

## 2019-01-19 NOTE — Progress Notes (Signed)
   Subjective:    Patient ID: Lacey Bailey, female    DOB: 11-10-1945, 73 y.o.   MRN: [id number]  HPI Pt states on Monday she began to have pain in left leg. Pt went to walk out the door and she fell. Pt states she went to ER, took xray of knee. Xray came back negative. Er instructed patient to alternate tylenol and ibuprofen. From knee to ankle pt is having terrible pain. Unable to bear weight on left leg. Legs have been swollen but they have went down some. If pt bends leg, pt states that a pain will shoot up toward thigh area. Pt was unable to lift left leg Monday but she is able to do.  Pt here today with sister in law Lacey Bailey.  Complete emergency room report and ER doc report and imaging results reviewed  Ongoing severe pain.  Patient states unable to void in the pain.  Tearful in her description  States that her keeps giving way sometimes with pain and sometimes with  Review of Systems No headache, no major weight loss or weight gain, no chest pain no back pain abdominal pain no change in bowel habits complete ROS otherwise negative     Objective:   Physical Exam Obvious distress  Alert vitals stable, NAD. Blood pressure good on repeat. HEENT normal. Lungs clear. Heart regular rate and rhythm.    Positive medial joint line tenderness left leg.  Patient in a wheelchair.  Positive Apley's test on the left leg.  With rotation positive McMurray's test with extension and subsequent pain.       Assessment & Plan:  Impression probable medial meniscus tear.  Discussed at length.  Mechanism of injury discussed.  MRI of the knee necessary to make a diagnosis.  May add oxycodone as needed local measures discussed orthopedic referral  Greater than 50% of this 25 minute face to face visit was spent in counseling and discussion and coordination of care regarding the above diagnosis/diagnosies

## 2019-01-23 ENCOUNTER — Other Ambulatory Visit: Payer: Self-pay | Admitting: Family Medicine

## 2019-01-23 ENCOUNTER — Ambulatory Visit (HOSPITAL_COMMUNITY)
Admission: RE | Admit: 2019-01-23 | Discharge: 2019-01-23 | Disposition: A | Discharge status: Home / Self Care | Payer: PPO | Source: Ambulatory Visit | Attending: Family Medicine | Admitting: Family Medicine

## 2019-01-23 ENCOUNTER — Ambulatory Visit (HOSPITAL_COMMUNITY): Admission: RE | Admit: 2019-01-23 | Payer: PPO | Source: Ambulatory Visit

## 2019-01-23 ENCOUNTER — Other Ambulatory Visit: Payer: Self-pay

## 2019-01-23 DIAGNOSIS — M25562 Pain in left knee: Secondary | ICD-10-CM | POA: Diagnosis not present

## 2019-01-23 DIAGNOSIS — M7989 Other specified soft tissue disorders: Secondary | ICD-10-CM | POA: Diagnosis not present

## 2019-01-23 DIAGNOSIS — M79605 Pain in left leg: Secondary | ICD-10-CM | POA: Diagnosis not present

## 2019-01-31 ENCOUNTER — Telehealth: Payer: Self-pay | Admitting: Family Medicine

## 2019-01-31 NOTE — Telephone Encounter (Signed)
Please advise. Thank you

## 2019-01-31 NOTE — Telephone Encounter (Signed)
We referred the patient to an Orthopedic doctor but not the one she wants. She would like to see Dr. Alvan Dame.

## 2019-01-31 NOTE — Telephone Encounter (Signed)
Spoke with Izora Ribas regarding this referral. Izora Ribas states that it was sent to wrong dept. Izora Ribas states she would correct this and have it sent to [facility] where Dr.Olin is. I spoke with patient and pt verbalized understanding.

## 2019-01-31 NOTE — Telephone Encounter (Signed)
We requested olin she will have to spk to them about that,

## 2019-02-01 DIAGNOSIS — M25562 Pain in left knee: Secondary | ICD-10-CM | POA: Diagnosis not present

## 2019-02-20 ENCOUNTER — Other Ambulatory Visit: Payer: Self-pay | Admitting: Family Medicine

## 2019-02-20 ENCOUNTER — Other Ambulatory Visit: Payer: Self-pay | Admitting: Cardiology

## 2019-02-22 ENCOUNTER — Other Ambulatory Visit: Payer: Self-pay

## 2019-02-22 ENCOUNTER — Ambulatory Visit (INDEPENDENT_AMBULATORY_CARE_PROVIDER_SITE_OTHER): Payer: PPO | Admitting: Family Medicine

## 2019-02-22 DIAGNOSIS — M792 Neuralgia and neuritis, unspecified: Secondary | ICD-10-CM

## 2019-02-22 MED ORDER — GABAPENTIN 100 MG PO CAPS: 100.0000 mg | ORAL_CAPSULE | Freq: Three times a day (TID) | ORAL | 2 refills | Status: DC

## 2019-02-22 NOTE — Progress Notes (Signed)
   Subjective:  Audio only  Patient ID: Lacey Bailey, female    DOB: 1946/01/02, 73 y.o.   MRN: [id number]  HPI  Patient calls with a return of her left ear pain. Patient states she feels like an ice pick that is going in her ear. Patient says it hurts to swallow and eat and everything. It just sends pain all over body. It hurts so bad it makes her fuzzy headed.  Virtual Visit via Video Note  I connected with [name] on 02/22/19 at  3:00 PM EDT by a video enabled telemedicine application and verified that I am speaking with the correct person using two identifiers.  Location: Patient: home Provider: office   I discussed the limitations of evaluation and management by telemedicine and the availability of in person appointments. The patient expressed understanding and agreed to proceed.  History of Present Illness:    Observations/Objective:   Assessment and Plan:   Follow Up Instructions:    I discussed the assessment and treatment plan with the patient. The patient was provided an opportunity to ask questions and all were answered. The patient agreed with the plan and demonstrated an understanding of the instructions.   The patient was advised to call back or seek an in-person evaluation if the symptoms worsen or if the condition fails to improve as anticipated.  I provided 17minutes of non-face-to-face time during this encounter.   Pain is very sharp.  Sure is seen in nature.  Comes and goes.  Sudden knifelike pain.  Fairly severe.  Patient had similar episode a year and a half ago.  Utilize Neurontin with fair amount of success.  Would like a small number of oxycodone to use PRN   Review of Systems No headache, no major weight loss or weight gain, no chest pain no back pain abdominal pain no change in bowel habits complete ROS otherwise negative     Objective:   Physical Exam  Virtual      Assessment & Plan:  Impression neuropathic pain ear.  Discussed.   Will do trial of Neurontin.  And sparing oxycodone.  If persists neurology referral rationale discussed

## 2019-02-23 ENCOUNTER — Encounter: Payer: Self-pay | Admitting: Family Medicine

## 2019-02-23 MED ORDER — OXYCODONE-ACETAMINOPHEN 5-325 MG PO TABS: ORAL_TABLET | ORAL | 0 refills | Status: DC

## 2019-03-08 ENCOUNTER — Ambulatory Visit (INDEPENDENT_AMBULATORY_CARE_PROVIDER_SITE_OTHER): Payer: PPO | Admitting: Urology

## 2019-03-08 DIAGNOSIS — N3941 Urge incontinence: Secondary | ICD-10-CM | POA: Diagnosis not present

## 2019-03-08 DIAGNOSIS — N3281 Overactive bladder: Secondary | ICD-10-CM

## 2019-03-23 ENCOUNTER — Other Ambulatory Visit: Payer: Self-pay | Admitting: Family Medicine

## 2019-04-07 DIAGNOSIS — N3941 Urge incontinence: Secondary | ICD-10-CM | POA: Diagnosis not present

## 2019-04-07 DIAGNOSIS — N3281 Overactive bladder: Secondary | ICD-10-CM | POA: Diagnosis not present

## 2019-04-19 ENCOUNTER — Other Ambulatory Visit: Payer: Self-pay | Admitting: Family Medicine

## 2019-04-19 ENCOUNTER — Ambulatory Visit: Payer: PPO | Admitting: Urology

## 2019-04-19 DIAGNOSIS — N3281 Overactive bladder: Secondary | ICD-10-CM | POA: Diagnosis not present

## 2019-04-19 DIAGNOSIS — N3941 Urge incontinence: Secondary | ICD-10-CM

## 2019-04-24 ENCOUNTER — Other Ambulatory Visit: Payer: Self-pay

## 2019-04-25 ENCOUNTER — Ambulatory Visit: Payer: PPO | Admitting: Family Medicine

## 2019-04-27 ENCOUNTER — Ambulatory Visit (INDEPENDENT_AMBULATORY_CARE_PROVIDER_SITE_OTHER): Payer: PPO | Admitting: Family Medicine

## 2019-04-27 ENCOUNTER — Other Ambulatory Visit: Payer: Self-pay

## 2019-04-27 VITALS — BP 135/70

## 2019-04-27 DIAGNOSIS — E785 Hyperlipidemia, unspecified: Secondary | ICD-10-CM | POA: Diagnosis not present

## 2019-04-27 DIAGNOSIS — I10 Essential (primary) hypertension: Secondary | ICD-10-CM | POA: Diagnosis not present

## 2019-04-27 MED ORDER — ROSUVASTATIN CALCIUM 20 MG PO TABS: 20.0000 mg | ORAL_TABLET | Freq: Every day | ORAL | 1 refills | Status: DC

## 2019-04-27 MED ORDER — LEVOTHYROXINE SODIUM 75 MCG PO TABS: ORAL_TABLET | ORAL | 1 refills | Status: DC

## 2019-04-27 MED ORDER — GABAPENTIN 100 MG PO CAPS: 100.0000 mg | ORAL_CAPSULE | Freq: Three times a day (TID) | ORAL | 5 refills | Status: DC

## 2019-04-27 MED ORDER — PAROXETINE HCL 40 MG PO TABS: 40.0000 mg | ORAL_TABLET | Freq: Every day | ORAL | 1 refills | Status: DC

## 2019-04-27 MED ORDER — METOPROLOL SUCCINATE ER 25 MG PO TB24: 12.5000 mg | ORAL_TABLET | Freq: Every evening | ORAL | 1 refills | Status: DC

## 2019-04-27 NOTE — Progress Notes (Signed)
   Subjective:  Audio only  Patient ID: Lacey Bailey, female    DOB: 03-Apr-1946, 73 y.o.   MRN: [id number]  Pt states her bp was 135/70.  Hyperlipidemia This is a chronic problem. Compliance problems include adherence to diet (takes meds every day and eats healthy).    Pt states she is doing good. No concerns. Cant walk far with leg trouble but she is seeing ortho for that. Was doing cardiac rehab before covid and wants to know if she should start back in November when she returns from her trip.Going to [place] and [place] for the next month to visit grandchildren.    Blood pressure medicine and blood pressure levels reviewed today with patient. Compliant with blood pressure medicine. States does not miss a dose. No obvious side effects. Blood pressure generally good when checked elsewhere. Watching salt intake.   Patient continues to take lipid medication regularly. No obvious side effects from it. Generally does not miss a dose. Prior blood work results are reviewed with patient. Patient continues to work on fat intake in diet     Going to visit all thde g children    Virtual Visit via Telephone Note  I connected with [name] on 04/27/19 at  2:30 PM EDT by telephone and verified that I am speaking with the correct person using two identifiers.  Location: Patient: home Provider: office   I discussed the limitations, risks, security and privacy concerns of performing an evaluation and management service by telephone and the availability of in person appointments. I also discussed with the patient that there may be a patient responsible charge related to this service. The patient expressed understanding and agreed to proceed.   History of Present Illness:    Observations/Objective:   Assessment and Plan:   Follow Up Instructions:    I discussed the assessment and treatment plan with the patient. The patient was provided an opportunity to ask questions and all were  answered. The patient agreed with the plan and demonstrated an understanding of the instructions.   The patient was advised to call back or seek an in-person evaluation if the symptoms worsen or if the condition fails to improve as anticipated.  I provided 25 minutes of non-face-to-face time during this encounter.     Review of Systems No headache, no major weight loss or weight gain, no chest pain no back pain abdominal pain no change in bowel habits complete ROS otherwise negative    Objective:   Physical Exam   Virtual     Assessment & Plan:  Impression 1 hypertension.  Blood pressure control reviewed.  Compliance reviewed.  Salt intake discussed.  Medications prescribed.  2.  Hyperlipidemia.  Prior blood work reviewed.  Will add blood test to next blood draw.  Compliance discussed diet discussed  3.  Coronavirus concerns.  Discussed at great length.  Patient is planning a trip out [place] to [place] in just a couple weeks.  This despite having tremendous number of risk factors for the COVID-19 infection.  Very long discussion held in this regard.  By the end of the discussion the patient had changed her mind and is going to hold off on the trip  Greater than 50% of this none 25 minute face to face visit was spent in counseling and discussion and coordination of care regarding the above diagnosis/diagnosies

## 2019-05-01 ENCOUNTER — Encounter: Payer: Self-pay | Admitting: Family Medicine

## 2019-05-26 ENCOUNTER — Telehealth: Payer: Self-pay | Admitting: Cardiology

## 2019-05-26 NOTE — Telephone Encounter (Signed)

## 2019-05-31 ENCOUNTER — Ambulatory Visit (INDEPENDENT_AMBULATORY_CARE_PROVIDER_SITE_OTHER): Payer: PPO | Admitting: Urology

## 2019-05-31 DIAGNOSIS — N3281 Overactive bladder: Secondary | ICD-10-CM | POA: Diagnosis not present

## 2019-05-31 DIAGNOSIS — N3941 Urge incontinence: Secondary | ICD-10-CM

## 2019-06-07 ENCOUNTER — Other Ambulatory Visit: Payer: Self-pay

## 2019-06-07 ENCOUNTER — Ambulatory Visit (INDEPENDENT_AMBULATORY_CARE_PROVIDER_SITE_OTHER): Payer: PPO | Admitting: Family Medicine

## 2019-06-07 DIAGNOSIS — R0683 Snoring: Secondary | ICD-10-CM

## 2019-06-07 DIAGNOSIS — R5383 Other fatigue: Secondary | ICD-10-CM

## 2019-06-07 DIAGNOSIS — R059 Cough, unspecified: Secondary | ICD-10-CM

## 2019-06-07 DIAGNOSIS — R05 Cough: Secondary | ICD-10-CM

## 2019-06-07 NOTE — Progress Notes (Signed)
   Subjective:    Patient ID: Lacey Bailey, female    DOB: July 01, 1946, 73 y.o.   MRN: [id number]  HPI wants to discuss possible sleep apnea. States she feels tired all the time. States when she takes a shower she has to lay down and rest afterwards.   Husband states she snores.   Deep burning cough for about 6 months.   Virtual Visit via Telephone Note  I connected with Lacey Bailey on 06/07/19 at  3:00 PM EDT by telephone and verified that I am speaking with the correct person using two identifiers.  Location: Patient: home Provider: office   I discussed the limitations, risks, security and privacy concerns of performing an evaluation and management service by telephone and the availability of in person appointments. I also discussed with the patient that there may be a patient responsible charge related to this service. The patient expressed understanding and agreed to proceed.   History of Present Illness:    Observations/Objective:   Assessment and Plan:   Follow Up Instructions:    I discussed the assessment and treatment plan with the patient. The patient was provided an opportunity to ask questions and all were answered. The patient agreed with the plan and demonstrated an understanding of the instructions.   The patient was advised to call back or seek an in-person evaluation if the symptoms worsen or if the condition fails to improve as anticipated.  I provided 25 minutes of non-face-to-face time during this encounter.   Patient notes substantial daytime fatigue.  Tired all the time.  No energy.  Very sleepy.  Takes naps very easily.  Reports difficulty sleeping at night.  Substantial challenges with snoring.  Significantly worse the past couple of years.  Patient's husband reports intermittent apnea throughout the night.  Patient has gained quite a bit of weight over the last 5 years  Patient also notes cough off and on.  Feels deep.  For 6 months now.   Generally nonproductive.  Note shortness of breath no nature of reflux or postnasal drip      Review of Systems No chest pain no headache    Objective:   Physical Exam  Virtual      Assessment & Plan:  Impression 1 probable obstructive sleep apnea.  Discussed.  Patient certainly has no risk factors with her coronary artery disease and substantial obesity.  Also Burlin score shows high propensity.  Therefore recommend sleep study rationale discussed  2.  Chronic cough discussed we will start with a chest x-ray no further work-up at this time

## 2019-06-12 ENCOUNTER — Encounter: Payer: Self-pay | Admitting: Family Medicine

## 2019-06-13 ENCOUNTER — Telehealth (INDEPENDENT_AMBULATORY_CARE_PROVIDER_SITE_OTHER): Payer: PPO | Admitting: Cardiology

## 2019-06-13 ENCOUNTER — Encounter: Payer: Self-pay | Admitting: Cardiology

## 2019-06-13 DIAGNOSIS — I25709 Atherosclerosis of coronary artery bypass graft(s), unspecified, with unspecified angina pectoris: Secondary | ICD-10-CM

## 2019-06-13 DIAGNOSIS — I10 Essential (primary) hypertension: Secondary | ICD-10-CM | POA: Diagnosis not present

## 2019-06-13 DIAGNOSIS — E782 Mixed hyperlipidemia: Secondary | ICD-10-CM

## 2019-06-13 MED ORDER — ISOSORBIDE MONONITRATE ER 30 MG PO TB24: 30.0000 mg | ORAL_TABLET | Freq: Two times a day (BID) | ORAL | 3 refills | Status: DC

## 2019-06-13 NOTE — Patient Instructions (Signed)
Medication Instructions:  INCREASE Imdur to 30 mg TWICE a day  *If you need a refill on your cardiac medications before your next appointment, please call your pharmacy*  Lab Work: None today If you have labs (blood work) drawn today and your tests are completely normal, you will receive your results only by: Marland Kitchen MyChart Message (if you have MyChart) OR . A paper copy in the mail If you have any lab test that is abnormal or we need to change your treatment, we will call you to review the results.  Testing/Procedures: None today  Follow-Up: At [facility], you and your health needs are our priority.  As part of our continuing mission to provide you with exceptional heart care, we have created designated Provider Care Teams.  These Care Teams include your primary Cardiologist (physician) and Advanced Practice Providers (APPs -  Physician Assistants and Nurse Practitioners) who all work together to provide you with the care you need, when you need it.  Your next appointment:   3 months  The format for your next appointment:   Either In Person or Virtual  Provider:   You may see Rozann Lesches, MD or one of the following Advanced Practice Providers on your designated Care Team:    Bernerd Pho, PA-C   Ermalinda Barrios, PA-C    Other Instructions None     Thank you for choosing [facility] !

## 2019-06-13 NOTE — Progress Notes (Signed)
Virtual Visit via Telephone Note   This visit type was conducted due to national recommendations for restrictions regarding the COVID-19 Pandemic (e.g. social distancing) in an effort to limit this patient's exposure and mitigate transmission in our community.  Due to her co-morbid illnesses, this patient is at least at moderate risk for complications without adequate follow up.  This format is felt to be most appropriate for this patient at this time.  The patient did not have access to video technology/had technical difficulties with video requiring transitioning to audio format only (telephone).  All issues noted in this document were discussed and addressed.  No physical exam could be performed with this format.  Please refer to the patient's chart for her  consent to telehealth for [facility].   Date:  06/13/2019   ID:  Lacey, Bailey 06-06-1946, MRN [id number]  Patient Location: Home Provider Location: Office  PCP:  Merlyn Albert, MD  Cardiologist:  Nona Dell, MD Electrophysiologist:  None   Evaluation Performed:  Follow-Up Visit  Chief Complaint:   Cardiac follow-up  History of Present Illness:    Lacey Bailey is a 73 y.o. female last seen in March by Ms. Strader PA-C.  We spoke by phone today.  I also talked with her husband.  She has been gradually slowing down in terms of stamina, reports fatigue and shortness of breath with ADLs.  She does not have progressive angina to the extent that she has to use nitroglycerin more than once every few weeks however.  As noted previously, she does not have good revascularization options and we are managing her medically.  I reviewed her medications which are outlined below.  We discussed increasing Imdur to 30 mg twice daily.  Ranexa would be another consideration.  She has gained weight due to inactivity, would like to do some basic walking as much as tolerated.  I talked with them both about the progressive nature of  ischemic heart disease and her symptoms particularly in light of poor revascularization strategies.  The patient does not have symptoms concerning for COVID-19 infection (fever, chills, cough, or new shortness of breath).    Past Medical History:  Diagnosis Date  . Anginal pain (HCC)   . Aortic sclerosis   . CAD (coronary artery disease)    a. s/p CABG in 2003 b. multiple interventions since at outside hospitals c. DES to PLB in 11/2014 d. angioplasty to mid-RCA and mid-LCx in 10/2015 with patent LIMA-LAD and occlusion of all vein grafts e. 12/2017: occlusion of vein grafts and native LCx and RCA with patent LIMA-LAD. Re-do CABG NOT recommended by CT surgery  . Depression   . Essential hypertension   . Hyperlipidemia   . Hypothyroidism   . Kidney stones   . TIA (transient ischemic attack) ~ 2013   Past Surgical History:  Procedure Laterality Date  . ABDOMINAL HYSTERECTOMY    . APPENDECTOMY    . BLEPHAROPLASTY Bilateral    "uppers"  . CARDIAC CATHETERIZATION  11/22/2015  . CARDIAC CATHETERIZATION N/A 11/22/2015   Procedure: Left Heart Cath and Cors/Grafts Angiography;  Surgeon: Marykay Lex, MD;  Location: [facility] INVASIVE CV LAB;  Service: Cardiovascular;  Laterality: N/A;  . CARDIAC CATHETERIZATION N/A 11/22/2015   Procedure: Coronary Stent Intervention;  Surgeon: Marykay Lex, MD;  Location: [facility] INVASIVE CV LAB;  Service: Cardiovascular;  Laterality: N/A;  . CATARACT EXTRACTION, BILATERAL Bilateral   . CESAREAN SECTION  1977  . CORONARY ANGIOPLASTY WITH  STENT PLACEMENT  "several"   "last one was 11/2014:  DES to the posterolateral branch /notes 11/20/2015  . CORONARY ARTERY BYPASS GRAFT  2003   [place]; CABG X 4  . DILATION AND CURETTAGE OF UTERUS    . KIDNEY STONE SURGERY     "opened me up"  . LAPAROSCOPIC CHOLECYSTECTOMY    . LEFT HEART CATH AND CORS/GRAFTS ANGIOGRAPHY N/A 01/03/2018   Procedure: LEFT HEART CATH AND CORS/GRAFTS ANGIOGRAPHY;  Surgeon: [name], Peter M, MD;   Location: [facility] INVASIVE CV LAB;  Service: Cardiovascular;  Laterality: N/A;  . TONSILLECTOMY    . TUBAL LIGATION       Current Meds  Medication Sig  . albuterol (PROVENTIL HFA;VENTOLIN HFA) 108 (90 Base) MCG/ACT inhaler Inhale 2 puffs into the lungs every 6 (six) hours as needed for wheezing or shortness of breath.  Marland Kitchen aspirin 81 MG tablet Take 81 mg by mouth daily.  Marland Kitchen gabapentin (NEURONTIN) 100 MG capsule Take 1 capsule (100 mg total) by mouth 3 (three) times daily.  . isosorbide mononitrate (IMDUR) 30 MG 24 hr tablet Take 1 tablet (30 mg total) by mouth 2 (two) times daily.  Marland Kitchen levothyroxine (SYNTHROID) 75 MCG tablet TAKE 1 TABLET BY MOUTH EVERY DAY BEFORE BREAKFAST  . lisinopril (PRINIVIL,ZESTRIL) 5 MG tablet TAKE 1 TABLET BY MOUTH EVERY DAY  . metoprolol succinate (TOPROL-XL) 25 MG 24 hr tablet Take 0.5 tablets (12.5 mg total) by mouth every evening.  . nitroGLYCERIN (NITROSTAT) 0.4 MG SL tablet PLACE 1 TABLET UNDER TONGUE EVERY 5 MINUTES AS NEEDED FOR CHEST PAIN. IF NO RELIEF AFTER THE 3RD DOSE, PROCEED TO THE ER FOR AN EVALUATION  . oxyCODONE-acetaminophen (PERCOCET/ROXICET) 5-325 MG tablet Take 1/2-1 tablet by mouth q 4-6 hours as needed for pain  . pantoprazole (PROTONIX) 40 MG tablet TAKE 1 TABLET BY MOUTH EVERY DAY  . PARoxetine (PAXIL) 40 MG tablet Take 1 tablet (40 mg total) by mouth daily.  . rosuvastatin (CRESTOR) 20 MG tablet Take 1 tablet (20 mg total) by mouth daily.  . [DISCONTINUED] isosorbide mononitrate (IMDUR) 30 MG 24 hr tablet Take 30 mg by mouth daily.     Allergies:   Patient has no known allergies.   Social History   Tobacco Use  . Smoking status: Never Smoker  . Smokeless tobacco: Never Used  Substance Use Topics  . Alcohol use: Not Currently    Alcohol/week: 0.0 standard drinks    Comment: 11/22/2015 "nothing since ~ 2005; occasionally had a drink w/dinner before 2005"  . Drug use: No     Family Hx: The patient's family history includes Heart attack in her  mother; Heart disease in her brother and mother.  ROS:   Please see the history of present illness. All other systems reviewed and are negative.   Prior CV studies:   The following studies were reviewed today:  Cardiac catheterization 01/03/2018:  Post Atrio lesion is 20% stenosed.  Prox RCA to Mid RCA lesion is 100% stenosed.  Prox Cx to Dist Cx lesion is 100% stenosed.  Ost LAD to Prox LAD lesion is 100% stenosed.  LIMA graft was visualized by angiography and is large.  The graft exhibits no disease.  SVG graft was visualized by angiography.  SVG graft was visualized by angiography.  SVG graft was visualized by angiography.  Origin lesion is 100% stenosed.  Origin to Prox Graft lesion is 100% stenosed.  Origin to Prox Graft lesion is 100% stenosed.  The left ventricular systolic function is  normal.  LV end diastolic pressure is normal.  The left ventricular ejection fraction is 55-65% by visual estimate.  1. Severe 3 vessel occlusive CAD 2. Patent LIMA to the LAD 3. Occluded SVG to diagonal 4. Occluded SVG to the OM2 5. Occluded SVG to the PDA 6. Good LV function 7. Normal LVEDP  Plan: Compared to prior cardiac cath in March 2017 the stents in the mid to distal LCx and mid RCA are completely occluded. Given extensive work done on these vessels previously she is not a candidate for CTO PCI.  Echocardiogram 05/19/2017: Study Conclusions  - Left ventricle: The cavity size was normal. Wall thickness was increased in a pattern of moderate LVH. Systolic function was normal. The estimated ejection fraction was in the range of 55% to 60%. Wall motion was normal; there were no regional wall motion abnormalities. Doppler parameters are consistent with abnormal left ventricular relaxation (grade 1 diastolic dysfunction). There was no evidence of elevated ventricular filling pressure by Doppler parameters. - Aortic valve: Mildly calcified annulus.  Trileaflet; mildly thickened leaflets. Valve area (VTI): 2.36 cm^2. Valve area (Vmax): 1.98 cm^2. Valve area (Vmean): 2.13 cm^2. - Atrial septum: No defect or patent foramen ovale was identified. - Technically adequate study.   Labs/Other Tests and Data Reviewed:    EKG:  An ECG dated 05/23/2018 was personally reviewed today and demonstrated:  Sinus rhythm with LVH and diffuse ST-T wave abnormalities.  Recent Labs: 07/04/2018: BUN 18; Creatinine, Ser 1.15; Potassium 4.3; Sodium 141 11/08/2018: ALT 16; Hemoglobin 13.4; Platelets 346; TSH 3.840   Recent Lipid Panel Lab Results  Component Value Date/Time   CHOL 139 11/08/2018 10:15 AM   TRIG 194 (H) 11/08/2018 10:15 AM   HDL 37 (L) 11/08/2018 10:15 AM   CHOLHDL 3.8 11/08/2018 10:15 AM   CHOLHDL 6.0 04/20/2017 11:00 AM   LDLCALC 63 11/08/2018 10:15 AM    Wt Readings from Last 3 Encounters:  06/13/19 240 lb (108.9 kg)  01/16/19 220 lb (99.8 kg)  11/03/18 228 lb (103.4 kg)     Objective:    Vital Signs:  BP (!) 157/73   Pulse (!) 54   Ht 5\' 2"  (1.575 m)   Wt 240 lb (108.9 kg)   BMI 43.90 kg/m    Patient spoke in full sentences, not short of breath. No audible wheezing or coughing.  ASSESSMENT & PLAN:    1.  Multivessel CAD status post CABG with graft disease.  All vein grafts are occluded, LIMA to LAD is patent as of last cardiac catheterization in May 2019.  She also has occlusion of stent sites within the mid to distal circumflex and mid RCA.  Revascularization options are poor, she was not felt to be a good candidate for redo CABG or CTO intervention.  We will continue with medical therapy, increase Imdur to 30 mg twice daily.  2.  Essential hypertension, systolic is in the 160F today.  Imdur is being increased.  She also has room to increase ACE inhibitor if needed.  3.  Mixed hyperlipidemia, she continues on Crestor.  Last LDL was 63.  COVID-19 Education: The signs and symptoms of COVID-19 were discussed with the  patient and how to seek care for testing (follow up with PCP or arrange E-visit).  The importance of social distancing was discussed today.  Time:   Today, I have spent 9 minutes with the patient with telehealth technology discussing the above problems.     Medication Adjustments/Labs and Tests Ordered: Current  medicines are reviewed at length with the patient today.  Concerns regarding medicines are outlined above.   Tests Ordered: No orders of the defined types were placed in this encounter.   Medication Changes: Meds ordered this encounter  Medications  . isosorbide mononitrate (IMDUR) 30 MG 24 hr tablet    Sig: Take 1 tablet (30 mg total) by mouth 2 (two) times daily.    Dispense:  180 tablet    Refill:  3    06/13/2019 dose increased from qd to BID    Follow Up:  In person or virtual visit in 3 months.   Signed, Nona DellSamuel Tavie Haseman, MD  06/13/2019 11:57 AM    [facility]

## 2019-07-03 ENCOUNTER — Other Ambulatory Visit (HOSPITAL_COMMUNITY): Payer: Self-pay | Admitting: Family Medicine

## 2019-07-03 DIAGNOSIS — Z1231 Encounter for screening mammogram for malignant neoplasm of breast: Secondary | ICD-10-CM

## 2019-07-24 ENCOUNTER — Other Ambulatory Visit: Payer: Self-pay

## 2019-07-24 ENCOUNTER — Ambulatory Visit (HOSPITAL_COMMUNITY)
Admission: RE | Admit: 2019-07-24 | Discharge: 2019-07-24 | Disposition: A | Discharge status: Home / Self Care | Payer: PPO | Source: Ambulatory Visit | Attending: Family Medicine | Admitting: Family Medicine

## 2019-07-24 DIAGNOSIS — Z1231 Encounter for screening mammogram for malignant neoplasm of breast: Secondary | ICD-10-CM | POA: Diagnosis not present

## 2019-08-10 ENCOUNTER — Other Ambulatory Visit: Payer: Self-pay | Admitting: Cardiology

## 2019-08-14 ENCOUNTER — Other Ambulatory Visit: Payer: Self-pay | Admitting: Cardiology

## 2019-08-15 ENCOUNTER — Encounter: Payer: Self-pay | Admitting: Urology

## 2019-09-05 ENCOUNTER — Ambulatory Visit: Payer: PPO | Admitting: Cardiology

## 2019-09-05 NOTE — Progress Notes (Deleted)
Cardiology Office Note  Date: 09/05/2019   ID: Lacey Bailey 24-May-1946, MRN [id number]  PCP:  Mikey Kirschner, MD  Cardiologist:  Rozann Lesches, MD Electrophysiologist:  None   No chief complaint on file.   History of Present Illness: Lacey Bailey is a 74 y.o. female last assessed via telehealth encounter in October 2020.  At last visit Imdur was increased to 30 mg twice daily.  Past Medical History:  Diagnosis Date  . Anginal pain ([place])   . Aortic sclerosis   . CAD (coronary artery disease)    a. s/p CABG in 2003 b. multiple interventions since at outside hospitals c. DES to PLB in 11/2014 d. angioplasty to mid-RCA and mid-LCx in 10/2015 with patent LIMA-LAD and occlusion of all vein grafts e. 12/2017: occlusion of vein grafts and native LCx and RCA with patent LIMA-LAD. Re-do CABG NOT recommended by CT surgery  . Depression   . Essential hypertension   . Hyperlipidemia   . Hypothyroidism   . Kidney stones   . TIA (transient ischemic attack) ~ 2013    Past Surgical History:  Procedure Laterality Date  . ABDOMINAL HYSTERECTOMY    . APPENDECTOMY    . BLEPHAROPLASTY Bilateral    "uppers"  . CARDIAC CATHETERIZATION  11/22/2015  . CARDIAC CATHETERIZATION N/A 11/22/2015   Procedure: Left Heart Cath and Cors/Grafts Angiography;  Surgeon: Leonie Man, MD;  Location: [place] CV LAB;  Service: Cardiovascular;  Laterality: N/A;  . CARDIAC CATHETERIZATION N/A 11/22/2015   Procedure: Coronary Stent Intervention;  Surgeon: Leonie Man, MD;  Location: [place] CV LAB;  Service: Cardiovascular;  Laterality: N/A;  . CATARACT EXTRACTION, BILATERAL Bilateral   . CESAREAN SECTION  1977  . CORONARY ANGIOPLASTY WITH STENT PLACEMENT  "several"   "last one was 11/2014:  DES to the posterolateral branch /notes 11/20/2015  . CORONARY ARTERY BYPASS GRAFT  2003   [place]; CABG X 4  . DILATION AND CURETTAGE OF UTERUS    . KIDNEY STONE SURGERY     "opened me up"  .  LAPAROSCOPIC CHOLECYSTECTOMY    . LEFT HEART CATH AND CORS/GRAFTS ANGIOGRAPHY N/A 01/03/2018   Procedure: LEFT HEART CATH AND CORS/GRAFTS ANGIOGRAPHY;  Surgeon: Martinique, Peter M, MD;  Location: [place] CV LAB;  Service: Cardiovascular;  Laterality: N/A;  . TONSILLECTOMY    . TUBAL LIGATION      Current Outpatient Medications  Medication Sig Dispense Refill  . albuterol (PROVENTIL HFA;VENTOLIN HFA) 108 (90 Base) MCG/ACT inhaler Inhale 2 puffs into the lungs every 6 (six) hours as needed for wheezing or shortness of breath. 1 Inhaler 0  . aspirin 81 MG tablet Take 81 mg by mouth daily.    Marland Kitchen gabapentin (NEURONTIN) 100 MG capsule Take 1 capsule (100 mg total) by mouth 3 (three) times daily. 90 capsule 5  . isosorbide mononitrate (IMDUR) 30 MG 24 hr tablet Take 1 tablet (30 mg total) by mouth 2 (two) times daily. 180 tablet 3  . levothyroxine (SYNTHROID) 75 MCG tablet TAKE 1 TABLET BY MOUTH EVERY DAY BEFORE BREAKFAST 90 tablet 1  . lisinopril (ZESTRIL) 5 MG tablet TAKE 1 TABLET BY MOUTH EVERY DAY 30 tablet 11  . metoprolol succinate (TOPROL-XL) 25 MG 24 hr tablet Take 0.5 tablets (12.5 mg total) by mouth every evening. 45 tablet 1  . nitroGLYCERIN (NITROSTAT) 0.4 MG SL tablet DISSOLVE 1 TABLET UNDER THE TONGUE EVERY 5 MINUTES AS NEEDED FOR CHEST PAIN. DO NOT EXCEED  A TOTAL OF 3 DOSES IN 15 MINUTES. 25 tablet 3  . oxyCODONE-acetaminophen (PERCOCET/ROXICET) 5-325 MG tablet Take 1/2-1 tablet by mouth q 4-6 hours as needed for pain 24 tablet 0  . pantoprazole (PROTONIX) 40 MG tablet TAKE 1 TABLET BY MOUTH EVERY DAY 90 tablet 3  . PARoxetine (PAXIL) 40 MG tablet Take 1 tablet (40 mg total) by mouth daily. 90 tablet 1  . rosuvastatin (CRESTOR) 20 MG tablet Take 1 tablet (20 mg total) by mouth daily. 90 tablet 1   No current facility-administered medications for this visit.   Allergies:  Patient has no known allergies.   Social History: The patient  reports that she has never smoked. She has never  used smokeless tobacco. She reports previous alcohol use. She reports that she does not use drugs.   Family History: The patient's family history includes Heart attack in her mother; Heart disease in her brother and mother.   ROS:  Please see the history of present illness. Otherwise, complete review of systems is positive for {NONE DEFAULTED:18576::"none"}.  All other systems are reviewed and negative.   Physical Exam: VS:  There were no vitals taken for this visit., BMI There is no height or weight on file to calculate BMI.  Wt Readings from Last 3 Encounters:  06/13/19 240 lb (108.9 kg)  01/16/19 220 lb (99.8 kg)  11/03/18 228 lb (103.4 kg)    General: Patient appears comfortable at rest. HEENT: Conjunctiva and lids normal, oropharynx clear with moist mucosa. Neck: Supple, no elevated JVP or carotid bruits, no thyromegaly. Lungs: Clear to auscultation, nonlabored breathing at rest. Cardiac: Regular rate and rhythm, no S3 or significant systolic murmur, no pericardial rub. Abdomen: Soft, nontender, no hepatomegaly, bowel sounds present, no guarding or rebound. Extremities: No pitting edema, distal pulses 2+. Skin: Warm and dry. Musculoskeletal: No kyphosis. Neuropsychiatric: Alert and oriented x3, affect grossly appropriate.  ECG:  An ECG dated 05/23/2018 was personally reviewed today and demonstrated:  Sinus rhythm with LVH, diffuse ST-T wave abnormalities.  Recent Labwork: 11/08/2018: ALT 16; AST 22; Hemoglobin 13.4; Platelets 346; TSH 3.840     Component Value Date/Time   CHOL 139 11/08/2018 1015   TRIG 194 (H) 11/08/2018 1015   HDL 37 (L) 11/08/2018 1015   CHOLHDL 3.8 11/08/2018 1015   CHOLHDL 6.0 04/20/2017 1100   VLDL 69 (H) 04/20/2017 1100   LDLCALC 63 11/08/2018 1015    Other Studies Reviewed Today:  Cardiac catheterization 01/03/2018:  Post Atrio lesion is 20% stenosed.  Prox RCA to Mid RCA lesion is 100% stenosed.  Prox Cx to Dist Cx lesion is 100%  stenosed.  Ost LAD to Prox LAD lesion is 100% stenosed.  LIMA graft was visualized by angiography and is large.  The graft exhibits no disease.  SVG graft was visualized by angiography.  SVG graft was visualized by angiography.  SVG graft was visualized by angiography.  Origin lesion is 100% stenosed.  Origin to Prox Graft lesion is 100% stenosed.  Origin to Prox Graft lesion is 100% stenosed.  The left ventricular systolic function is normal.  LV end diastolic pressure is normal.  The left ventricular ejection fraction is 55-65% by visual estimate.  1. Severe 3 vessel occlusive CAD 2. Patent LIMA to the LAD 3. Occluded SVG to diagonal 4. Occluded SVG to the OM2 5. Occluded SVG to the PDA 6. Good LV function 7. Normal LVEDP  Plan: Compared to prior cardiac cath in March 2017 the stents in the  mid to distal LCx and mid RCA are completely occluded. Given extensive work done on these vessels previously she is not a candidate for CTO PCI.  Echocardiogram 05/19/2017: Study Conclusions  - Left ventricle: The cavity size was normal. Wall thickness was increased in a pattern of moderate LVH. Systolic function was normal. The estimated ejection fraction was in the range of 55% to 60%. Wall motion was normal; there were no regional wall motion abnormalities. Doppler parameters are consistent with abnormal left ventricular relaxation (grade 1 diastolic dysfunction). There was no evidence of elevated ventricular filling pressure by Doppler parameters. - Aortic valve: Mildly calcified annulus. Trileaflet; mildly thickened leaflets. Valve area (VTI): 2.36 cm^2. Valve area (Vmax): 1.98 cm^2. Valve area (Vmean): 2.13 cm^2. - Atrial septum: No defect or patent foramen ovale was identified. - Technically adequate study.  Assessment and Plan:   Medication Adjustments/Labs and Tests Ordered: Current medicines are reviewed at length with the patient today.   Concerns regarding medicines are outlined above.   Tests Ordered: No orders of the defined types were placed in this encounter.   Medication Changes: No orders of the defined types were placed in this encounter.   Disposition:  Follow up {follow up:15908}  Signed, Jonelle Sidle, MD, [facility] 09/05/2019 12:52 PM    [facility] at [facility] [address], [place], [place] [address] Phone: [phone]; Fax: [phone]

## 2019-09-13 ENCOUNTER — Ambulatory Visit: Payer: PPO | Admitting: Cardiology

## 2019-09-28 ENCOUNTER — Encounter: Payer: Self-pay | Admitting: Family Medicine

## 2019-11-14 ENCOUNTER — Other Ambulatory Visit: Payer: Self-pay | Admitting: Family Medicine

## 2019-12-06 ENCOUNTER — Other Ambulatory Visit: Payer: Self-pay

## 2019-12-06 ENCOUNTER — Other Ambulatory Visit: Payer: Self-pay | Admitting: Family Medicine

## 2019-12-06 ENCOUNTER — Emergency Department (HOSPITAL_COMMUNITY): Admission: EM | Admit: 2019-12-06 | Discharge: 2019-12-06 | Discharge status: Absent without leave | Payer: PPO

## 2019-12-06 ENCOUNTER — Ambulatory Visit: Payer: PPO | Attending: Family Medicine | Admitting: Neurology

## 2019-12-06 DIAGNOSIS — Z79899 Other long term (current) drug therapy: Secondary | ICD-10-CM | POA: Diagnosis not present

## 2019-12-06 DIAGNOSIS — Z7982 Long term (current) use of aspirin: Secondary | ICD-10-CM | POA: Diagnosis not present

## 2019-12-06 DIAGNOSIS — R0683 Snoring: Secondary | ICD-10-CM

## 2019-12-06 DIAGNOSIS — G4733 Obstructive sleep apnea (adult) (pediatric): Secondary | ICD-10-CM | POA: Insufficient documentation

## 2019-12-06 DIAGNOSIS — Z7901 Long term (current) use of anticoagulants: Secondary | ICD-10-CM | POA: Insufficient documentation

## 2019-12-06 DIAGNOSIS — R5383 Other fatigue: Secondary | ICD-10-CM | POA: Insufficient documentation

## 2019-12-10 NOTE — Procedures (Signed)
  [facility] Aleda Madl A. Gerilyn Pilgrim, MD     [email or web address]             HOME SLEEP STUDY  LOCATION: [facility]  Patient Name: Lacey Bailey, Lacey Bailey Date: 12/06/2019 Gender: Female D.O.B: 06/23/1946 Age (years): 69 Referring Provider: Donna Bernard Height (inches): 62 Interpreting Physician: Beryle Beams MD, ABSM Weight (lbs): 240 RPSGT: Peak, Robert BMI: 44 MRN: [id number] Neck Size: CLINICAL INFORMATION Sleep Study Type: HST     Indication for sleep study: N/A     Epworth Sleepiness Score: NA  SLEEP STUDY TECHNIQUE A multi-channel overnight portable sleep study was performed. The channels recorded were: nasal airflow, thoracic respiratory movement, and oxygen saturation with a pulse oximetry. Snoring was also monitored.  MEDICATIONS Patient self administered medications include: N/A.  Current Outpatient Medications:  .  albuterol (PROVENTIL HFA;VENTOLIN HFA) 108 (90 Base) MCG/ACT inhaler, Inhale 2 puffs into the lungs every 6 (six) hours as needed for wheezing or shortness of breath., Disp: 1 Inhaler, Rfl: 0 .  aspirin 81 MG tablet, Take 81 mg by mouth daily., Disp: , Rfl:  .  gabapentin (NEURONTIN) 100 MG capsule, TAKE ONE CAPSULE BY MOUTH THREE TIMES DAILY, Disp: 90 capsule, Rfl: 5 .  isosorbide mononitrate (IMDUR) 30 MG 24 hr tablet, Take 1 tablet (30 mg total) by mouth 2 (two) times daily., Disp: 180 tablet, Rfl: 3 .  levothyroxine (SYNTHROID) 75 MCG tablet, TAKE 1 TABLET BY MOUTH DAILY BEFORE BREAKFAST, Disp: 90 tablet, Rfl: 1 .  lisinopril (ZESTRIL) 5 MG tablet, TAKE 1 TABLET BY MOUTH EVERY DAY, Disp: 30 tablet, Rfl: 11 .  metoprolol succinate (TOPROL-XL) 25 MG 24 hr tablet, TAKE 1/2 TABLET BY MOUTH EVERY EVENING, Disp: 45 tablet, Rfl: 1 .  nitroGLYCERIN (NITROSTAT) 0.4 MG SL tablet, DISSOLVE 1 TABLET UNDER THE TONGUE EVERY 5 MINUTES AS NEEDED FOR CHEST PAIN. DO NOT EXCEED A TOTAL OF 3 DOSES IN 15 MINUTES., Disp: 25 tablet, Rfl: 3 .   oxyCODONE-acetaminophen (PERCOCET/ROXICET) 5-325 MG tablet, Take 1/2-1 tablet by mouth q 4-6 hours as needed for pain, Disp: 24 tablet, Rfl: 0 .  pantoprazole (PROTONIX) 40 MG tablet, TAKE 1 TABLET BY MOUTH EVERY DAY, Disp: 90 tablet, Rfl: 3 .  PARoxetine (PAXIL) 40 MG tablet, Take 1 tablet (40 mg total) by mouth daily., Disp: 90 tablet, Rfl: 1 .  rosuvastatin (CRESTOR) 20 MG tablet, TAKE 1 TABLET BY MOUTH DAILY, Disp: 90 tablet, Rfl: 1   SLEEP ARCHITECTURE Patient was studied for 597.5 minutes. The sleep efficiency was 90.5 % and the patient was supine for 62.6%. The arousal index was 0.0 per hour.  RESPIRATORY PARAMETERS The overall AHI was 43.3 per hour, with a central apnea index of 2.5 per hour.  The oxygen nadir was 57% during sleep.     CARDIAC DATA Mean heart rate during sleep was 67.1 bpm.  IMPRESSIONS Severe obstructive sleep apnea occurred during this study (AHI = 43.3/h).  AutoPAP 8-15 is recommended.   Argie Ramming, MD Diplomate, American Board of Sleep Medicine.  ELECTRONICALLY SIGNED ON:  12/10/2019, 10:58 PM [facility] PH: [phone]   FX: [phone] ACCREDITED BY THE AMERICAN ACADEMY OF SLEEP MEDICINE

## 2019-12-27 ENCOUNTER — Telehealth: Payer: Self-pay | Admitting: Family Medicine

## 2019-12-27 NOTE — Telephone Encounter (Signed)
Pt is checking on her sleep study results.

## 2019-12-28 NOTE — Telephone Encounter (Signed)
Let pt know sleep study shows significant sleep apnea. My apologies on delay hearing this but dr Peggye Form, despite my repeated requests, does not send the results to [place]. Needs cpap machine autopap. 8-15. This will help her sleep and daytime enrrgy also

## 2019-12-28 NOTE — Telephone Encounter (Signed)
Left message to return call 

## 2019-12-28 NOTE — Telephone Encounter (Signed)
Please advise if/what I need to do for this. [employer]

## 2020-01-02 NOTE — Telephone Encounter (Signed)
Please sign order for AutoPAP so I may fax with all required documentation   In green folder in yellow box

## 2020-01-18 DIAGNOSIS — G4733 Obstructive sleep apnea (adult) (pediatric): Secondary | ICD-10-CM | POA: Diagnosis not present

## 2020-02-01 ENCOUNTER — Other Ambulatory Visit: Payer: Self-pay | Admitting: Cardiology

## 2020-02-02 ENCOUNTER — Other Ambulatory Visit: Payer: Self-pay

## 2020-02-02 ENCOUNTER — Ambulatory Visit: Payer: PPO | Admitting: Family Medicine

## 2020-02-02 ENCOUNTER — Encounter: Payer: Self-pay | Admitting: Family Medicine

## 2020-02-02 VITALS — BP 98/68 | HR 80 | Ht 62.5 in | Wt 230.0 lb

## 2020-02-02 DIAGNOSIS — I10 Essential (primary) hypertension: Secondary | ICD-10-CM

## 2020-02-02 DIAGNOSIS — E039 Hypothyroidism, unspecified: Secondary | ICD-10-CM

## 2020-02-02 DIAGNOSIS — E782 Mixed hyperlipidemia: Secondary | ICD-10-CM | POA: Diagnosis not present

## 2020-02-02 DIAGNOSIS — G4733 Obstructive sleep apnea (adult) (pediatric): Secondary | ICD-10-CM | POA: Diagnosis not present

## 2020-02-02 DIAGNOSIS — I2510 Atherosclerotic heart disease of native coronary artery without angina pectoris: Secondary | ICD-10-CM | POA: Diagnosis not present

## 2020-02-02 NOTE — Progress Notes (Signed)
Cardiology Office Note  Date: 02/02/2020   ID: Llewellyn, Schoenberger 06-04-1946, MRN [id number]  PCP:  Mikey Kirschner, MD  Cardiologist:  Rozann Lesches, MD Electrophysiologist:  None   Chief Complaint: F/U CAD s/p CABG, HTN, HLD  History of Present Illness: Lacey Bailey is a 74 y.o. female with a history of CAD status post CABG 2003, hypertension, hyperlipidemia, hypothyroidism, TIA  Last encounter via telemedicine with Dr. Domenic Polite June 13, 2019.  She had been gradually slowing down in terms of stamina and reported fatigue and shortness of breath with ADLs.  She was using occasional nitroglycerin more than once every few weeks.  He discussed with patient there were no good revascularization options and medical management was indicated.  He discussed increasing her Imdur to twice daily and considered possibly Ranexa as another option.  She recently had a sleep study which showed significant sleep apnea.  Dr. Wolfgang Phoenix stated patient needed CPAP machine with AutoPap at 8-15 which would help her sleep and daytime energy.   Patient states he is recently having significant exertional fatigue and has to stop at intervals just to rest.  Husband states she had to stop 3 times walking from the parking lot into the clinic today.  She states at home just washing dishes is an effort.  She states she is experiencing episodes of dizziness but no syncopal episodes but sometimes feels as though she may pass out.  Her blood pressure is low today.  Initial pressure was 98/68 on arrival.  Recheck was 94/62 in left arm.  She states nothing has changed on her medication dosage or frequency.  No new medications.  She does have hypothyroidism and has not had a recent thyroid profile checked.  Her Imdur was increased to 30 mg twice a day previously.  There was talk of possibly adding Ranexa.  She recently started CPAP and has been on it for approximately 2 weeks.  She states she had a hard time wearing the  device to start with but is getting used to it.  States she is starting to sleep longer but still feels significantly sleepy during the day.  She denies any classic anginal symptoms, PND, orthopnea, lower extremity edema.  She has lost around 10 pounds since last visit.    Past Medical History:  Diagnosis Date   Anginal pain ([place])    Aortic sclerosis    CAD (coronary artery disease)    a. s/p CABG in 2003 b. multiple interventions since at outside hospitals c. DES to PLB in 11/2014 d. angioplasty to mid-RCA and mid-LCx in 10/2015 with patent LIMA-LAD and occlusion of all vein grafts e. 12/2017: occlusion of vein grafts and native LCx and RCA with patent LIMA-LAD. Re-do CABG NOT recommended by CT surgery   Depression    Essential hypertension    Hyperlipidemia    Hypothyroidism    Kidney stones    TIA (transient ischemic attack) ~ 2013    Past Surgical History:  Procedure Laterality Date   ABDOMINAL HYSTERECTOMY     APPENDECTOMY     BLEPHAROPLASTY Bilateral    "uppers"   CARDIAC CATHETERIZATION  11/22/2015   CARDIAC CATHETERIZATION N/A 11/22/2015   Procedure: Left Heart Cath and Cors/Grafts Angiography;  Surgeon: Leonie Man, MD;  Location: [facility];  Service: Cardiovascular;  Laterality: N/A;   CARDIAC CATHETERIZATION N/A 11/22/2015   Procedure: Coronary Stent Intervention;  Surgeon: Leonie Man, MD;  Location: [facility];  Service: Cardiovascular;  Laterality: N/A;   CATARACT EXTRACTION, BILATERAL Bilateral    CESAREAN SECTION  1977   CORONARY ANGIOPLASTY WITH STENT PLACEMENT  "several"   "last one was 11/2014:  DES to the posterolateral branch /notes 11/20/2015   CORONARY ARTERY BYPASS GRAFT  2003   [place]; CABG X 4   DILATION AND CURETTAGE OF UTERUS     KIDNEY STONE SURGERY     "opened me up"   LAPAROSCOPIC CHOLECYSTECTOMY     LEFT HEART CATH AND CORS/GRAFTS ANGIOGRAPHY N/A 01/03/2018   Procedure: LEFT HEART CATH AND CORS/GRAFTS  ANGIOGRAPHY;  Surgeon: [name], Peter M, MD;  Location: [facility];  Service: Cardiovascular;  Laterality: N/A;   TONSILLECTOMY     TUBAL LIGATION      Current Outpatient Medications  Medication Sig Dispense Refill   albuterol (PROVENTIL HFA;VENTOLIN HFA) 108 (90 Base) MCG/ACT inhaler Inhale 2 puffs into the lungs every 6 (six) hours as needed for wheezing or shortness of breath. 1 Inhaler 0   aspirin 81 MG tablet Take 81 mg by mouth daily.     gabapentin (NEURONTIN) 100 MG capsule TAKE ONE CAPSULE BY MOUTH THREE TIMES DAILY 90 capsule 5   isosorbide mononitrate (IMDUR) 30 MG 24 hr tablet Take 1 tablet (30 mg total) by mouth 2 (two) times daily. 180 tablet 3   levothyroxine (SYNTHROID) 75 MCG tablet TAKE 1 TABLET BY MOUTH DAILY BEFORE BREAKFAST 90 tablet 1   nitroGLYCERIN (NITROSTAT) 0.4 MG SL tablet DISSOLVE 1 TABLET UNDER THE TONGUE EVERY 5 MINUTES AS NEEDED FOR CHEST PAIN. DO NOT EXCEED A TOTAL OF 3 DOSES IN 15 MINUTES. 25 tablet 3   oxyCODONE-acetaminophen (PERCOCET/ROXICET) 5-325 MG tablet Take 1/2-1 tablet by mouth q 4-6 hours as needed for pain 24 tablet 0   pantoprazole (PROTONIX) 40 MG tablet TAKE 1 TABLET BY MOUTH EVERY DAY 90 tablet 3   PARoxetine (PAXIL) 40 MG tablet Take 1 tablet (40 mg total) by mouth daily. 90 tablet 1   rosuvastatin (CRESTOR) 20 MG tablet TAKE 1 TABLET BY MOUTH DAILY 90 tablet 1   No current facility-administered medications for this visit.   Allergies:  Patient has no known allergies.   Social History: The patient  reports that she has never smoked. She has never used smokeless tobacco. She reports previous alcohol use. She reports that she does not use drugs.   Family History: The patient's family history includes Heart attack in her mother; Heart disease in her brother and mother.   ROS:  Please see the history of present illness. Otherwise, complete review of systems is positive for none.  All other systems are reviewed and negative.     Physical Exam: VS:  BP 98/68    Pulse 80    Ht 5' 2.5" (1.588 m)    Wt 230 lb (104.3 kg)    SpO2 98%    BMI 41.40 kg/m , BMI Body mass index is 41.4 kg/m.  Wt Readings from Last 3 Encounters:  02/02/20 230 lb (104.3 kg)  06/13/19 240 lb (108.9 kg)  01/16/19 220 lb (99.8 kg)    General: Morbidly obese patient appears comfortable at rest. Neck: Supple, no elevated JVP or carotid bruits, no thyromegaly. Lungs: Clear to auscultation, nonlabored breathing at rest. Cardiac: Regular rate and rhythm, no S3 or significant systolic murmur, no pericardial rub. Extremities: No pitting edema, distal pulses 2+. Skin: Warm and dry. Musculoskeletal: No kyphosis. Neuropsychiatric: Alert and oriented x3, affect grossly appropriate.  ECG:  An ECG dated 02/02/2020 was personally reviewed today and demonstrated:  Normal sinus rhythm rate of 82, voltage criteria for LVH, marked ST abnormality, possible lateral subendocardial injury  Recent Labwork: No results found for requested labs within last 8760 hours.     Component Value Date/Time   CHOL 139 11/08/2018 1015   TRIG 194 (H) 11/08/2018 1015   HDL 37 (L) 11/08/2018 1015   CHOLHDL 3.8 11/08/2018 1015   CHOLHDL 6.0 04/20/2017 1100   VLDL 69 (H) 04/20/2017 1100   LDLCALC 63 11/08/2018 1015    Other Studies Reviewed Today:  Cardiac catheterization 01/03/2018:  Post Atrio lesion is 20% stenosed.  Prox RCA to Mid RCA lesion is 100% stenosed.  Prox Cx to Dist Cx lesion is 100% stenosed.  Ost LAD to Prox LAD lesion is 100% stenosed.  LIMA graft was visualized by angiography and is large.  The graft exhibits no disease.  SVG graft was visualized by angiography.  SVG graft was visualized by angiography.  SVG graft was visualized by angiography.  Origin lesion is 100% stenosed.  Origin to Prox Graft lesion is 100% stenosed.  Origin to Prox Graft lesion is 100% stenosed.  The left ventricular systolic function is normal.  LV end  diastolic pressure is normal.  The left ventricular ejection fraction is 55-65% by visual estimate.  1. Severe 3 vessel occlusive CAD 2. Patent LIMA to the LAD 3. Occluded SVG to diagonal 4. Occluded SVG to the OM2 5. Occluded SVG to the PDA 6. Good LV function 7. Normal LVEDP  Plan: Compared to prior cardiac cath in March 2017 the stents in the mid to distal LCx and mid RCA are completely occluded. Given extensive work done on these vessels previously she is not a candidate for CTO PCI.  Echocardiogram 05/19/2017: Study Conclusions  - Left ventricle: The cavity size was normal. Wall thickness was increased in a pattern of moderate LVH. Systolic function was normal. The estimated ejection fraction was in the range of 55% to 60%. Wall motion was normal; there were no regional wall motion abnormalities. Doppler parameters are consistent with abnormal left ventricular relaxation (grade 1 diastolic dysfunction). There was no evidence of elevated ventricular filling pressure by Doppler parameters. - Aortic valve: Mildly calcified annulus. Trileaflet; mildly thickened leaflets. Valve area (VTI): 2.36 cm^2. Valve area (Vmax): 1.98 cm^2. Valve area (Vmean): 2.13 cm^2. - Atrial septum: No defect or patent foramen ovale was identified. - Technically adequate study.  Assessment and Plan:  1. CAD in native artery   2. Essential hypertension   3. Mixed hyperlipidemia   4. Obstructive sleep apnea   5. Hypothyroidism, unspecified type    1. CAD in native artery History of previous CABG x4.  Cardiac catheterization 01/10/2018: Severe three-vessel occlusive disease.  Patent LIMA to LAD, occluded SVG to diagonal, occluded SVG to OM 2, occluded SVG to PDA.  Compared to previous cardiac catheterization in March 2017 stents in the mid to distal LCx and mid RCA completely occluded.  2. Essential hypertension Blood pressure is low today.  Initial blood pressure 98/68.   Repeat blood pressure 94/62.  Stop lisinopril and Toprol for now.  Start measuring blood pressures every day for the next 2 weeks and come back for a nursing visit for blood pressure check and bring blood pressure logs with you.  3. Mixed hyperlipidemia Last lipid panel on 11/08/2018 showed TC of 139, TG 194, HDL 37, LDL 63  4. Obstructive sleep apnea Recent sleep study showed significant  sleep apnea.  She was to be starting on CPAP therapy.  States she has been on the therapy for approximately 2 weeks and seems to be sleeping better.  5.  Hypothyroidism Patient states she is having significant fatigue with and without exertion.  States she can only perform minor activities such as possibly washing dishes or taking a shower work before becoming significantly fatigued and has to lay down to sleep.  States she has not had her thyroid lab work done in a while.  Please order a TSH, T3, T4  Medication Adjustments/Labs and Tests Ordered: Current medicines are reviewed at length with the patient today.  Concerns regarding medicines are outlined above.   Disposition: Follow-up with Dr. Diona Browner or APP 1 month  Signed, Rennis Harding, NP 02/02/2020 1:06 PM    [facility] at [facility] [address] [place], [place], [place] [address] Phone: [phone]; Fax: [phone]

## 2020-02-02 NOTE — Patient Instructions (Addendum)
Your physician recommends that you schedule a follow-up appointment in: 1 MONTH WITH DR MCDOWELL AND 2 WEEKS BLOOD PRESSURE CHECK   Your physician has recommended you make the following change in your medication:   STOP LISINOPRIL  STOP METOPROLOL   Your physician recommends that you return for lab work TSH/T3/T4   Thank you for choosing [facility]!!

## 2020-02-12 DIAGNOSIS — E039 Hypothyroidism, unspecified: Secondary | ICD-10-CM | POA: Diagnosis not present

## 2020-02-16 ENCOUNTER — Telehealth: Payer: Self-pay | Admitting: *Deleted

## 2020-02-16 ENCOUNTER — Ambulatory Visit (INDEPENDENT_AMBULATORY_CARE_PROVIDER_SITE_OTHER): Payer: PPO | Admitting: *Deleted

## 2020-02-16 ENCOUNTER — Other Ambulatory Visit: Payer: Self-pay

## 2020-02-16 VITALS — BP 104/82 | HR 96 | Ht 62.5 in | Wt 230.6 lb

## 2020-02-16 DIAGNOSIS — I10 Essential (primary) hypertension: Secondary | ICD-10-CM | POA: Diagnosis not present

## 2020-02-16 MED ORDER — METOPROLOL SUCCINATE ER 25 MG PO TB24: 12.5000 mg | ORAL_TABLET | Freq: Every day | ORAL | 1 refills | Status: DC

## 2020-02-16 NOTE — Telephone Encounter (Signed)
Per Nena Polio, NP--Have her go back on toprol 12.5 mg for now and continue to measure her BP for the next two weeks and a bring a log in two weeks for repeat nurse visit. Blood pressure is creeping back up. Thanks

## 2020-02-16 NOTE — Progress Notes (Signed)
Presents to office today for nurse BP check per last office note. Home BP readings brought for review and placed on Lacey Bailey's desk. Reports continued dizziness that is unchanged. Denies chest pain or SOB. Has taken all doses of medications as prescribed without side effects. Reports having lab work done recently at [facility]. Results requested.

## 2020-02-16 NOTE — Patient Instructions (Signed)
Continue same plan and follow up °

## 2020-02-16 NOTE — Telephone Encounter (Signed)
Patient informed and verbalized understanding of plan. Has toprol xl 25 mg on hand

## 2020-02-16 NOTE — Progress Notes (Signed)
Have her go back on toprol 12.5 mg for now and continue to measure her BP for the next two weeks and a bring a log in two weeks for repeat nurse visit. Blood pressure is creeping back up. Thanks

## 2020-02-16 NOTE — Telephone Encounter (Signed)
-----   Message from Netta Neat., NP sent at 02/16/2020 12:07 PM EDT ----- Please let the patient know her thyroid function looked good. Thanks

## 2020-02-18 DIAGNOSIS — G4733 Obstructive sleep apnea (adult) (pediatric): Secondary | ICD-10-CM | POA: Diagnosis not present

## 2020-03-12 ENCOUNTER — Ambulatory Visit: Payer: PPO | Admitting: Cardiology

## 2020-03-13 ENCOUNTER — Ambulatory Visit (INDEPENDENT_AMBULATORY_CARE_PROVIDER_SITE_OTHER): Payer: PPO | Admitting: Cardiology

## 2020-03-13 ENCOUNTER — Encounter: Payer: Self-pay | Admitting: Cardiology

## 2020-03-13 VITALS — BP 100/78 | HR 84 | Ht 62.0 in | Wt 223.2 lb

## 2020-03-13 DIAGNOSIS — E782 Mixed hyperlipidemia: Secondary | ICD-10-CM | POA: Diagnosis not present

## 2020-03-13 DIAGNOSIS — I10 Essential (primary) hypertension: Secondary | ICD-10-CM | POA: Diagnosis not present

## 2020-03-13 DIAGNOSIS — I25119 Atherosclerotic heart disease of native coronary artery with unspecified angina pectoris: Secondary | ICD-10-CM | POA: Diagnosis not present

## 2020-03-13 NOTE — Progress Notes (Signed)
Cardiology Office Note  Date: 03/13/2020   ID: Lacey, Bailey August 03, 1946, MRN [id number]  PCP:  Lacey Albert, MD (Inactive)  Cardiologist:  Lacey Dell, MD Electrophysiologist:  None   Chief Complaint  Patient presents with   Cardiac follow-up    History of Present Illness: Lacey Bailey is a 74 y.o. female last seen in June by Mr. Lacey Bailey. She presents today with her husband for a follow-up visit.  She states that within the last 1 to 2 weeks she has started feeling better in terms of exertional symptoms and fatigue.  She has been tolerating CPAP treatment better during this time, also doing a Nutrisystem plan and losing weight.  She is able to walk back and forth from her mailbox, a total of 100 yards.  She seems to be more encouraged.  I did review her follow-up lab work as shown below, normal thyroid function.  We went over her medications today and updated the list.  We went over home blood pressure checks.  She has not had any hypotension.  Past Medical History:  Diagnosis Date   Anginal pain ([facility])    Aortic sclerosis    CAD (coronary artery disease)    a. s/p CABG in 2003 b. multiple interventions since at outside hospitals c. DES to PLB in 11/2014 d. angioplasty to mid-RCA and mid-LCx in 10/2015 with patent LIMA-LAD and occlusion of all vein grafts e. 12/2017: occlusion of vein grafts and native LCx and RCA with patent LIMA-LAD. Re-do CABG NOT recommended by CT surgery   Depression    Essential hypertension    Hyperlipidemia    Hypothyroidism    Kidney stones    TIA (transient ischemic attack) ~ 2013    Past Surgical History:  Procedure Laterality Date   ABDOMINAL HYSTERECTOMY     APPENDECTOMY     BLEPHAROPLASTY Bilateral    "uppers"   CARDIAC CATHETERIZATION  11/22/2015   CARDIAC CATHETERIZATION N/A 11/22/2015   Procedure: Left Heart Cath and Cors/Grafts Angiography;  Surgeon: Lacey Lex, MD;  Location: [facility];   Service: Cardiovascular;  Laterality: N/A;   CARDIAC CATHETERIZATION N/A 11/22/2015   Procedure: Coronary Stent Intervention;  Surgeon: Lacey Lex, MD;  Location: [facility];  Service: Cardiovascular;  Laterality: N/A;   CATARACT EXTRACTION, BILATERAL Bilateral    CESAREAN SECTION  1977   CORONARY ANGIOPLASTY WITH STENT PLACEMENT  "several"   "last one was 11/2014:  DES to the posterolateral branch /notes 11/20/2015   CORONARY ARTERY BYPASS GRAFT  2003   [place]; CABG X 4   DILATION AND CURETTAGE OF UTERUS     KIDNEY STONE SURGERY     "opened me up"   LAPAROSCOPIC CHOLECYSTECTOMY     LEFT HEART CATH AND CORS/GRAFTS ANGIOGRAPHY N/A 01/03/2018   Procedure: LEFT HEART CATH AND CORS/GRAFTS ANGIOGRAPHY;  Surgeon: Lacey, Peter M, MD;  Location: [facility];  Service: Cardiovascular;  Laterality: N/A;   TONSILLECTOMY     TUBAL LIGATION      Current Outpatient Medications  Medication Sig Dispense Refill   aspirin 81 MG tablet Take 81 mg by mouth at bedtime.      isosorbide mononitrate (IMDUR) 30 MG 24 hr tablet Take 1 tablet (30 mg total) by mouth 2 (two) times daily. 180 tablet 3   levothyroxine (SYNTHROID) 75 MCG tablet TAKE 1 TABLET BY MOUTH DAILY BEFORE BREAKFAST 90 tablet 1   lisinopril (ZESTRIL) 5 MG tablet  Take 5 mg by mouth daily.     nitroGLYCERIN (NITROSTAT) 0.4 MG SL tablet DISSOLVE 1 TABLET UNDER THE TONGUE EVERY 5 MINUTES AS NEEDED FOR CHEST PAIN. DO NOT EXCEED A TOTAL OF 3 DOSES IN 15 MINUTES. 25 tablet 3   oxyCODONE-acetaminophen (PERCOCET/ROXICET) 5-325 MG tablet Take 1/2-1 tablet by mouth q 4-6 hours as needed for pain 24 tablet 0   PARoxetine (PAXIL) 40 MG tablet Take 40 mg by mouth at bedtime.     rosuvastatin (CRESTOR) 20 MG tablet Take 20 mg by mouth in the morning.     solifenacin (VESICARE) 10 MG tablet Take 5 mg by mouth at bedtime.     No current facility-administered medications for this visit.   Allergies:  Patient has no known  allergies.   ROS:   No palpitations or syncope.  Physical Exam: VS:  BP 100/78    Pulse 84    Ht 5\' 2"  (1.575 Bailey)    Wt 223 lb 3.2 oz (101.2 kg)    SpO2 96%    BMI 40.82 kg/Bailey , BMI Body mass index is 40.82 kg/Bailey.  Wt Readings from Last 3 Encounters:  03/13/20 223 lb 3.2 oz (101.2 kg)  02/16/20 230 lb 9.6 oz (104.6 kg)  02/02/20 230 lb (104.3 kg)    General: Patient appears comfortable at rest. HEENT: Conjunctiva and lids normal, oropharynx clear with moist mucosa. Neck: Supple, no elevated JVP or carotid bruits, no thyromegaly. Lungs: Clear to auscultation, nonlabored breathing at rest. Cardiac: Regular rate and rhythm, no S3, soft systolic murmur, no pericardial rub. Extremities: No pitting edema, distal pulses 2+.  ECG:  An ECG dated 02/02/2020 was personally reviewed today and demonstrated:  Sinus rhythm with LVH and repolarization abnormalities.  Recent Labwork:    Component Value Date/Time   CHOL 139 11/08/2018 1015   TRIG 194 (H) 11/08/2018 1015   HDL 37 (L) 11/08/2018 1015   CHOLHDL 3.8 11/08/2018 1015   CHOLHDL 6.0 04/20/2017 1100   VLDL 69 (H) 04/20/2017 1100   LDLCALC 63 11/08/2018 1015  June 2021: TSH 2.89, free T3 2.3, free T4 1.39  Other Studies Reviewed Today:  Cardiac catheterization 01/03/2018:  Post Atrio lesion is 20% stenosed.  Prox RCA to Mid RCA lesion is 100% stenosed.  Prox Cx to Dist Cx lesion is 100% stenosed.  Ost LAD to Prox LAD lesion is 100% stenosed.  LIMA graft was visualized by angiography and is large.  The graft exhibits no disease.  SVG graft was visualized by angiography.  SVG graft was visualized by angiography.  SVG graft was visualized by angiography.  Origin lesion is 100% stenosed.  Origin to Prox Graft lesion is 100% stenosed.  Origin to Prox Graft lesion is 100% stenosed.  The left ventricular systolic function is normal.  LV end diastolic pressure is normal.  The left ventricular ejection fraction is 55-65% by  visual estimate.  1. Severe 3 vessel occlusive CAD 2. Patent LIMA to the LAD 3. Occluded SVG to diagonal 4. Occluded SVG to the OM2 5. Occluded SVG to the PDA 6. Good LV function 7. Normal LVEDP  Plan: Compared to prior cardiac cath in March 2017 the stents in the mid to distal LCx and mid RCA are completely occluded. Given extensive work done on these vessels previously she is not a candidate for CTO PCI.  Echocardiogram 05/19/2017: Study Conclusions  - Left ventricle: The cavity size was normal. Wall thickness was increased in a pattern of moderate LVH. Systolic  function was normal. The estimated ejection fraction was in the range of 55% to 60%. Wall motion was normal; there were no regional wall motion abnormalities. Doppler parameters are consistent with abnormal left ventricular relaxation (grade 1 diastolic dysfunction). There was no evidence of elevated ventricular filling pressure by Doppler parameters. - Aortic valve: Mildly calcified annulus. Trileaflet; mildly thickened leaflets. Valve area (VTI): 2.36 cm^2. Valve area (Vmax): 1.98 cm^2. Valve area (Vmean): 2.13 cm^2. - Atrial septum: No defect or patent foramen ovale was identified. - Technically adequate study.  Assessment and Plan:  1.  Multivessel CAD status post CABG with documented graft disease.  Cardiac catheterization in May 2019 revealed all vein grafts are occluded with patent LIMA to LAD.  Stent sites within the mid distal circumflex and mid RCA are also occluded and revascularization options are quite limited.  We will continue with medical therapy which she is tolerating at present.  She has lost weight with diet and is trying to increase her activity as well.  Continue observation.  2.  Mixed hyperlipidemia, she remains on Crestor.  Last LDL 63.  3.  Essential hypertension, blood pressure is low normal today, she is asymptomatic.  No changes made to current regimen.  Medication  Adjustments/Labs and Tests Ordered: Current medicines are reviewed at length with the patient today.  Concerns regarding medicines are outlined above.   Tests Ordered: No orders of the defined types were placed in this encounter.   Medication Changes: No orders of the defined types were placed in this encounter.   Disposition:  Follow up 3 months in the [place] office.  Signed, Jonelle Sidle, MD, [facility] 03/13/2020 3:41 PM    [facility] at [facility] [address] [place], [place], [place] [address] Phone: [phone]; Fax: [phone]

## 2020-03-13 NOTE — Patient Instructions (Addendum)
Medication Instructions:   Your physician recommends that you continue on your current medications as directed. Please refer to the Current Medication list given to you today.  Labwork:  NONE  Testing/Procedures:  NONE  Follow-Up:  Your physician recommends that you schedule a follow-up appointment in: 3 months (office).  Any Other Special Instructions Will Be Listed Below (If Applicable).  If you need a refill on your cardiac medications before your next appointment, please call your pharmacy. 

## 2020-03-19 DIAGNOSIS — G4733 Obstructive sleep apnea (adult) (pediatric): Secondary | ICD-10-CM | POA: Diagnosis not present

## 2020-03-22 DIAGNOSIS — G4733 Obstructive sleep apnea (adult) (pediatric): Secondary | ICD-10-CM | POA: Diagnosis not present

## 2020-04-05 ENCOUNTER — Other Ambulatory Visit: Payer: Self-pay | Admitting: Family Medicine

## 2020-04-05 NOTE — Telephone Encounter (Signed)
Please schedule a visit and then route back to nurses 

## 2020-04-05 NOTE — Telephone Encounter (Signed)
Scheduled 9/1.

## 2020-04-08 NOTE — Telephone Encounter (Signed)
Pt is scheduled for 9/1

## 2020-04-19 DIAGNOSIS — G4733 Obstructive sleep apnea (adult) (pediatric): Secondary | ICD-10-CM | POA: Diagnosis not present

## 2020-04-24 ENCOUNTER — Telehealth: Payer: Self-pay | Admitting: Family Medicine

## 2020-04-24 ENCOUNTER — Encounter: Payer: Self-pay | Admitting: Family Medicine

## 2020-04-24 ENCOUNTER — Ambulatory Visit (INDEPENDENT_AMBULATORY_CARE_PROVIDER_SITE_OTHER): Payer: PPO | Admitting: Family Medicine

## 2020-04-24 ENCOUNTER — Other Ambulatory Visit: Payer: Self-pay

## 2020-04-24 VITALS — BP 118/82 | HR 86 | Temp 97.3°F | Ht 62.5 in | Wt 216.0 lb

## 2020-04-24 DIAGNOSIS — I2510 Atherosclerotic heart disease of native coronary artery without angina pectoris: Secondary | ICD-10-CM | POA: Diagnosis not present

## 2020-04-24 DIAGNOSIS — R5383 Other fatigue: Secondary | ICD-10-CM | POA: Diagnosis not present

## 2020-04-24 DIAGNOSIS — E039 Hypothyroidism, unspecified: Secondary | ICD-10-CM | POA: Diagnosis not present

## 2020-04-24 DIAGNOSIS — Z9861 Coronary angioplasty status: Secondary | ICD-10-CM

## 2020-04-24 DIAGNOSIS — G4730 Sleep apnea, unspecified: Secondary | ICD-10-CM

## 2020-04-24 DIAGNOSIS — I25709 Atherosclerosis of coronary artery bypass graft(s), unspecified, with unspecified angina pectoris: Secondary | ICD-10-CM | POA: Diagnosis not present

## 2020-04-24 DIAGNOSIS — I10 Essential (primary) hypertension: Secondary | ICD-10-CM | POA: Diagnosis not present

## 2020-04-24 MED ORDER — PAROXETINE HCL 40 MG PO TABS: 40.0000 mg | ORAL_TABLET | Freq: Every day | ORAL | 1 refills | Status: DC

## 2020-04-24 NOTE — Progress Notes (Signed)
   Patient ID: Lacey Bailey, female    DOB: 1945-11-14, 74 y.o.   MRN: [id number]   Chief Complaint  Patient presents with  . Sleep Apnea   Subjective:    HPIsleep apnea. Pt states she needed to come in for insurance to cover cpap supplies. States she is doing well with it.    Medical History Lacey Bailey has a past medical history of Anginal pain ([facility]), Aortic sclerosis, CAD (coronary artery disease), Depression, Essential hypertension, Hyperlipidemia, Hypothyroidism, Kidney stones, and TIA (transient ischemic attack) (~ 2013).   Outpatient Encounter Medications as of 04/24/2020  Medication Sig  . aspirin 81 MG tablet Take 81 mg by mouth at bedtime.   . isosorbide mononitrate (IMDUR) 30 MG 24 hr tablet Take 1 tablet (30 mg total) by mouth 2 (two) times daily.  Marland Kitchen levothyroxine (SYNTHROID) 75 MCG tablet TAKE 1 TABLET BY MOUTH DAILY BEFORE BREAKFAST  . lisinopril (ZESTRIL) 5 MG tablet Take 5 mg by mouth daily.  . nitroGLYCERIN (NITROSTAT) 0.4 MG SL tablet DISSOLVE 1 TABLET UNDER THE TONGUE EVERY 5 MINUTES AS NEEDED FOR CHEST PAIN. DO NOT EXCEED A TOTAL OF 3 DOSES IN 15 MINUTES.  Marland Kitchen oxyCODONE-acetaminophen (PERCOCET/ROXICET) 5-325 MG tablet Take 1/2-1 tablet by mouth q 4-6 hours as needed for pain  . PARoxetine (PAXIL) 40 MG tablet TAKE 1 TABLET BY MOUTH DAILY  . rosuvastatin (CRESTOR) 20 MG tablet Take 20 mg by mouth in the morning.  . solifenacin (VESICARE) 10 MG tablet Take 5 mg by mouth at bedtime.   No facility-administered encounter medications on file as of 04/24/2020.     Review of Systems   Vitals There were no vitals taken for this visit.  Objective:   Physical Exam   Assessment and Plan   There are no diagnoses linked to this encounter.     04/24/2020

## 2020-04-24 NOTE — Telephone Encounter (Signed)
Called number and erica no longer works there but [facility] Mortimer is over cpap supplies and she said all they needed was office note discussing cpap. I printed the note and faxed to [place] [employer] [employer] fax number given was [phone].

## 2020-04-24 NOTE — Telephone Encounter (Signed)
Ok thx. Dr. Shamariah Shewmake  

## 2020-04-24 NOTE — Progress Notes (Signed)
Patient ID: Lacey Bailey, female    DOB: 1945/11/17, 74 y.o.   MRN: [id number]   Chief Complaint  Patient presents with  . Sleep Apnea   Subjective:    HPI Pt stating that she had a recent sleep apnea test and started on the cpap machine a couple months ago. Pt stating has been compliant with the machine nightly.  From notes in 10/20- pt discussed with Dr. Wolfgang Phoenix possible sleep apnea.  Feeling fatigued all the time.  In the past, before the cpap- Small task getting very fatigued after showering. Husband noticing snoring in the past. Napping during the day easily before using the machine now since getting the machine a few months ago. Using it  Nightly. Husband was noticing some intermittent apnea thought the night. Sleeping in till noon. Has h/o CAD, HTN, HLD, and obesity.  Seeing cards also, about every 19mo Feeling better on cpap.  Not having napping during day, foggines, or fatigue during the day.   Medical History J[place]arnik[place] a past medical history of [place], Aortic sclerosis, CAD (coronary artery disease), Depression, Essential hypertension, Hyperlipidemia, Hypothyroidism, Kidney stones, and TIA (transient ischemic attack) (~ 2013).   Outpatient Encounter Medications as of 04/24/2020  Medication Sig  . aspirin 81 MG tablet Take 81 mg by mouth at bedtime.   . isosorbide mononitrate (IMDUR) 30 MG 24 hr tablet Take 1 tablet (30 mg total) by mouth 2 (two) times daily.  .Marland Kitchenlevothyroxine (SYNTHROID) 75 MCG tablet TAKE 1 TABLET BY MOUTH DAILY BEFORE BREAKFAST  . lisinopril (ZESTRIL) 5 MG tablet Take 5 mg by mouth daily.  . Melatonin-Pyridoxine (MELATIN PO) Take by mouth.  . nitroGLYCERIN (NITROSTAT) 0.4 MG SL tablet DISSOLVE 1 TABLET UNDER THE TONGUE EVERY 5 MINUTES AS NEEDED FOR CHEST PAIN. DO NOT EXCEED A TOTAL OF 3 DOSES IN 15 MINUTES.  .Marland KitchenPARoxetine (PAXIL) 40 MG tablet Take 1 tablet (40 mg total) by mouth daily.  . rosuvastatin (CRESTOR) 20 MG tablet Take 20 mg by  mouth in the morning.  . solifenacin (VESICARE) 10 MG tablet Take 5 mg by mouth at bedtime.  . [DISCONTINUED] PARoxetine (PAXIL) 40 MG tablet TAKE 1 TABLET BY MOUTH DAILY  . [DISCONTINUED] oxyCODONE-acetaminophen (PERCOCET/ROXICET) 5-325 MG tablet Take 1/2-1 tablet by mouth q 4-6 hours as needed for pain   No facility-administered encounter medications on file as of 04/24/2020.     Review of Systems  Constitutional: Negative for chills and fever.  HENT: Negative for congestion, rhinorrhea and sore throat.   Respiratory: Negative for cough, shortness of breath and wheezing.   Cardiovascular: Negative for chest pain and leg swelling.  Gastrointestinal: Negative for abdominal pain, diarrhea, nausea and vomiting.  Genitourinary: Negative for dysuria and frequency.  Musculoskeletal: Negative for arthralgias and back pain.  Skin: Negative for rash.  Neurological: Negative for dizziness, weakness and headaches.  Psychiatric/Behavioral: Negative for sleep disturbance.    Vitals BP 118/82   Pulse 86   Temp (!) 97.3 F (36.3 C)   Ht 5' 2.5" (1.588 m)   Wt 216 lb (98 kg)   SpO2 96%   BMI 38.88 kg/m   Objective:   Physical Exam Vitals and nursing note reviewed.  Constitutional:      Appearance: Normal appearance.  HENT:     Head: Normocephalic and atraumatic.     Nose: Nose normal.     Mouth/Throat:     Mouth: Mucous membranes are moist.     Pharynx: Oropharynx is  clear.  Eyes:     Extraocular Movements: Extraocular movements intact.     Conjunctiva/sclera: Conjunctivae normal.     Pupils: Pupils are equal, round, and reactive to light.  Cardiovascular:     Rate and Rhythm: Normal rate and regular rhythm.     Pulses: Normal pulses.     Heart sounds: Normal heart sounds. No murmur heard.   Pulmonary:     Effort: Pulmonary effort is normal. No respiratory distress.     Breath sounds: Normal breath sounds. No wheezing, rhonchi or rales.  Musculoskeletal:        General:  Normal range of motion.     Right lower leg: No edema.     Left lower leg: No edema.  Skin:    General: Skin is warm and dry.     Findings: No lesion or rash.  Neurological:     General: No focal deficit present.     Mental Status: She is alert and oriented to person, place, and time.  Psychiatric:        Mood and Affect: Mood normal.        Behavior: Behavior normal.      Assessment and Plan   1. Sleep apnea, unspecified type  2. CAD S/P percutaneous coronary angioplasty - CBC - CMP14+EGFR - Lipid panel  3. Atherosclerosis of coronary artery bypass graft of native heart with angina pectoris ([place])  4. Essential hypertension  5. Other fatigue - TSH  6. Hypothyroidism, unspecified type - TSH   Sleep apnea- stable, improved. Fatigue and grogginess is improved.  Snoring has improved.  Pt needing info sent to pharmacy regarding the cpap machine.  [facility]- [phone], [facility] cp[facility]. Will call her to see what is needed for the notes regarding her use of the cpap machine.  htn- stable, cont meds. Hypothyroid-stable, ordered labs.  Cont meds. CAD- f/u cards, stable.  F/u [place] prn.

## 2020-05-17 ENCOUNTER — Other Ambulatory Visit: Payer: Self-pay | Admitting: Urology

## 2020-05-17 ENCOUNTER — Other Ambulatory Visit: Payer: Self-pay

## 2020-05-17 DIAGNOSIS — E039 Hypothyroidism, unspecified: Secondary | ICD-10-CM | POA: Diagnosis not present

## 2020-05-17 DIAGNOSIS — R5383 Other fatigue: Secondary | ICD-10-CM | POA: Diagnosis not present

## 2020-05-17 DIAGNOSIS — Z9861 Coronary angioplasty status: Secondary | ICD-10-CM | POA: Diagnosis not present

## 2020-05-17 DIAGNOSIS — I2510 Atherosclerotic heart disease of native coronary artery without angina pectoris: Secondary | ICD-10-CM | POA: Diagnosis not present

## 2020-05-17 MED ORDER — SOLIFENACIN SUCCINATE 10 MG PO TABS
5.0000 mg | ORAL_TABLET | Freq: Every day | ORAL | 11 refills | Status: DC
Start: 2020-05-17 — End: 2021-05-26

## 2020-05-18 ENCOUNTER — Telehealth: Payer: Self-pay | Admitting: Family Medicine

## 2020-05-18 LAB — CBC
Hematocrit: 41.3 % (ref 34.0–46.6)
Hemoglobin: 13.9 g/dL (ref 11.1–15.9)
MCH: 30.5 pg (ref 26.6–33.0)
MCHC: 33.7 g/dL (ref 31.5–35.7)
MCV: 91 fL (ref 79–97)
Platelets: 321 10*3/uL (ref 150–450)
RBC: 4.55 x10E6/uL (ref 3.77–5.28)
RDW: 14.2 % (ref 11.7–15.4)
WBC: 6.7 10*3/uL (ref 3.4–10.8)

## 2020-05-18 LAB — LIPID PANEL
Chol/HDL Ratio: 3.4 ratio (ref 0.0–4.4)
Cholesterol, Total: 127 mg/dL (ref 100–199)
HDL: 37 mg/dL — ABNORMAL LOW (ref >39)
LDL Chol Calc (NIH): 63 mg/dL (ref 0–99)
Triglycerides: 157 mg/dL — ABNORMAL HIGH (ref 0–149)
VLDL Cholesterol Cal: 27 mg/dL (ref 5–40)

## 2020-05-18 LAB — CMP14+EGFR
ALT: 13 IU/L (ref 0–32)
AST: 15 IU/L (ref 0–40)
Albumin/Globulin Ratio: 2 (ref 1.2–2.2)
Albumin: 4.7 g/dL (ref 3.7–4.7)
Alkaline Phosphatase: 78 IU/L (ref 44–121)
BUN/Creatinine Ratio: 12 (ref 12–28)
BUN: 16 mg/dL (ref 8–27)
Bilirubin Total: 1 mg/dL (ref 0.0–1.2)
CO2: 20 mmol/L (ref 20–29)
Calcium: 9.9 mg/dL (ref 8.7–10.3)
Chloride: 106 mmol/L (ref 96–106)
Creatinine, Ser: 1.36 mg/dL — ABNORMAL HIGH (ref 0.57–1.00)
GFR calc Af Amer: 44 mL/min/{1.73_m2} — ABNORMAL LOW (ref >59)
GFR calc non Af Amer: 38 mL/min/{1.73_m2} — ABNORMAL LOW (ref >59)
Globulin, Total: 2.3 g/dL (ref 1.5–4.5)
Glucose: 99 mg/dL (ref 65–99)
Potassium: 4.2 mmol/L (ref 3.5–5.2)
Sodium: 141 mmol/L (ref 134–144)
Total Protein: 7 g/dL (ref 6.0–8.5)

## 2020-05-18 LAB — TSH: TSH: 2.46 u[IU]/mL (ref 0.450–4.500)

## 2020-05-18 MED ORDER — LEVOTHYROXINE SODIUM 75 MCG PO TABS
ORAL_TABLET | ORAL | 1 refills | Status: DC
Start: 2020-05-18 — End: 2020-10-25

## 2020-05-18 NOTE — Telephone Encounter (Signed)
Refilled thyroid medication. Dr. Ladona Ridgel

## 2020-05-20 DIAGNOSIS — G4733 Obstructive sleep apnea (adult) (pediatric): Secondary | ICD-10-CM | POA: Diagnosis not present

## 2020-06-03 ENCOUNTER — Other Ambulatory Visit: Payer: Self-pay | Admitting: Cardiology

## 2020-06-03 ENCOUNTER — Other Ambulatory Visit: Payer: Self-pay | Admitting: *Deleted

## 2020-06-03 NOTE — Telephone Encounter (Signed)
Refill request from [facility]. It doesn't look like we have prescribed this in the past.

## 2020-06-04 MED ORDER — ROSUVASTATIN CALCIUM 20 MG PO TABS
20.0000 mg | ORAL_TABLET | Freq: Every morning | ORAL | 1 refills | Status: DC
Start: 2020-06-04 — End: 2020-11-04

## 2020-06-19 DIAGNOSIS — G4733 Obstructive sleep apnea (adult) (pediatric): Secondary | ICD-10-CM | POA: Diagnosis not present

## 2020-06-21 ENCOUNTER — Other Ambulatory Visit (HOSPITAL_COMMUNITY): Payer: Self-pay | Admitting: Family Medicine

## 2020-06-21 DIAGNOSIS — Z1231 Encounter for screening mammogram for malignant neoplasm of breast: Secondary | ICD-10-CM

## 2020-06-24 ENCOUNTER — Encounter: Payer: Self-pay | Admitting: Cardiology

## 2020-06-24 ENCOUNTER — Ambulatory Visit: Payer: PPO | Admitting: Cardiology

## 2020-06-24 VITALS — BP 134/94 | HR 75 | Ht 62.0 in | Wt 212.0 lb

## 2020-06-24 DIAGNOSIS — E782 Mixed hyperlipidemia: Secondary | ICD-10-CM

## 2020-06-24 DIAGNOSIS — I25119 Atherosclerotic heart disease of native coronary artery with unspecified angina pectoris: Secondary | ICD-10-CM

## 2020-06-24 NOTE — Progress Notes (Signed)
Cardiology Office Note  Date: 06/24/2020   ID: Carloyn, Lahue August 24, 1946, MRN [id number]  PCP:  Annalee Genta, DO  Cardiologist:  Nona Dell, MD Electrophysiologist:  None   Chief Complaint  Patient presents with  . Cardiac follow-up    History of Present Illness: Lacey Bailey is a 74 y.o. female last seen in July.  She presents for a routine visit.  Reports no progressive angina symptoms at this time, still doing very well since being on CPAP.  She does have good days and bad days in terms of energy level.  I went over her medications today.  She reports compliance with therapy, no obvious intolerances.  She does plan [place] to see extended family and grandchildren.  She will be flying.  She has had the COVID-19 booster. Also plans to get the influenza vaccine.  Past Medical History:  Diagnosis Date  . Anginal pain (HCC)   . Aortic sclerosis   . CAD (coronary artery disease)    a. s/p CABG in 2003 b. multiple interventions since at outside hospitals c. DES to PLB in 11/2014 d. angioplasty to mid-RCA and mid-LCx in 10/2015 with patent LIMA-LAD and occlusion of all vein grafts e. 12/2017: occlusion of vein grafts and native LCx and RCA with patent LIMA-LAD. Re-do CABG NOT recommended by CT surgery  . Depression   . Essential hypertension   . Hyperlipidemia   . Hypothyroidism   . Kidney stones   . TIA (transient ischemic attack) ~ 2013    Past Surgical History:  Procedure Laterality Date  . ABDOMINAL HYSTERECTOMY    . APPENDECTOMY    . BLEPHAROPLASTY Bilateral    "uppers"  . CARDIAC CATHETERIZATION  11/22/2015  . CARDIAC CATHETERIZATION N/A 11/22/2015   Procedure: Left Heart Cath and Cors/Grafts Angiography;  Surgeon: Marykay Lex, MD;  Location: [facility] INVASIVE CV LAB;  Service: Cardiovascular;  Laterality: N/A;  . CARDIAC CATHETERIZATION N/A 11/22/2015   Procedure: Coronary Stent Intervention;  Surgeon: Marykay Lex, MD;  Location: [facility]  INVASIVE CV LAB;  Service: Cardiovascular;  Laterality: N/A;  . CATARACT EXTRACTION, BILATERAL Bilateral   . CESAREAN SECTION  1977  . CORONARY ANGIOPLASTY WITH STENT PLACEMENT  "several"   "last one was 11/2014:  DES to the posterolateral branch /notes 11/20/2015  . CORONARY ARTERY BYPASS GRAFT  2003   [place]; CABG X 4  . DILATION AND CURETTAGE OF UTERUS    . KIDNEY STONE SURGERY     "opened me up"  . LAPAROSCOPIC CHOLECYSTECTOMY    . LEFT HEART CATH AND CORS/GRAFTS ANGIOGRAPHY N/A 01/03/2018   Procedure: LEFT HEART CATH AND CORS/GRAFTS ANGIOGRAPHY;  Surgeon: [name], Peter M, MD;  Location: [facility] INVASIVE CV LAB;  Service: Cardiovascular;  Laterality: N/A;  . TONSILLECTOMY    . TUBAL LIGATION      Current Outpatient Medications  Medication Sig Dispense Refill  . aspirin 81 MG tablet Take 81 mg by mouth at bedtime.     . isosorbide mononitrate (IMDUR) 30 MG 24 hr tablet TAKE 1 TABLET BY MOUTH TWICE DAILY 180 tablet 3  . levothyroxine (SYNTHROID) 75 MCG tablet TAKE 1 TABLET BY MOUTH DAILY BEFORE BREAKFAST 90 tablet 1  . lisinopril (ZESTRIL) 5 MG tablet Take 5 mg by mouth daily.    . Melatonin-Pyridoxine (MELATIN PO) Take by mouth.    . nitroGLYCERIN (NITROSTAT) 0.4 MG SL tablet DISSOLVE 1 TABLET UNDER THE TONGUE EVERY 5 MINUTES AS NEEDED FOR CHEST  PAIN. DO NOT EXCEED A TOTAL OF 3 DOSES IN 15 MINUTES. 25 tablet 3  . PARoxetine (PAXIL) 40 MG tablet Take 1 tablet (40 mg total) by mouth daily. 90 tablet 1  . rosuvastatin (CRESTOR) 20 MG tablet Take 1 tablet (20 mg total) by mouth in the morning. 90 tablet 1  . solifenacin (VESICARE) 10 MG tablet Take 0.5 tablets (5 mg total) by mouth at bedtime. 30 tablet 11   No current facility-administered medications for this visit.   Allergies:  Patient has no known allergies.   ROS: No palpitations or syncope.  Physical Exam: VS:  BP (!) 134/94   Pulse 75   Ht 5\' 2"  (1.575 m)   Wt 212 lb (96.2 kg)   SpO2 98%   BMI 38.78 kg/m , BMI Body mass  index is 38.78 kg/m.  Wt Readings from Last 3 Encounters:  06/24/20 212 lb (96.2 kg)  04/24/20 216 lb (98 kg)  03/13/20 223 lb 3.2 oz (101.2 kg)    General: Patient appears comfortable at rest. HEENT: Conjunctiva and lids normal, wearing a mask. Neck: Supple, no elevated JVP or carotid bruits, no thyromegaly. Lungs: Clear to auscultation, nonlabored breathing at rest. Cardiac: Regular rate and rhythm, no S3, soft systolic murmur, no pericardial rub. Extremities: No pitting edema, distal pulses 2+.  ECG:  An ECG dated 02/02/2020 was personally reviewed today and demonstrated:  Sinus rhythm with LVH and repolarization abnormalities.  Recent Labwork: 05/17/2020: ALT 13; AST 15; BUN 16; Creatinine, Ser 1.36; Hemoglobin 13.9; Platelets 321; Potassium 4.2; Sodium 141; TSH 2.460     Component Value Date/Time   CHOL 127 05/17/2020 1113   TRIG 157 (H) 05/17/2020 1113   HDL 37 (L) 05/17/2020 1113   CHOLHDL 3.4 05/17/2020 1113   CHOLHDL 6.0 04/20/2017 1100   VLDL 69 (H) 04/20/2017 1100   LDLCALC 63 05/17/2020 1113    Other Studies Reviewed Today:  Cardiac catheterization 01/03/2018:  Post Atrio lesion is 20% stenosed.  Prox RCA to Mid RCA lesion is 100% stenosed.  Prox Cx to Dist Cx lesion is 100% stenosed.  Ost LAD to Prox LAD lesion is 100% stenosed.  LIMA graft was visualized by angiography and is large.  The graft exhibits no disease.  SVG graft was visualized by angiography.  SVG graft was visualized by angiography.  SVG graft was visualized by angiography.  Origin lesion is 100% stenosed.  Origin to Prox Graft lesion is 100% stenosed.  Origin to Prox Graft lesion is 100% stenosed.  The left ventricular systolic function is normal.  LV end diastolic pressure is normal.  The left ventricular ejection fraction is 55-65% by visual estimate.  1. Severe 3 vessel occlusive CAD 2. Patent LIMA to the LAD 3. Occluded SVG to diagonal 4. Occluded SVG to the OM2 5.  Occluded SVG to the PDA 6. Good LV function 7. Normal LVEDP  Plan: Compared to prior cardiac cath in March 2017 the stents in the mid to distal LCx and mid RCA are completely occluded. Given extensive work done on these vessels previously she is not a candidate for CTO PCI.  Echocardiogram 05/19/2017: Study Conclusions  - Left ventricle: The cavity size was normal. Wall thickness was increased in a pattern of moderate LVH. Systolic function was normal. The estimated ejection fraction was in the range of 55% to 60%. Wall motion was normal; there were no regional wall motion abnormalities. Doppler parameters are consistent with abnormal left ventricular relaxation (grade 1 diastolic dysfunction). There  was no evidence of elevated ventricular filling pressure by Doppler parameters. - Aortic valve: Mildly calcified annulus. Trileaflet; mildly thickened leaflets. Valve area (VTI): 2.36 cm^2. Valve area (Vmax): 1.98 cm^2. Valve area (Vmean): 2.13 cm^2. - Atrial septum: No defect or patent foramen ovale was identified. - Technically adequate study.  Assessment and Plan:  1.  Multivessel CAD status post CABG.  She has subsequently documented graft disease. Cardiac catheterization in May 2019 revealed all vein grafts are occluded with patent LIMA to LAD.  Stent sites within the mid distal circumflex and mid RCA are also occluded and revascularization options are quite limited.  Fortunately, she remains stable on medical therapy without accelerating angina symptoms.  Continue aspirin, Imdur, lisinopril, and Crestor.  She has as needed nitroglycerin available.  2.  Mixed hyperlipidemia, on Crestor.  Recent LDL 63.  Medication Adjustments/Labs and Tests Ordered: Current medicines are reviewed at length with the patient today.  Concerns regarding medicines are outlined above.   Tests Ordered: No orders of the defined types were placed in this encounter.   Medication  Changes: No orders of the defined types were placed in this encounter.   Disposition:  Follow up 3 months in the [place] office.  Signed, Jonelle Sidle, MD, [facility] 06/24/2020 4:46 PM    [facility] at [facility] [address] [place], [place], [place] [address] Phone: [phone]; Fax: [phone]

## 2020-06-24 NOTE — Patient Instructions (Signed)
Your physician recommends that you schedule a follow-up appointment in: 3 MONTHS WITH DR. MCDOWELL  Your physician recommends that you continue on your current medications as directed. Please refer to the Current Medication list given to you today.  Thank you for choosing [facility]!!    

## 2020-06-28 ENCOUNTER — Other Ambulatory Visit: Payer: Self-pay | Admitting: Cardiology

## 2020-07-20 DIAGNOSIS — G4733 Obstructive sleep apnea (adult) (pediatric): Secondary | ICD-10-CM | POA: Diagnosis not present

## 2020-07-26 ENCOUNTER — Ambulatory Visit (HOSPITAL_COMMUNITY): Payer: PPO

## 2020-08-01 ENCOUNTER — Other Ambulatory Visit: Payer: Self-pay

## 2020-08-01 ENCOUNTER — Ambulatory Visit (HOSPITAL_COMMUNITY)
Admission: RE | Admit: 2020-08-01 | Discharge: 2020-08-01 | Disposition: A | Discharge status: Home / Self Care | Payer: PPO | Source: Ambulatory Visit | Attending: Family Medicine | Admitting: Family Medicine

## 2020-08-01 DIAGNOSIS — Z1231 Encounter for screening mammogram for malignant neoplasm of breast: Secondary | ICD-10-CM | POA: Diagnosis not present

## 2020-08-19 DIAGNOSIS — G4733 Obstructive sleep apnea (adult) (pediatric): Secondary | ICD-10-CM | POA: Diagnosis not present

## 2020-08-26 ENCOUNTER — Other Ambulatory Visit: Payer: Self-pay | Admitting: Cardiology

## 2020-09-19 DIAGNOSIS — G4733 Obstructive sleep apnea (adult) (pediatric): Secondary | ICD-10-CM | POA: Diagnosis not present

## 2020-10-03 ENCOUNTER — Ambulatory Visit (INDEPENDENT_AMBULATORY_CARE_PROVIDER_SITE_OTHER): Payer: PPO | Admitting: Cardiology

## 2020-10-03 ENCOUNTER — Encounter: Payer: Self-pay | Admitting: Cardiology

## 2020-10-03 VITALS — BP 124/76 | HR 80 | Ht 62.5 in | Wt 209.0 lb

## 2020-10-03 DIAGNOSIS — Z9989 Dependence on other enabling machines and devices: Secondary | ICD-10-CM

## 2020-10-03 DIAGNOSIS — G4733 Obstructive sleep apnea (adult) (pediatric): Secondary | ICD-10-CM | POA: Diagnosis not present

## 2020-10-03 DIAGNOSIS — I25119 Atherosclerotic heart disease of native coronary artery with unspecified angina pectoris: Secondary | ICD-10-CM | POA: Diagnosis not present

## 2020-10-03 DIAGNOSIS — E782 Mixed hyperlipidemia: Secondary | ICD-10-CM | POA: Diagnosis not present

## 2020-10-03 NOTE — Patient Instructions (Signed)
Your physician recommends that you schedule a follow-up appointment in: 3 MONTHS WITH DR. MCDOWELL  Your physician recommends that you continue on your current medications as directed. Please refer to the Current Medication list given to you today.  Thank you for choosing [facility]!!    

## 2020-10-03 NOTE — Progress Notes (Signed)
Cardiology Office Note  Date: 10/03/2020   ID: Lacey, Bailey 1946-05-18, MRN [id number]  PCP:  Annalee Genta, DO  Cardiologist:  Nona Dell, MD Electrophysiologist:  None   Chief Complaint  Patient presents with  . Cardiac follow-up    History of Present Illness: Lacey Bailey is a 75 y.o. female last seen in November 2021.  She presents for a routine visit with her husband today.  Still doing fairly well from a cardiac perspective.  She does have intermittent angina which responds to nitroglycerin.  She has done very well with CPAP, generally sleeps well.  We did talk about basic exercise, walking plan to keep her mobility from deteriorating.  I reviewed her medications which are outlined below and stable from a cardiac perspective.  We have not had to make any significant adjustments in in the recent past.  Blood pressure and heart rate are well controlled.  Past Medical History:  Diagnosis Date  . Anginal pain (HCC)   . Aortic sclerosis   . CAD (coronary artery disease)    a. s/p CABG in 2003 b. multiple interventions since at outside hospitals c. DES to PLB in 11/2014 d. angioplasty to mid-RCA and mid-LCx in 10/2015 with patent LIMA-LAD and occlusion of all vein grafts e. 12/2017: occlusion of vein grafts and native LCx and RCA with patent LIMA-LAD. Re-do CABG NOT recommended by CT surgery  . Depression   . Essential hypertension   . Hyperlipidemia   . Hypothyroidism   . Kidney stones   . TIA (transient ischemic attack) ~ 2013    Past Surgical History:  Procedure Laterality Date  . ABDOMINAL HYSTERECTOMY    . APPENDECTOMY    . BLEPHAROPLASTY Bilateral    "uppers"  . CARDIAC CATHETERIZATION  11/22/2015  . CARDIAC CATHETERIZATION N/A 11/22/2015   Procedure: Left Heart Cath and Cors/Grafts Angiography;  Surgeon: Marykay Lex, MD;  Location: [facility] INVASIVE CV LAB;  Service: Cardiovascular;  Laterality: N/A;  . CARDIAC CATHETERIZATION N/A 11/22/2015    Procedure: Coronary Stent Intervention;  Surgeon: Marykay Lex, MD;  Location: [facility] INVASIVE CV LAB;  Service: Cardiovascular;  Laterality: N/A;  . CATARACT EXTRACTION, BILATERAL Bilateral   . CESAREAN SECTION  1977  . CORONARY ANGIOPLASTY WITH STENT PLACEMENT  "several"   "last one was 11/2014:  DES to the posterolateral branch /notes 11/20/2015  . CORONARY ARTERY BYPASS GRAFT  2003   [place]; CABG X 4  . DILATION AND CURETTAGE OF UTERUS    . KIDNEY STONE SURGERY     "opened me up"  . LAPAROSCOPIC CHOLECYSTECTOMY    . LEFT HEART CATH AND CORS/GRAFTS ANGIOGRAPHY N/A 01/03/2018   Procedure: LEFT HEART CATH AND CORS/GRAFTS ANGIOGRAPHY;  Surgeon: [name], Peter M, MD;  Location: [facility] INVASIVE CV LAB;  Service: Cardiovascular;  Laterality: N/A;  . TONSILLECTOMY    . TUBAL LIGATION      Current Outpatient Medications  Medication Sig Dispense Refill  . aspirin 81 MG tablet Take 81 mg by mouth at bedtime.     . isosorbide mononitrate (IMDUR) 30 MG 24 hr tablet TAKE 1 TABLET BY MOUTH TWICE DAILY 180 tablet 3  . levothyroxine (SYNTHROID) 75 MCG tablet TAKE 1 TABLET BY MOUTH DAILY BEFORE BREAKFAST 90 tablet 1  . lisinopril (ZESTRIL) 5 MG tablet TAKE 1 TABLET BY MOUTH EVERY DAY 30 tablet 11  . Melatonin-Pyridoxine (MELATIN PO) Take by mouth.    . nitroGLYCERIN (NITROSTAT) 0.4 MG SL tablet DISSOLVE 1  TABLET UNDER THE TONGUE EVERY 5 MINUTES AS NEEDED FOR CHEST PAIN. DO NOT EXCEED A TOTAL OF 3 DOSES IN 15 MINUTES. 25 tablet 3  . PARoxetine (PAXIL) 40 MG tablet Take 1 tablet (40 mg total) by mouth daily. 90 tablet 1  . rosuvastatin (CRESTOR) 20 MG tablet Take 1 tablet (20 mg total) by mouth in the morning. 90 tablet 1  . solifenacin (VESICARE) 10 MG tablet Take 0.5 tablets (5 mg total) by mouth at bedtime. 30 tablet 11   No current facility-administered medications for this visit.   Allergies:  Patient has no known allergies.   ROS: No palpitations or syncope.  Physical Exam: VS:  BP 124/76   Pulse  80   Ht 5' 2.5" (1.588 m)   Wt 209 lb (94.8 kg)   SpO2 97%   BMI 37.62 kg/m , BMI Body mass index is 37.62 kg/m.  Wt Readings from Last 3 Encounters:  10/03/20 209 lb (94.8 kg)  06/24/20 212 lb (96.2 kg)  04/24/20 216 lb (98 kg)    General: Patient appears comfortable at rest. HEENT: Conjunctiva and lids normal, wearing a mask. Neck: Supple, no elevated JVP or carotid bruits, no thyromegaly. Lungs: Clear to auscultation, nonlabored breathing at rest. Cardiac: Regular rate and rhythm, no S3, soft systolic murmur, no pericardial rub. Extremities: No pitting edema.  ECG:  An ECG dated 02/02/2020 was personally reviewed today and demonstrated:  Sinus rhythm with LVH and repolarization abnormalities.  Recent Labwork: 05/17/2020: ALT 13; AST 15; BUN 16; Creatinine, Ser 1.36; Hemoglobin 13.9; Platelets 321; Potassium 4.2; Sodium 141; TSH 2.460     Component Value Date/Time   CHOL 127 05/17/2020 1113   TRIG 157 (H) 05/17/2020 1113   HDL 37 (L) 05/17/2020 1113   CHOLHDL 3.4 05/17/2020 1113   CHOLHDL 6.0 04/20/2017 1100   VLDL 69 (H) 04/20/2017 1100   LDLCALC 63 05/17/2020 1113    Other Studies Reviewed Today:  Cardiac catheterization 01/03/2018:  Post Atrio lesion is 20% stenosed.  Prox RCA to Mid RCA lesion is 100% stenosed.  Prox Cx to Dist Cx lesion is 100% stenosed.  Ost LAD to Prox LAD lesion is 100% stenosed.  LIMA graft was visualized by angiography and is large.  The graft exhibits no disease.  SVG graft was visualized by angiography.  SVG graft was visualized by angiography.  SVG graft was visualized by angiography.  Origin lesion is 100% stenosed.  Origin to Prox Graft lesion is 100% stenosed.  Origin to Prox Graft lesion is 100% stenosed.  The left ventricular systolic function is normal.  LV end diastolic pressure is normal.  The left ventricular ejection fraction is 55-65% by visual estimate.  1. Severe 3 vessel occlusive CAD 2. Patent LIMA to  the LAD 3. Occluded SVG to diagonal 4. Occluded SVG to the OM2 5. Occluded SVG to the PDA 6. Good LV function 7. Normal LVEDP  Plan: Compared to prior cardiac cath in March 2017 the stents in the mid to distal LCx and mid RCA are completely occluded. Given extensive work done on these vessels previously she is not a candidate for CTO PCI.  Echocardiogram 05/19/2017: Study Conclusions  - Left ventricle: The cavity size was normal. Wall thickness was increased in a pattern of moderate LVH. Systolic function was normal. The estimated ejection fraction was in the range of 55% to 60%. Wall motion was normal; there were no regional wall motion abnormalities. Doppler parameters are consistent with abnormal left ventricular relaxation (  grade 1 diastolic dysfunction). There was no evidence of elevated ventricular filling pressure by Doppler parameters. - Aortic valve: Mildly calcified annulus. Trileaflet; mildly thickened leaflets. Valve area (VTI): 2.36 cm^2. Valve area (Vmax): 1.98 cm^2. Valve area (Vmean): 2.13 cm^2. - Atrial septum: No defect or patent foramen ovale was identified. - Technically adequate study.  Assessment and Plan:  1. Multivessel CAD status post CABG.  She has subsequently documented graft disease. Cardiac catheterization in May 2019 revealed all vein grafts are occluded with patent LIMA to LAD. Stent sites within the mid distal circumflex and mid RCA are also occluded and revascularization options are quite limited.  She remained stable at this time without accelerating angina on current regimen.  We discussed a basic walking plan, resting as needed.  Continue aspirin, Imdur, lisinopril, Crestor, and as needed nitroglycerin.  2.  Mixed hyperlipidemia, tolerating Crestor well.  Last LDL 63.  3.  OSA on CPAP.  Medication Adjustments/Labs and Tests Ordered: Current medicines are reviewed at length with the patient today.  Concerns regarding  medicines are outlined above.   Tests Ordered: No orders of the defined types were placed in this encounter.   Medication Changes: No orders of the defined types were placed in this encounter.   Disposition:  Follow up 3 months in the [place] office.  Signed, Jonelle Sidle, MD, [facility] 10/03/2020 3:08 PM    [facility] at [facility] [address] [place], [place], [place] [address] Phone: [phone]; Fax: [phone]

## 2020-10-15 ENCOUNTER — Telehealth: Payer: Self-pay | Admitting: Cardiology

## 2020-10-15 NOTE — Telephone Encounter (Signed)
Patient called stating that she needs a note stating that she cannot serve on jury duty.

## 2020-10-15 NOTE — Telephone Encounter (Signed)
Patient informed and verbalized understanding of plan. 

## 2020-10-15 NOTE — Telephone Encounter (Signed)
Routed to MD to review

## 2020-10-15 NOTE — Telephone Encounter (Signed)
Need more information. Have her bring actual jury summons letter to office for review.

## 2020-10-16 ENCOUNTER — Encounter: Payer: Self-pay | Admitting: *Deleted

## 2020-10-16 NOTE — Telephone Encounter (Signed)
I reviewed the jury summons form. Actually, since she is 75 years old, she could have checked the above 29 age exemption and explained her concerns. If she is not willing to do that please prepare the following letter that she can pick up:  Patient informed and verbalized understanding of plan. Request to receive letter as well and have mailed to home address.

## 2020-10-20 DIAGNOSIS — G4733 Obstructive sleep apnea (adult) (pediatric): Secondary | ICD-10-CM | POA: Diagnosis not present

## 2020-10-25 ENCOUNTER — Other Ambulatory Visit: Payer: Self-pay | Admitting: Family Medicine

## 2020-11-04 ENCOUNTER — Other Ambulatory Visit: Payer: Self-pay

## 2020-11-04 ENCOUNTER — Encounter: Payer: Self-pay | Admitting: Family Medicine

## 2020-11-04 ENCOUNTER — Ambulatory Visit (INDEPENDENT_AMBULATORY_CARE_PROVIDER_SITE_OTHER): Payer: PPO | Admitting: Family Medicine

## 2020-11-04 VITALS — BP 130/82 | HR 80 | Ht 62.5 in | Wt 207.8 lb

## 2020-11-04 DIAGNOSIS — E785 Hyperlipidemia, unspecified: Secondary | ICD-10-CM | POA: Diagnosis not present

## 2020-11-04 DIAGNOSIS — I25709 Atherosclerosis of coronary artery bypass graft(s), unspecified, with unspecified angina pectoris: Secondary | ICD-10-CM | POA: Diagnosis not present

## 2020-11-04 DIAGNOSIS — I10 Essential (primary) hypertension: Secondary | ICD-10-CM | POA: Diagnosis not present

## 2020-11-04 DIAGNOSIS — E039 Hypothyroidism, unspecified: Secondary | ICD-10-CM | POA: Diagnosis not present

## 2020-11-04 DIAGNOSIS — G4733 Obstructive sleep apnea (adult) (pediatric): Secondary | ICD-10-CM

## 2020-11-04 DIAGNOSIS — F3289 Other specified depressive episodes: Secondary | ICD-10-CM

## 2020-11-04 MED ORDER — LEVOTHYROXINE SODIUM 75 MCG PO TABS: ORAL_TABLET | ORAL | 1 refills | Status: DC

## 2020-11-04 MED ORDER — ROSUVASTATIN CALCIUM 20 MG PO TABS: 20.0000 mg | ORAL_TABLET | Freq: Every morning | ORAL | 1 refills | Status: DC

## 2020-11-04 MED ORDER — PAROXETINE HCL 40 MG PO TABS: 40.0000 mg | ORAL_TABLET | Freq: Every day | ORAL | 1 refills | Status: DC

## 2020-11-04 MED ORDER — LISINOPRIL 5 MG PO TABS: 5.0000 mg | ORAL_TABLET | Freq: Every day | ORAL | 1 refills | Status: DC

## 2020-11-04 NOTE — Progress Notes (Signed)
Patient ID: Lacey Bailey, female    DOB: 10/04/1945, 75 y.o.   MRN: [id number]   Chief Complaint  Patient presents with  . Hypertension    Follow up   Subjective:    HPI Pt seen for f/u HLD, htn, thyroid disease.  Seen by cardiology about 1 month ago has h/o cad and cabg.   Had sleep apnea study in fall 2021- has cpap and feeling better.  Feeling much better.  H/o cad and CABG- Has occluded vessels and only has LIMA to LAD. Taking crestor and on cpap for osa.  Using imdur and using nitro when needed.  Last week did nitro 2x in one night.  Take one about 1x per week.  Pt stating cardiology aware of her use of nitro.  Taking tylenol pm occasionally.  Hypothyroid- taking medication daily.  No new concerns.  HTN and HLD- no chest pain, sob, leg edema or body aches.  Taking medications daily.   Medical History Meggan has a past medical history of Anginal pain ([facility]), Aortic sclerosis, CAD (coronary artery disease), Depression, Essential hypertension, Hyperlipidemia, Hypothyroidism, Kidney stones, and TIA (transient ischemic attack) (~ 2013).   Outpatient Encounter Medications as of 11/04/2020  Medication Sig  . aspirin 81 MG tablet Take 81 mg by mouth at bedtime.   . isosorbide mononitrate (IMDUR) 30 MG 24 hr tablet TAKE 1 TABLET BY MOUTH TWICE DAILY  . levothyroxine (SYNTHROID) 75 MCG tablet TAKE 1 TABLET BY MOUTH EVERY DAY BEFORE BREAKFAST  . lisinopril (ZESTRIL) 5 MG tablet Take 1 tablet (5 mg total) by mouth daily.  . Melatonin-Pyridoxine (MELATIN PO) Take by mouth.  . nitroGLYCERIN (NITROSTAT) 0.4 MG SL tablet DISSOLVE 1 TABLET UNDER THE TONGUE EVERY 5 MINUTES AS NEEDED FOR CHEST PAIN. DO NOT EXCEED A TOTAL OF 3 DOSES IN 15 MINUTES.  Marland Kitchen PARoxetine (PAXIL) 40 MG tablet Take 1 tablet (40 mg total) by mouth daily.  . rosuvastatin (CRESTOR) 20 MG tablet Take 1 tablet (20 mg total) by mouth in the morning.  . solifenacin (VESICARE) 10 MG tablet Take 0.5 tablets (5 mg total)  by mouth at bedtime.  . [DISCONTINUED] levothyroxine (SYNTHROID) 75 MCG tablet TAKE 1 TABLET BY MOUTH EVERY DAY BEFORE BREAKFAST  . [DISCONTINUED] lisinopril (ZESTRIL) 5 MG tablet TAKE 1 TABLET BY MOUTH EVERY DAY  . [DISCONTINUED] PARoxetine (PAXIL) 40 MG tablet TAKE 1 TABLET BY MOUTH EVERY DAY  . [DISCONTINUED] rosuvastatin (CRESTOR) 20 MG tablet Take 1 tablet (20 mg total) by mouth in the morning.   No facility-administered encounter medications on file as of 11/04/2020.     Review of Systems  Constitutional: Negative for chills and fever.  HENT: Negative for congestion, rhinorrhea and sore throat.   Respiratory: Negative for cough, shortness of breath and wheezing.   Cardiovascular: Negative for chest pain and leg swelling.  Gastrointestinal: Negative for abdominal pain, diarrhea, nausea and vomiting.  Genitourinary: Negative for dysuria and frequency.  Musculoskeletal: Negative for arthralgias and back pain.  Skin: Negative for rash.  Neurological: Negative for dizziness, weakness and headaches.     Vitals BP 130/82   Pulse 80   Ht 5' 2.5" (1.588 m)   Wt 207 lb 12.8 oz (94.3 kg)   BMI 37.40 kg/m   Objective:   Physical Exam Vitals and nursing note reviewed.  Constitutional:      Appearance: Normal appearance.  HENT:     Head: Normocephalic and atraumatic.     Nose: Nose normal.  Mouth/Throat:     Mouth: Mucous membranes are moist.     Pharynx: Oropharynx is clear.  Eyes:     Extraocular Movements: Extraocular movements intact.     Conjunctiva/sclera: Conjunctivae normal.     Pupils: Pupils are equal, round, and reactive to light.  Cardiovascular:     Rate and Rhythm: Normal rate and regular rhythm.     Pulses: Normal pulses.     Heart sounds: Murmur heard.    Pulmonary:     Effort: Pulmonary effort is normal.     Breath sounds: Normal breath sounds. No wheezing, rhonchi or rales.  Musculoskeletal:        General: Normal range of motion.     Right lower  leg: No edema.     Left lower leg: No edema.  Skin:    General: Skin is warm and dry.     Findings: No lesion or rash.  Neurological:     General: No focal deficit present.     Mental Status: She is alert and oriented to person, place, and time.  Psychiatric:        Mood and Affect: Mood normal.        Behavior: Behavior normal.      Assessment and Plan   1. Essential hypertension - lisinopril (ZESTRIL) 5 MG tablet; Take 1 tablet (5 mg total) by mouth daily.  Dispense: 90 tablet; Refill: 1 - CBC - CMP14+EGFR - Lipid panel  2. Hypothyroidism, unspecified type - levothyroxine (SYNTHROID) 75 MCG tablet; TAKE 1 TABLET BY MOUTH EVERY DAY BEFORE BREAKFAST  Dispense: 90 tablet; Refill: 1 - TSH  3. Atherosclerosis of coronary artery bypass graft of native heart with angina pectoris ([place])  4. Hyperlipidemia with target LDL less than 70 - rosuvastatin (CRESTOR) 20 MG tablet; Take 1 tablet (20 mg total) by mouth in the morning.  Dispense: 90 tablet; Refill: 1  5. OSA (obstructive sleep apnea)  6. Other depression - PARoxetine (PAXIL) 40 MG tablet; Take 1 tablet (40 mg total) by mouth daily.  Dispense: 90 tablet; Refill: 1    Labs ordered.  htn-stable. Cont meds. Hypothyroid- recheck labs. Cont meds. hld- stable. Cont meds. Cont f/u with cards for cad.   osa- stable. Cont cpap.  Return in about 6 months (around 05/07/2021) for f/u htn, hld, thyroid.

## 2020-11-17 DIAGNOSIS — G4733 Obstructive sleep apnea (adult) (pediatric): Secondary | ICD-10-CM | POA: Diagnosis not present

## 2020-12-18 DIAGNOSIS — G4733 Obstructive sleep apnea (adult) (pediatric): Secondary | ICD-10-CM | POA: Diagnosis not present

## 2021-01-08 ENCOUNTER — Encounter: Payer: Self-pay | Admitting: Cardiology

## 2021-01-08 ENCOUNTER — Ambulatory Visit: Payer: PPO | Admitting: Cardiology

## 2021-01-08 VITALS — BP 128/82 | HR 74 | Ht 62.5 in | Wt 208.0 lb

## 2021-01-08 DIAGNOSIS — Z9989 Dependence on other enabling machines and devices: Secondary | ICD-10-CM

## 2021-01-08 DIAGNOSIS — G4733 Obstructive sleep apnea (adult) (pediatric): Secondary | ICD-10-CM | POA: Diagnosis not present

## 2021-01-08 DIAGNOSIS — I25119 Atherosclerotic heart disease of native coronary artery with unspecified angina pectoris: Secondary | ICD-10-CM

## 2021-01-08 DIAGNOSIS — E782 Mixed hyperlipidemia: Secondary | ICD-10-CM

## 2021-01-08 MED ORDER — BISOPROLOL FUMARATE 5 MG PO TABS: 2.5000 mg | ORAL_TABLET | Freq: Every day | ORAL | 1 refills | Status: DC

## 2021-01-08 NOTE — Patient Instructions (Addendum)
Medication Instructions:  Your physician has recommended you make the following change in your medication:  Start bisoprolol 2.5 mg by mouth daily Continue other medications the same  Labwork: none  Testing/Procedures: none  Follow-Up: Your physician recommends that you schedule a follow-up appointment in: 3 months  Any Other Special Instructions Will Be Listed Below (If Applicable).  If you need a refill on your cardiac medications before your next appointment, please call your pharmacy. 

## 2021-01-08 NOTE — Progress Notes (Signed)
Cardiology Office Note  Date: 01/08/2021   ID: [name], [name] 10-18-1945, MRN [id number]  PCP:  Annalee Genta, DO  Cardiologist:  Nona Dell, MD Electrophysiologist:  None   Chief Complaint  Patient presents with  . Cardiac follow-up    History of Present Illness: Lacey Bailey is a 75 y.o. female last seen in February.  She is here for a routine visit.  She does not describe any progressive angina on current medications.  Tends to be somewhat sluggish in the mornings but has a better energy level in the afternoons.  She reports compliance with her medications as noted below.  I did talk with her about resumption of a low-dose beta-blocker for additional antianginal effect.  Her resting heart rate is in the 70s to 80s.  I personally reviewed her ECG today which shows sinus rhythm with LVH and repolarization abnormalities - ST changes are stable.  Past Medical History:  Diagnosis Date  . Anginal pain ([facility])   . Aortic sclerosis   . CAD (coronary artery disease)    a. s/p CABG in 2003 b. multiple interventions since at outside hospitals c. DES to PLB in 11/2014 d. angioplasty to mid-RCA and mid-LCx in 10/2015 with patent LIMA-LAD and occlusion of all vein grafts e. 12/2017: occlusion of vein grafts and native LCx and RCA with patent LIMA-LAD. Re-do CABG NOT recommended by CT surgery  . Depression   . Essential hypertension   . Hyperlipidemia   . Hypothyroidism   . Kidney stones   . TIA (transient ischemic attack) ~ 2013    Past Surgical History:  Procedure Laterality Date  . ABDOMINAL HYSTERECTOMY    . APPENDECTOMY    . BLEPHAROPLASTY Bilateral    "uppers"  . CARDIAC CATHETERIZATION  11/22/2015  . CARDIAC CATHETERIZATION N/A 11/22/2015   Procedure: Left Heart Cath and Cors/Grafts Angiography;  Surgeon: Marykay Lex, MD;  Location: [facility] INVASIVE CV LAB;  Service: Cardiovascular;  Laterality: N/A;  . CARDIAC CATHETERIZATION N/A 11/22/2015   Procedure: Coronary  Stent Intervention;  Surgeon: Marykay Lex, MD;  Location: [facility] INVAS[facility] CV LAB;  Service: Cardiovascular;  Laterality: N/A;  . CATARACT EXTRACTION, BILATERAL Bilateral   . CESAREAN SECTION  1977  . CORONARY ANGIOPLASTY WITH STENT PLACEMENT  "several"   "last one was 11/2014:  DES to the posterolateral branch /notes 11/20/2015  . CORONARY ARTERY BYPASS GRAFT  2003   [place]; CABG X 4  . DILATION AND CURETTAGE OF UTERUS    . KIDNEY STONE SURGERY     "opened me up"  . LAPAROSCOPIC CHOLECYSTECTOMY    . LEFT HEART CATH AND CORS/GRAFTS ANGIOGRAPHY N/A 01/03/2018   Procedure: LEFT HEART CATH AND CORS/GRAFTS ANGIOGRAPHY;  Surgeon: [name], Peter M, MD;  Location: [facility] INVASIVE CV LAB;  Service: Cardiovascular;  Laterality: N/A;  . TONSILLECTOMY    . TUBAL LIGATION      Current Outpatient Medications  Medication Sig Dispense Refill  . aspirin 81 MG tablet Take 81 mg by mouth at bedtime.     . bisoprolol (ZEBETA) 5 MG tablet Take 0.5 tablets (2.5 mg total) by mouth daily. 45 tablet 1  . isosorbide mononitrate (IMDUR) 30 MG 24 hr tablet TAKE 1 TABLET BY MOUTH TWICE DAILY 180 tablet 3  . levothyroxine (SYNTHROID) 75 MCG tablet TAKE 1 TABLET BY MOUTH EVERY DAY BEFORE BREAKFAST 90 tablet 1  . lisinopril (ZESTRIL) 5 MG tablet Take 1 tablet (5 mg total) by mouth daily. 90 tablet  1  . Melatonin-Pyridoxine (MELATIN PO) Take by mouth.    . nitroGLYCERIN (NITROSTAT) 0.4 MG SL tablet DISSOLVE 1 TABLET UNDER THE TONGUE EVERY 5 MINUTES AS NEEDED FOR CHEST PAIN. DO NOT EXCEED A TOTAL OF 3 DOSES IN 15 MINUTES. 25 tablet 3  . PARoxetine (PAXIL) 40 MG tablet Take 1 tablet (40 mg total) by mouth daily. 90 tablet 1  . rosuvastatin (CRESTOR) 20 MG tablet Take 1 tablet (20 mg total) by mouth in the morning. 90 tablet 1  . solifenacin (VESICARE) 10 MG tablet Take 0.5 tablets (5 mg total) by mouth at bedtime. 30 tablet 11   No current facility-administered medications for this visit.   Allergies:  Patient has no known  allergies.   ROS: No palpitations or syncope.  Physical Exam: VS:  BP 128/82   Pulse 74   Ht 5' 2.5" (1.588 m)   Wt 208 lb (94.3 kg)   SpO2 97%   BMI 37.44 kg/m , BMI Body mass index is 37.44 kg/m.  Wt Readings from Last 3 Encounters:  01/08/21 208 lb (94.3 kg)  11/04/20 207 lb 12.8 oz (94.3 kg)  10/03/20 209 lb (94.8 kg)    General: Patient appears comfortable at rest. HEENT: Conjunctiva and lids normal, wearing a mask. Neck: Supple, no elevated JVP or carotid bruits, no thyromegaly. Lungs: Clear to auscultation, nonlabored breathing at rest. Cardiac: Regular rate and rhythm, no S3, 1/6 systolic murmur, no pericardial rub. Extremities: No pitting edema.  ECG:  An ECG dated 02/02/2020 was personally reviewed today and demonstrated:  Sinus rhythm with LVH and repolarization abnormalities.  Recent Labwork: 05/17/2020: ALT 13; AST 15; BUN 16; Creatinine, Ser 1.36; Hemoglobin 13.9; Platelets 321; Potassium 4.2; Sodium 141; TSH 2.460     Component Value Date/Time   CHOL 127 05/17/2020 1113   TRIG 157 (H) 05/17/2020 1113   HDL 37 (L) 05/17/2020 1113   CHOLHDL 3.4 05/17/2020 1113   CHOLHDL 6.0 04/20/2017 1100   VLDL 69 (H) 04/20/2017 1100   LDLCALC 63 05/17/2020 1113    Other Studies Reviewed Today:  Cardiac catheterization 01/03/2018:  Post Atrio lesion is 20% stenosed.  Prox RCA to Mid RCA lesion is 100% stenosed.  Prox Cx to Dist Cx lesion is 100% stenosed.  Ost LAD to Prox LAD lesion is 100% stenosed.  LIMA graft was visualized by angiography and is large.  The graft exhibits no disease.  SVG graft was visualized by angiography.  SVG graft was visualized by angiography.  SVG graft was visualized by angiography.  Origin lesion is 100% stenosed.  Origin to Prox Graft lesion is 100% stenosed.  Origin to Prox Graft lesion is 100% stenosed.  The left ventricular systolic function is normal.  LV end diastolic pressure is normal.  The left ventricular  ejection fraction is 55-65% by visual estimate.  1. Severe 3 vessel occlusive CAD 2. Patent LIMA to the LAD 3. Occluded SVG to diagonal 4. Occluded SVG to the OM2 5. Occluded SVG to the PDA 6. Good LV function 7. Normal LVEDP  Plan: Compared to prior cardiac cath in March 2017 the stents in the mid to distal LCx and mid RCA are completely occluded. Given extensive work done on these vessels previously she is not a candidate for CTO PCI.  Echocardiogram 05/19/2017: Study Conclusions  - Left ventricle: The cavity size was normal. Wall thickness was increased in a pattern of moderate LVH. Systolic function was normal. The estimated ejection fraction was in the range of  55% to 60%. Wall motion was normal; there were no regional wall motion abnormalities. Doppler parameters are consistent with abnormal left ventricular relaxation (grade 1 diastolic dysfunction). There was no evidence of elevated ventricular filling pressure by Doppler parameters. - Aortic valve: Mildly calcified annulus. Trileaflet; mildly thickened leaflets. Valve area (VTI): 2.36 cm^2. Valve area (Vmax): 1.98 cm^2. Valve area (Vmean): 2.13 cm^2. - Atrial septum: No defect or patent foramen ovale was identified. - Technically adequate study.  Assessment and Plan:  1.  Multivessel CAD status post CABG.  LIMA to LAD is patent with other vein grafts occluded and stent sites within the mid to distal circumflex as well as mid RCA are occluded.  In the absence of good revascularization options, medical therapy is being pursued.  Fortunately, she continues to do relatively well in terms of angina control.  Plan to start low-dose bisoprolol 2.5 mg daily.  Also continue aspirin, Imdur, lisinopril, Crestor, and as needed nitroglycerin.  2.  Mixed hyperlipidemia on Crestor.  Last LDL 63.  3.  OSA, tolerating CPAP.  Medication Adjustments/Labs and Tests Ordered: Current medicines are reviewed at length  with the patient today.  Concerns regarding medicines are outlined above.   Tests Ordered: Orders Placed This Encounter  Procedures  . EKG 12-Lead    Medication Changes: Meds ordered this encounter  Medications  . bisoprolol (ZEBETA) 5 MG tablet    Sig: Take 0.5 tablets (2.5 mg total) by mouth daily.    Dispense:  45 tablet    Refill:  1    01/08/2021 NEW    Disposition:  Follow up 3 months.  Signed, Jonelle Sidle, MD, [facility] 01/08/2021 2:46 PM    [facility] [address] [place], [place], [place] [address] Phone: [phone]; Fax: [phone]

## 2021-01-17 DIAGNOSIS — G4733 Obstructive sleep apnea (adult) (pediatric): Secondary | ICD-10-CM | POA: Diagnosis not present

## 2021-01-27 ENCOUNTER — Telehealth (INDEPENDENT_AMBULATORY_CARE_PROVIDER_SITE_OTHER): Payer: PPO | Admitting: Family Medicine

## 2021-01-27 ENCOUNTER — Other Ambulatory Visit: Payer: Self-pay

## 2021-01-27 DIAGNOSIS — M792 Neuralgia and neuritis, unspecified: Secondary | ICD-10-CM | POA: Diagnosis not present

## 2021-01-27 DIAGNOSIS — H9201 Otalgia, right ear: Secondary | ICD-10-CM | POA: Diagnosis not present

## 2021-01-27 DIAGNOSIS — G8929 Other chronic pain: Secondary | ICD-10-CM | POA: Insufficient documentation

## 2021-01-27 MED ORDER — GABAPENTIN 100 MG PO CAPS: 100.0000 mg | ORAL_CAPSULE | Freq: Three times a day (TID) | ORAL | 0 refills | Status: DC

## 2021-01-27 NOTE — Progress Notes (Signed)
Patient ID: Lacey Bailey, female    DOB: 07/21/1946, 75 y.o.   MRN: [id number]  Virtual Visit via Telephone Note  I connected with Molli Barrows on 01/27/21 at  2:50 PM EDT by telephone and verified that I am speaking with the correct person using two identifiers.  Location: Patient: home Provider: office   I discussed the limitations, risks, security and privacy concerns of performing an evaluation and management service by telephone and the availability of in person appointments. I also discussed with the patient that there may be a patient responsible charge related to this service. The patient expressed understanding and agreed to proceed.   Chief Complaint  Patient presents with  . Otalgia   Subjective:    HPI   CC- right ear pain for past 2 -3 days ago. Taking ibuprofen. Pt states she was seen for this about years ago. ( see note from 02/22/19)   Shooting pain, was constant in past, now it's returning with sharp pain. Was told neuropathic pain.  Was taking gabapentin in past. Started 3 days ago. Lasting about 30 mins to resolve after taking ibuprofen. No recent illness. No h/o recurrent ear infections. Was given gabapentin in past.  No decrease in hearing or ear ringing. No fever.  From previous note was dx with neuropathic pain/headache and used gabapentin 100mg  tid prn pain.  [facility].   Medical History Baudelia has a past medical history of Anginal pain (HCC), Aortic sclerosis, CAD (coronary artery disease), Depression, Essential hypertension, Hyperlipidemia, Hypothyroidism, Kidney stones, and TIA (transient ischemic attack) (~ 2013).   Outpatient Encounter Medications as of 01/27/2021  Medication Sig  . aspirin 81 MG tablet Take 81 mg by mouth at bedtime.   . bisoprolol (ZEBETA) 5 MG tablet Take 0.5 tablets (2.5 mg total) by mouth daily.  . isosorbide mononitrate (IMDUR) 30 MG 24 hr tablet TAKE 1 TABLET BY MOUTH TWICE DAILY  . levothyroxine (SYNTHROID) 75 MCG  tablet TAKE 1 TABLET BY MOUTH EVERY DAY BEFORE BREAKFAST  . lisinopril (ZESTRIL) 5 MG tablet Take 1 tablet (5 mg total) by mouth daily.  . Melatonin-Pyridoxine (MELATIN PO) Take by mouth.  . nitroGLYCERIN (NITROSTAT) 0.4 MG SL tablet DISSOLVE 1 TABLET UNDER THE TONGUE EVERY 5 MINUTES AS NEEDED FOR CHEST PAIN. DO NOT EXCEED A TOTAL OF 3 DOSES IN 15 MINUTES.  03/29/2021 PARoxetine (PAXIL) 40 MG tablet Take 1 tablet (40 mg total) by mouth daily.  . rosuvastatin (CRESTOR) 20 MG tablet Take 1 tablet (20 mg total) by mouth in the morning.  . solifenacin (VESICARE) 10 MG tablet Take 0.5 tablets (5 mg total) by mouth at bedtime.  . gabapentin (NEURONTIN) 100 MG capsule Take 1 capsule (100 mg total) by mouth 3 (three) times daily.   No facility-administered encounter medications on file as of 01/27/2021.     Review of Systems  Constitutional: Negative for chills and fever.  HENT: Positive for ear pain (rt). Negative for congestion, ear discharge, hearing loss, rhinorrhea, sinus pressure, sinus pain, sore throat and tinnitus.   Eyes: Negative for pain, discharge and itching.  Respiratory: Negative for cough.   Gastrointestinal: Negative for constipation, diarrhea, nausea and vomiting.  Neurological: Negative for headaches.     Vitals There were no vitals taken for this visit.  Objective:   Physical Exam No PE due to phone visit.   Assessment and Plan   1. Neuropathic pain - gabapentin (NEURONTIN) 100 MG capsule; Take 1 capsule (100 mg total) by mouth  3 (three) times daily.  Dispense: 90 capsule; Refill: 0  2. Otalgia of right ear - gabapentin (NEURONTIN) 100 MG capsule; Take 1 capsule (100 mg total) by mouth 3 (three) times daily.  Dispense: 90 capsule; Refill: 0    Gave pt gabapentin as she was on previously. Advised if not improving in next 2-3 days or more pain, or fever to come in for ear exam. Pt in agreement.  Return if symptoms worsen or fail to improve.  01/27/2021    Follow Up  Instructions:    I discussed the assessment and treatment plan with the patient. The patient was provided an opportunity to ask questions and all were answered. The patient agreed with the plan and demonstrated an understanding of the instructions.   The patient was advised to call back or seek an in-person evaluation if the symptoms worsen or if the condition fails to improve as anticipated.  I provided 15 minutes of non-face-to-face time during this encounter.

## 2021-01-31 ENCOUNTER — Telehealth: Payer: Self-pay | Admitting: Family Medicine

## 2021-01-31 NOTE — Telephone Encounter (Signed)
Left message for patient to call back and schedule Medicare Annual Wellness Visit (AWV).   Please offer to do virtually or by telephone.   Last AWV:04/28/2017  Please schedule at anytime with Nurse Health Advisor.

## 2021-02-02 ENCOUNTER — Other Ambulatory Visit: Payer: Self-pay | Admitting: Cardiology

## 2021-02-03 ENCOUNTER — Encounter: Payer: Self-pay | Admitting: Urology

## 2021-02-03 ENCOUNTER — Ambulatory Visit: Payer: PPO | Admitting: Urology

## 2021-02-03 ENCOUNTER — Other Ambulatory Visit: Payer: Self-pay

## 2021-02-03 VITALS — BP 132/78 | HR 60 | Ht 62.5 in | Wt 209.0 lb

## 2021-02-03 DIAGNOSIS — N3281 Overactive bladder: Secondary | ICD-10-CM

## 2021-02-03 MED ORDER — GEMTESA 75 MG PO TABS: 1.0000 | ORAL_TABLET | Freq: Every day | ORAL | 0 refills | Status: DC

## 2021-02-03 NOTE — Progress Notes (Signed)
02/03/2021 3:40 PM   Lacey Bailey 05/12/1946 [id number]  Referring provider: Annalee Genta, DO [address] [place],  [place] [address]  Followup OAB   HPI: Lacey Bailey is a 74yo here for followup for OAB. She is currently on vesicare 10mg  daily. She notes 6 months ago the urge incontinence has worsened. She uses 6-7 pads per day which are soaked. She has nocturia 3x. She has occasional unaware urinary incontinence. She previously tried mirabegron 50mg  which failed to improve her incontinence.   Her records from [facility] are as follows: I have an overactive bladder.  HPI: Lacey Bailey is a 75 year-old female established patient who is here for evaluation of their overactive bladder.  She does have urgency. She does have problems getting to the bathroom in time after she has the urge to urinate. She does not urinate more frequently than once every 4 hours in the daytime.   She is not satisfied with the way she is voiding. She does wear protective pads. She wears 3-4 pads per day. She gets up at night to urinate 3 times. She is not having problems with emptying her bladder well.   02/12/2016: She has been stable on mirabegron 50mg  and did not notice a difference with increasing the dosage from 25mg . She continues to have urgency and daily urge incontinence. She is bothered by her LUTS   03/08/2019: Starting 1 year ago she developed worsening urge urinary incontinence with occasional incontinent episodes that she is unaware. She has fecal incontinence. She is no longer on mirabegron.   04/19/2019: UDS shows capacity of 300cc. She had multiple unstable detrusor contractions. Sometimes during the test she was unaware of the detrusor contractions.   05/31/2019: She notes significant improvement in her OAB symptoms with vesicare     CC: I leak when I have the urge to urinate.  HPI: She does have problems getting to the bathroom in time after she has the urge to urinate. She has had an accident  when she couldn't get to the bathroom in time . She has 3 episodes of urge incontinence per day. Her urge incontinence began approximately 01/23/2011.   She generally urinates every 2 hours in the daytime. She gets up at night to urinate 2 times. She is not having problems with emptying her bladder well.   02/12/2016: She has longstanding OAb and urge incontinence. She is currently on mirabegron 50mg  daily with decent results   03/08/2019: She has developed worsening urgency with urge incontinence. she is no longer on mirabegron.   04/19/2019: Since last visit her urge incontinence has worsened.   05/31/2019: Her incontinence has improved with vesicare 5mg  daily     PMH: Past Medical History:  Diagnosis Date   Anginal pain (HCC)    Aortic sclerosis    CAD (coronary artery disease)    a. s/p CABG in 2003 b. multiple interventions since at outside hospitals c. DES to PLB in 11/2014 d. angioplasty to mid-RCA and mid-LCx in 10/2015 with patent LIMA-LAD and occlusion of all vein grafts e. 12/2017: occlusion of vein grafts and native LCx and RCA with patent LIMA-LAD. Re-do CABG NOT recommended by CT surgery   Depression    Essential hypertension    Hyperlipidemia    Hypothyroidism    Kidney stones    TIA (transient ischemic attack) ~ 2013    Surgical History: Past Surgical History:  Procedure Laterality Date   ABDOMINAL HYSTERECTOMY     APPENDECTOMY  BLEPHAROPLASTY Bilateral    "uppers"   CARDIAC CATHETERIZATION  11/22/2015   CARDIAC CATHETERIZATION N/A 11/22/2015   Procedure: Left Heart Cath and Cors/Grafts Angiography;  Surgeon: Marykay Lex, MD;  Location: [facility] INVASIVE CV LAB;  Service: Cardiovascular;  Laterality: N/A;   CARDIAC CATHETERIZATION N/A 11/22/2015   Procedure: Coronary Stent Intervention;  Surgeon: Marykay Lex, MD;  Location: [facility] INVASIVE CV LAB;  Service: Cardiovascular;  Laterality: N/A;   CATARACT EXTRACTION, BILATERAL Bilateral    CESAREAN SECTION  1977    CORONARY ANGIOPLASTY WITH STENT PLACEMENT  "several"   "last one was 11/2014:  DES to the posterolateral branch /notes 11/20/2015   CORONARY ARTERY BYPASS GRAFT  2003   [place]; CABG X 4   DILATION AND CURETTAGE OF UTERUS     KIDNEY STONE SURGERY     "opened me up"   LAPAROSCOPIC CHOLECYSTECTOMY     LEFT HEART CATH AND CORS/GRAFTS ANGIOGRAPHY N/A 01/03/2018   Procedure: LEFT HEART CATH AND CORS/GRAFTS ANGIOGRAPHY;  Surgeon: [name], Peter M, MD;  Location: [facility] INVASIVE CV LAB;  Service: Cardiovascular;  Laterality: N/A;   TONSILLECTOMY     TUBAL LIGATION      Home Medications:  Allergies as of 02/03/2021   No Known Allergies      Medication List        Accurate as of February 03, 2021  3:40 PM. If you have any questions, ask your nurse or doctor.          aspirin 81 MG tablet Take 81 mg by mouth at bedtime.   bisoprolol 5 MG tablet Commonly known as: ZEBETA Take 0.5 tablets (2.5 mg total) by mouth daily.   gabapentin 100 MG capsule Commonly known as: NEURONTIN Take 1 capsule (100 mg total) by mouth 3 (three) times daily.   isosorbide mononitrate 30 MG 24 hr tablet Commonly known as: IMDUR TAKE 1 TABLET BY MOUTH TWICE DAILY   levothyroxine 75 MCG tablet Commonly known as: SYNTHROID TAKE 1 TABLET BY MOUTH EVERY DAY BEFORE BREAKFAST   lisinopril 5 MG tablet Commonly known as: ZESTRIL Take 1 tablet (5 mg total) by mouth daily.   MELATIN PO Take by mouth.   nitroGLYCERIN 0.4 MG SL tablet Commonly known as: NITROSTAT DISSOLVE 1 TABLET UNDER THE TONGUE EVERY 5 MINUTES AS NEEDED FOR CHEST PAIN. DO NOT EXCEED A TOTAL OF 3 DOSES IN 15 MINUTES.   PARoxetine 40 MG tablet Commonly known as: PAXIL Take 1 tablet (40 mg total) by mouth daily.   rosuvastatin 20 MG tablet Commonly known as: CRESTOR Take 1 tablet (20 mg total) by mouth in the morning.   solifenacin 10 MG tablet Commonly known as: VESICARE Take 0.5 tablets (5 mg total) by mouth at bedtime.         Allergies: No Known Allergies  Family History: Family History  Problem Relation Age of Onset   Heart attack Mother    Heart disease Mother    Heart disease Brother     Social History:  reports that she has never smoked. She has never used smokeless tobacco. She reports previous alcohol use. She reports that she does not use drugs.  ROS: All other review of systems were reviewed and are negative except what is noted above in HPI  Physical Exam: BP 132/78   Pulse 60   Ht 5' 2.5" (1.588 m)   Wt 209 lb (94.8 kg)   BMI 37.62 kg/m   Constitutional:  Alert and oriented, No acute distress.  HEENT:  AT, moist mucus membranes.  Trachea midline, no masses. Cardiovascular: No clubbing, cyanosis, or edema. Respiratory: Normal respiratory effort, no increased work of breathing. GI: Abdomen is soft, nontender, nondistended, no abdominal masses GU: No CVA tenderness.  Lymph: No cervical or inguinal lymphadenopathy. Skin: No rashes, bruises or suspicious lesions. Neurologic: Grossly intact, no focal deficits, moving all 4 extremities. Psychiatric: Normal mood and affect.  Laboratory Data: Lab Results  Component Value Date   WBC 6.7 05/17/2020   HGB 13.9 05/17/2020   HCT 41.3 05/17/2020   MCV 91 05/17/2020   PLT 321 05/17/2020    Lab Results  Component Value Date   CREATININE 1.36 (H) 05/17/2020    No results found for: PSA  No results found for: TESTOSTERONE  No results found for: HGBA1C  Urinalysis No results found for: COLORURINE, APPEARANCEUR, LABSPEC, PHURINE, GLUCOSEU, HGBUR, BILIRUBINUR, KETONESUR, PROTEINUR, UROBILINOGEN, NITRITE, LEUKOCYTESUR  No results found for: LABMICR, WBCUA, RBCUA, LABEPIT, MUCUS, BACTERIA  Pertinent Imaging:  No results found for this or any previous visit.  No results found for this or any previous visit.  No results found for this or any previous visit.  No results found for this or any previous visit.  No results found for  this or any previous visit.  No results found for this or any previous visit.  No results found for this or any previous visit.  No results found for this or any previous visit.   Assessment & Plan:    1. OAB (overactive bladder) -Gemtesa 75mg  daily    No follow-ups on file.  , MD  [facility] [place]

## 2021-02-03 NOTE — Patient Instructions (Signed)

## 2021-02-03 NOTE — Progress Notes (Signed)
Urological Symptom Review  Patient is experiencing the following symptoms: Frequent urination Hard to postpone urination Get up at night to urinate Leakage of urine   Review of Systems  Gastrointestinal (upper)  : Negative for upper GI symptoms  Gastrointestinal (lower) : Negative for lower GI symptoms  Constitutional : Fatigue  Skin: Negative for skin symptoms  Eyes: Negative for eye symptoms  Ear/Nose/Throat : Negative for Ear/Nose/Throat symptoms  Hematologic/Lymphatic: Negative for Hematologic/Lymphatic symptoms  Cardiovascular : Negative for cardiovascular symptoms  Respiratory : Negative for respiratory symptoms  Endocrine: Negative for endocrine symptoms  Musculoskeletal: Negative for musculoskeletal symptoms  Neurological: Negative for neurological symptoms  Psychologic: Negative for psychiatric symptoms

## 2021-02-16 IMAGING — DX LEFT KNEE - COMPLETE 4+ VIEW
4 series · 4 of 4 positions shown · non-contrast
Comparison: None.

CLINICAL DATA: Fall last night. Pain extending from the knee into
the ankle.

EXAM:
LEFT KNEE - COMPLETE 4+ VIEW

[knee ap]
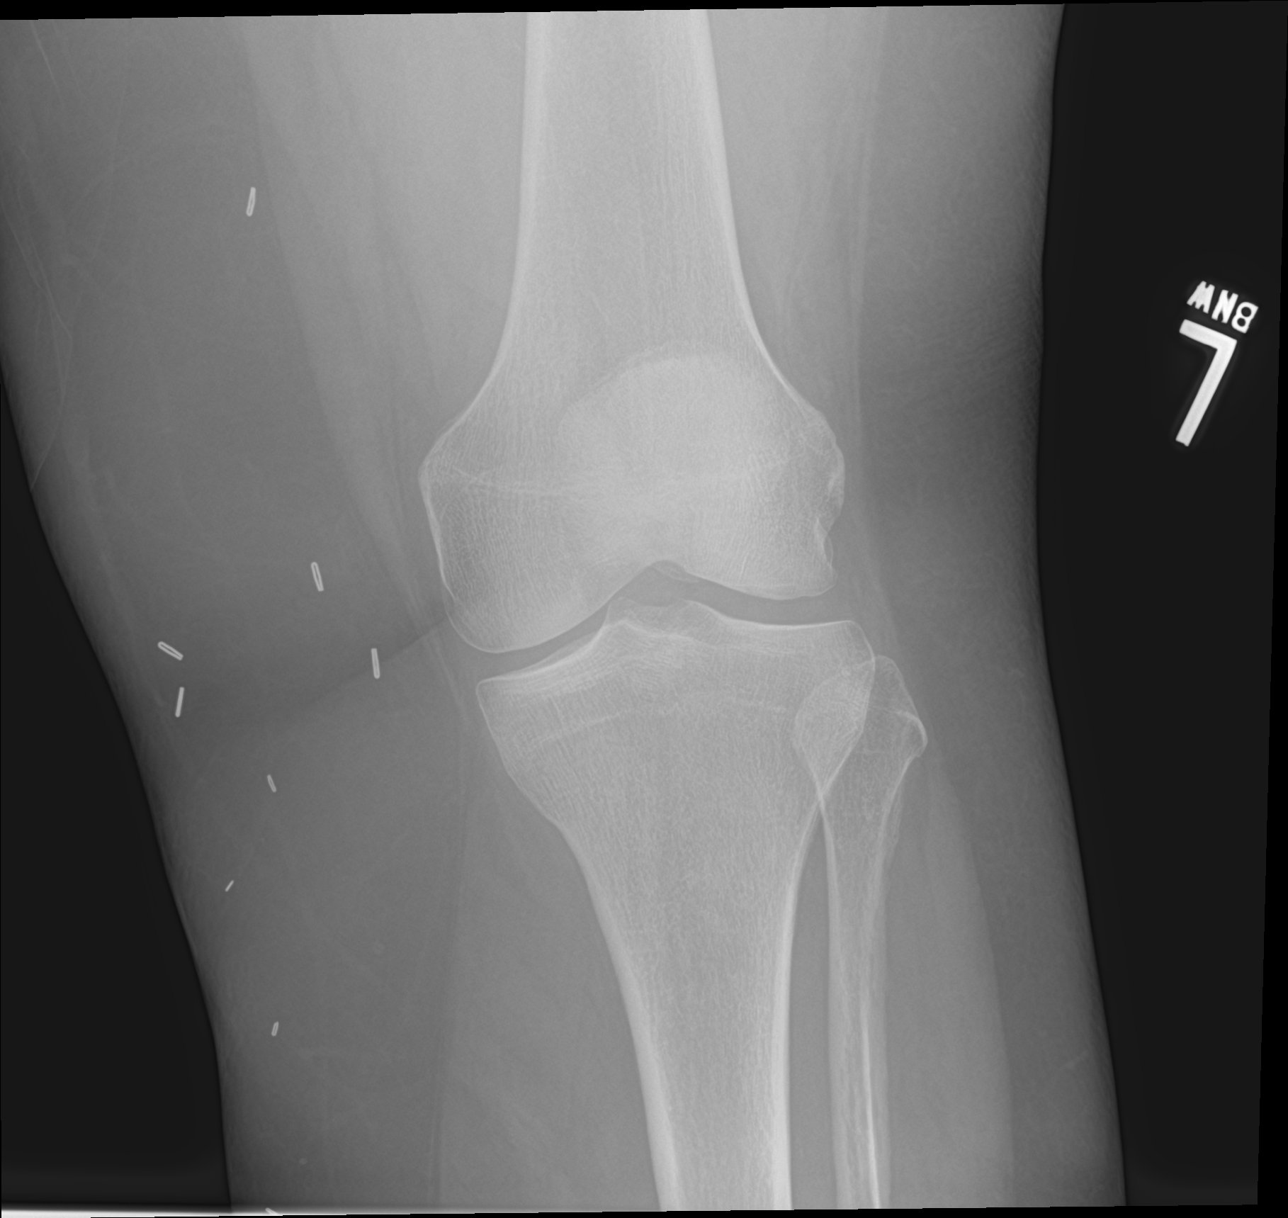

[tunnel]
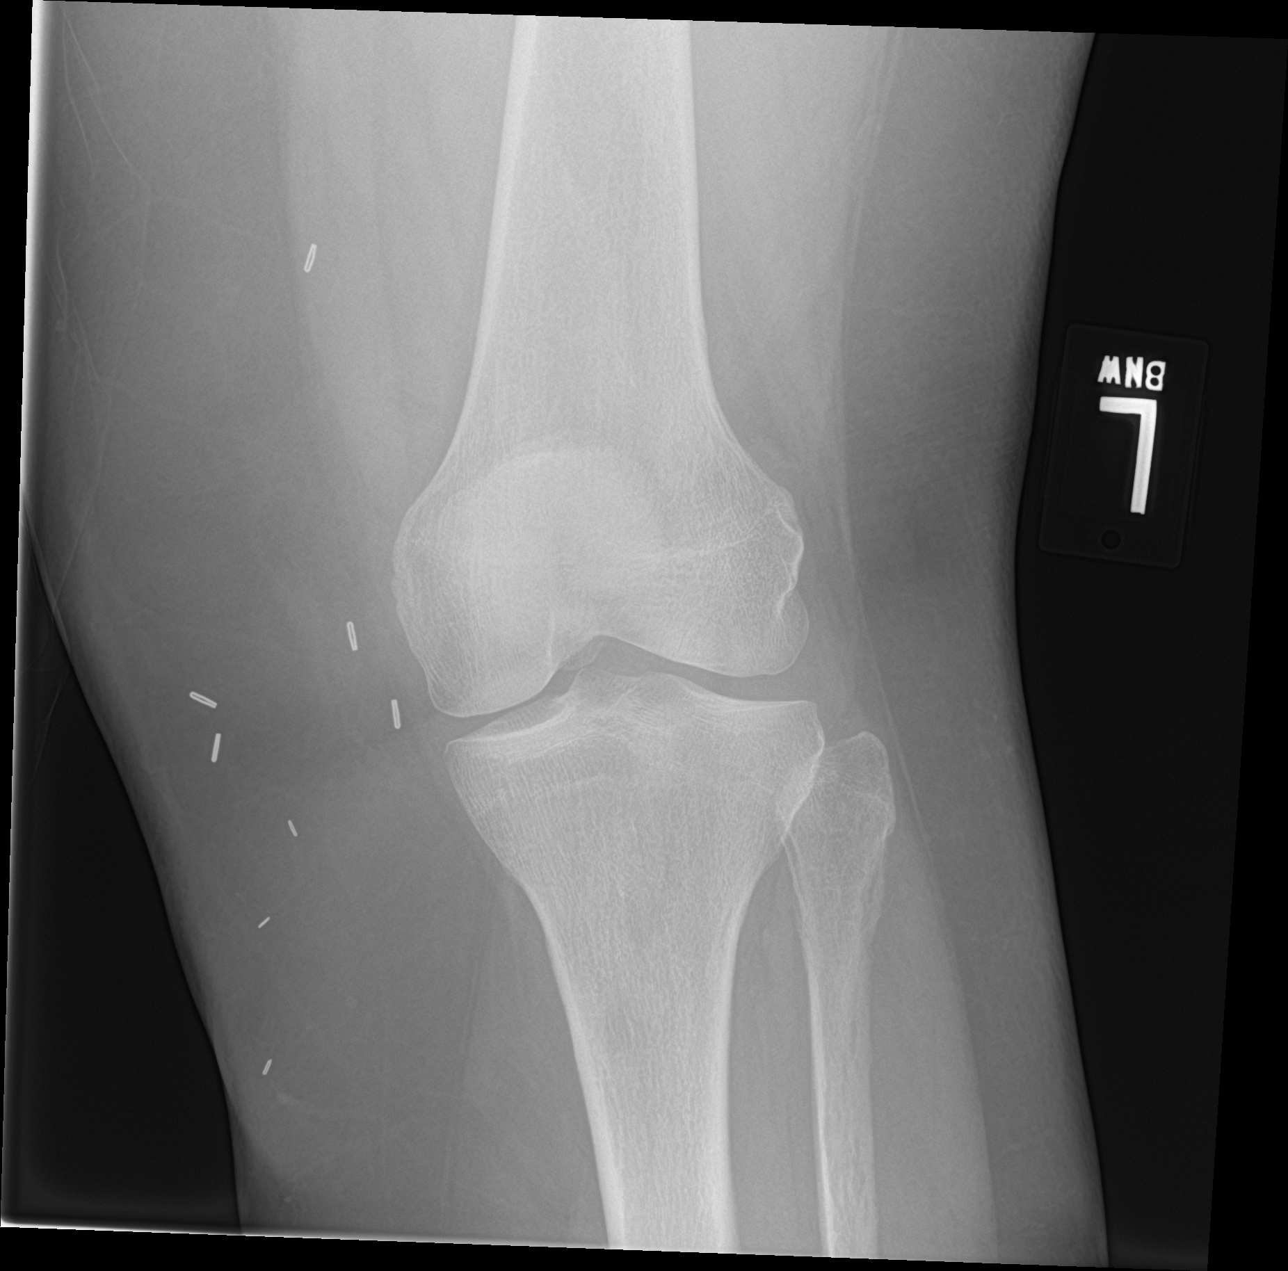

[knee lat]
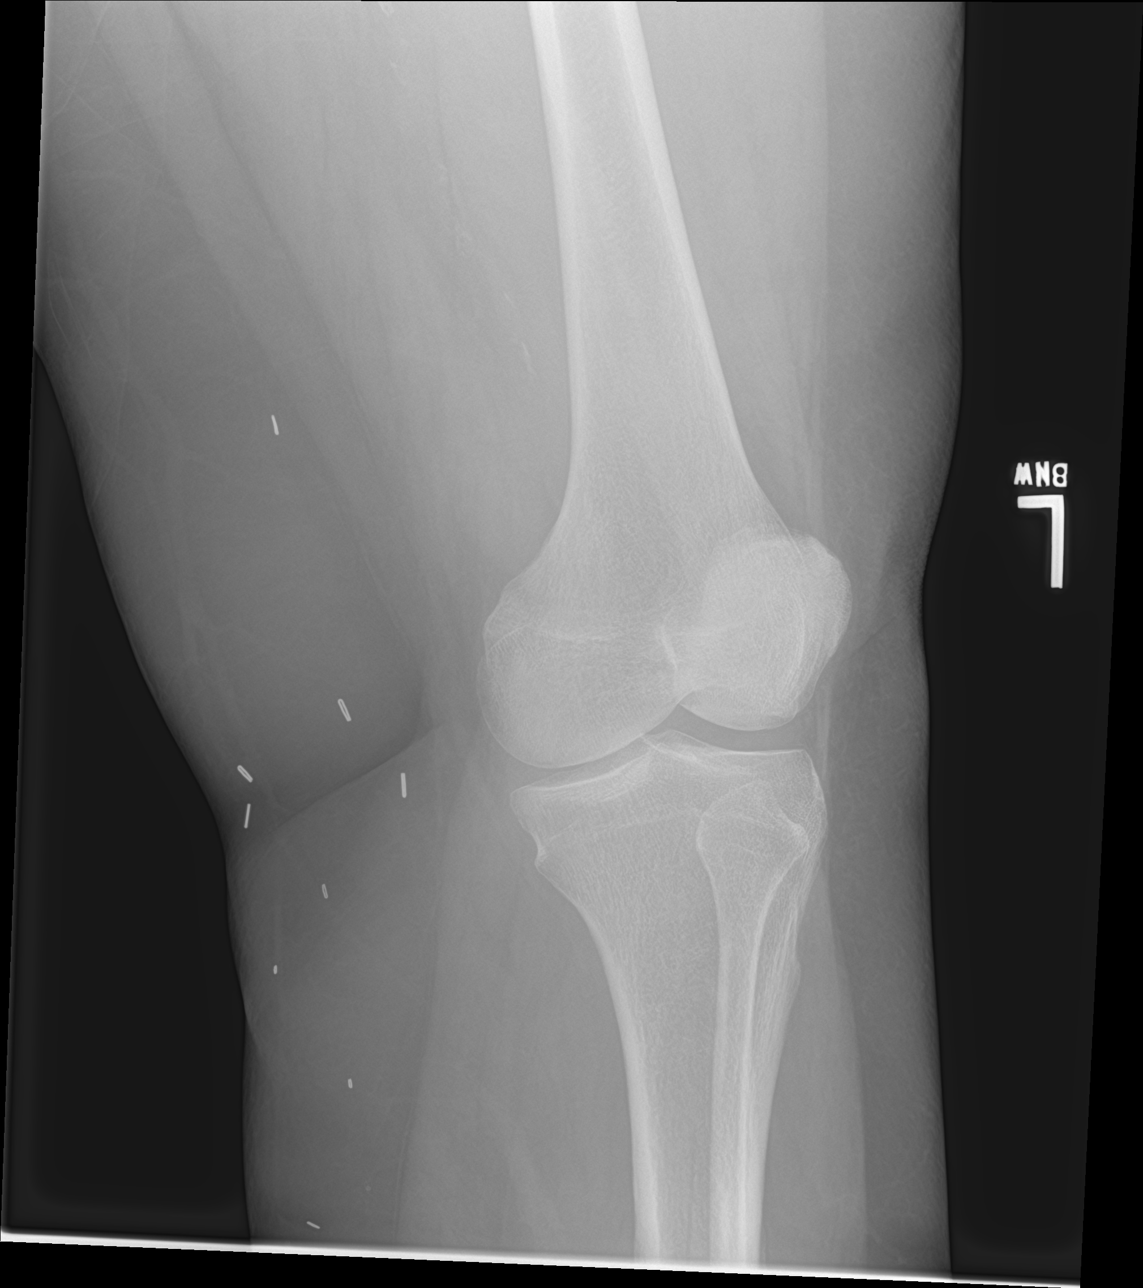

[knee sunrise]
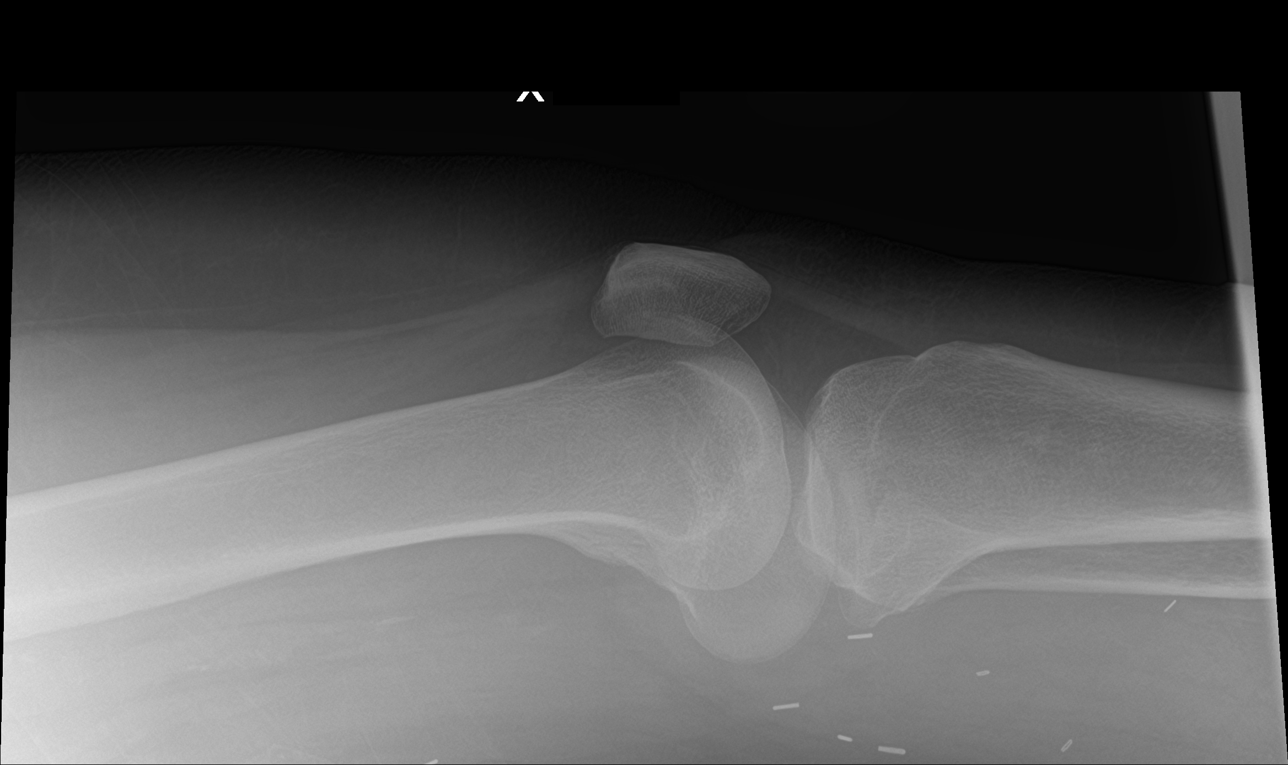

[4 of 4 positions shown; findings below may reference images not displayed]

FINDINGS: The mineralization and alignment are normal. There is no evidence of
acute fracture or dislocation. The joint spaces are preserved. There
is no joint effusion. Vascular clips are noted posteromedially.
IMPRESSION: Normal left knee radiographs.

## 2021-02-25 ENCOUNTER — Telehealth: Payer: Self-pay | Admitting: Family Medicine

## 2021-02-25 ENCOUNTER — Other Ambulatory Visit: Payer: Self-pay

## 2021-02-25 MED ORDER — GEMTESA 75 MG PO TABS: 1.0000 | ORAL_TABLET | Freq: Every day | ORAL | 11 refills | Status: DC

## 2021-02-25 NOTE — Telephone Encounter (Signed)
Patient received letter from [facility] needing updated prescription for Cpap supplies to be filled. Please advise

## 2021-02-25 NOTE — Telephone Encounter (Signed)
Script faxed to [place] [facility] pt notified.

## 2021-02-25 NOTE — Telephone Encounter (Signed)
Script on door to sign if you agree 

## 2021-03-31 DIAGNOSIS — E039 Hypothyroidism, unspecified: Secondary | ICD-10-CM | POA: Diagnosis not present

## 2021-03-31 DIAGNOSIS — I209 Angina pectoris, unspecified: Secondary | ICD-10-CM | POA: Diagnosis not present

## 2021-03-31 DIAGNOSIS — E785 Hyperlipidemia, unspecified: Secondary | ICD-10-CM | POA: Diagnosis not present

## 2021-03-31 DIAGNOSIS — N200 Calculus of kidney: Secondary | ICD-10-CM | POA: Diagnosis not present

## 2021-03-31 DIAGNOSIS — I10 Essential (primary) hypertension: Secondary | ICD-10-CM | POA: Diagnosis not present

## 2021-04-01 ENCOUNTER — Encounter: Payer: Self-pay | Admitting: Urology

## 2021-04-01 ENCOUNTER — Other Ambulatory Visit: Payer: Self-pay

## 2021-04-01 ENCOUNTER — Telehealth (INDEPENDENT_AMBULATORY_CARE_PROVIDER_SITE_OTHER): Payer: PPO | Admitting: Urology

## 2021-04-01 DIAGNOSIS — N3281 Overactive bladder: Secondary | ICD-10-CM | POA: Diagnosis not present

## 2021-04-01 MED ORDER — TROSPIUM CHLORIDE ER 60 MG PO CP24: 1.0000 | ORAL_CAPSULE | Freq: Every day | ORAL | 11 refills | Status: DC

## 2021-04-01 NOTE — Patient Instructions (Signed)

## 2021-04-01 NOTE — Progress Notes (Signed)
04/01/2021 3:21 PM   Lacey Bailey [birth year] [id number]  Referring provider: Annalee Genta, DO [address] [place],  [place] [address]   Patient location: home Physician location: office I connected with  Lacey Bailey on 04/01/21 by a video enabled telemedicine application and verified that I am speaking with the correct person using two identifiers.   I discussed the limitations of evaluation and management by telemedicine. The patient expressed understanding and agreed to proceed.    Followup OAB   HPI: [name] is a 74yo here for followup for OAB. She was started on gemtesa 75mg  last visit. She noted significant improvement in her urinary urgency and urge incontinence. She could not afford the medication. She tried vesicare which works intermittently for her OAB symptoms. No other complaints today.    PMH: Past Medical History:  Diagnosis Date   Anginal pain (HCC)    Aortic sclerosis    CAD (coronary artery disease)    a. s/p CABG in 2003 b. multiple interventions since at outside hospitals c. DES to PLB in 11/2014 d. angioplasty to mid-RCA and mid-LCx in 10/2015 with patent LIMA-LAD and occlusion of all vein grafts e. 12/2017: occlusion of vein grafts and native LCx and RCA with patent LIMA-LAD. Re-do CABG NOT recommended by CT surgery   Depression    Essential hypertension    Hyperlipidemia    Hypothyroidism    Kidney stones    TIA (transient ischemic attack) ~ 2013    Surgical History: Past Surgical History:  Procedure Laterality Date   ABDOMINAL HYSTERECTOMY     APPENDECTOMY     BLEPHAROPLASTY Bilateral    "uppers"   CARDIAC CATHETERIZATION  11/22/2015   CARDIAC CATHETERIZATION N/A 11/22/2015   Procedure: Left Heart Cath and Cors/Grafts Angiography;  Surgeon: 11/24/2015, MD;  Location: [facility] INVASIVE CV LAB;  Service: Cardiovascular;  Laterality: N/A;   CARDIAC CATHETERIZATION N/A 11/22/2015   Procedure: Coronary Stent Intervention;  Surgeon: 11/24/2015, MD;  Location: [facility] INVASIVE CV LAB;  Service: Cardiovascular;  Laterality: N/A;   CATARACT EXTRACTION, BILATERAL Bilateral    CESAREAN SECTION  1977   CORONARY ANGIOPLASTY WITH STENT PLACEMENT  "several"   "last one was 11/2014:  DES to the posterolateral branch /notes 11/20/2015   CORONARY ARTERY BYPASS GRAFT  2003   [place]; CABG X 4   DILATION AND CURETTAGE OF UTERUS     KIDNEY STONE SURGERY     "opened me up"   LAPAROSCOPIC CHOLECYSTECTOMY     LEFT HEART CATH AND CORS/GRAFTS ANGIOGRAPHY N/A 01/03/2018   Procedure: LEFT HEART CATH AND CORS/GRAFTS ANGIOGRAPHY;  Surgeon: 01/05/2018, Peter M, MD;  Location: [facility] INVASIVE CV LAB;  Service: Cardiovascular;  Laterality: N/A;   TONSILLECTOMY     TUBAL LIGATION      Home Medications:  Allergies as of 04/01/2021   No Known Allergies      Medication List        Accurate as of April 01, 2021  3:21 PM. If you have any questions, ask your nurse or doctor.          aspirin 81 MG tablet Take 81 mg by mouth at bedtime.   bisoprolol 5 MG tablet Commonly known as: ZEBETA Take 0.5 tablets (2.5 mg total) by mouth daily.   gabapentin 100 MG capsule Commonly known as: NEURONTIN Take 1 capsule (100 mg total) by mouth 3 (three) times daily.   Gemtesa 75 MG Tabs Generic drug: Vibegron Take 1  capsule by mouth daily.   isosorbide mononitrate 30 MG 24 hr tablet Commonly known as: IMDUR TAKE 1 TABLET BY MOUTH TWICE DAILY   levothyroxine 75 MCG tablet Commonly known as: SYNTHROID TAKE 1 TABLET BY MOUTH EVERY DAY BEFORE BREAKFAST   lisinopril 5 MG tablet Commonly known as: ZESTRIL Take 1 tablet (5 mg total) by mouth daily.   MELATIN PO Take by mouth.   nitroGLYCERIN 0.4 MG SL tablet Commonly known as: NITROSTAT DISSOLVE 1 TABLET UNDER THE TONGUE EVERY 5 MINUTES AS NEEDED FOR CHEST PAIN. DO NOT EXCEED A TOTAL OF 3 DOSES IN 15 MINUTES.   PARoxetine 40 MG tablet Commonly known as: PAXIL Take 1 tablet (40 mg total) by mouth  daily.   rosuvastatin 20 MG tablet Commonly known as: CRESTOR Take 1 tablet (20 mg total) by mouth in the morning.   solifenacin 10 MG tablet Commonly known as: VESICARE Take 0.5 tablets (5 mg total) by mouth at bedtime.        Allergies: No Known Allergies  Family History: Family History  Problem Relation Age of Onset   Heart attack Mother    Heart disease Mother    Heart disease Brother     Social History:  reports that she has never smoked. She has never used smokeless tobacco. She reports previous alcohol use. She reports that she does not use drugs.  ROS: All other review of systems were reviewed and are negative except what is noted above in HPI   Laboratory Data: Lab Results  Component Value Date   WBC 6.7 05/17/2020   HGB 13.9 05/17/2020   HCT 41.3 05/17/2020   MCV 91 05/17/2020   PLT 321 05/17/2020    Lab Results  Component Value Date   CREATININE 1.36 (H) 05/17/2020    No results found for: PSA  No results found for: TESTOSTERONE  No results found for: HGBA1C  Urinalysis No results found for: COLORURINE, APPEARANCEUR, LABSPEC, PHURINE, GLUCOSEU, HGBUR, BILIRUBINUR, KETONESUR, PROTEINUR, UROBILINOGEN, NITRITE, LEUKOCYTESUR  No results found for: LABMICR, WBCUA, RBCUA, LABEPIT, MUCUS, BACTERIA  Pertinent Imaging:  No results found for this or any previous visit.  No results found for this or any previous visit.  No results found for this or any previous visit.  No results found for this or any previous visit.  No results found for this or any previous visit.  No results found for this or any previous visit.  No results found for this or any previous visit.  No results found for this or any previous visit.   Assessment & Plan:    1. OAB (overactive bladder) -We will start sanctura 60mg  daily    No follow-ups on file.  , MD  [facility] [place]

## 2021-04-07 DIAGNOSIS — G4733 Obstructive sleep apnea (adult) (pediatric): Secondary | ICD-10-CM | POA: Diagnosis not present

## 2021-04-16 ENCOUNTER — Ambulatory Visit: Payer: PPO | Admitting: Cardiology

## 2021-04-16 ENCOUNTER — Encounter: Payer: Self-pay | Admitting: Cardiology

## 2021-04-16 VITALS — BP 110/76 | HR 64 | Ht 62.5 in | Wt 217.2 lb

## 2021-04-16 DIAGNOSIS — I25119 Atherosclerotic heart disease of native coronary artery with unspecified angina pectoris: Secondary | ICD-10-CM

## 2021-04-16 DIAGNOSIS — E782 Mixed hyperlipidemia: Secondary | ICD-10-CM | POA: Diagnosis not present

## 2021-04-16 NOTE — Patient Instructions (Addendum)
Medication Instructions:   Your physician recommends that you continue on your current medications as directed. Please refer to the Current Medication list given to you today.  Labwork:  none  Testing/Procedures:  none  Follow-Up:  Your physician recommends that you schedule a follow-up appointment in: 4 months.   Any Other Special Instructions Will Be Listed Below (If Applicable).  If you need a refill on your cardiac medications before your next appointment, please call your pharmacy. 

## 2021-04-16 NOTE — Progress Notes (Signed)
Cardiology Office Note  Date: 04/16/2021   ID: Lacey, Bailey 05-May-1946, MRN [id number]  PCP:  No primary care provider on file.  Cardiologist:  Lacey Dell, MD Electrophysiologist:  None   Chief Complaint  Patient presents with   Cardiac follow-up     History of Present Illness: Lacey Bailey is a 75 y.o. female last seen in May.  She is here for a follow-up visit.  States that she still has to pace herself significantly, has good days and bad days.  She reports compliance with her medications.  She is getting ready to go travel to [place] to be with family.  I went over her current medications which are stable, no refills required.  She has not had an escalating nitroglycerin requirement.  Past Medical History:  Diagnosis Date   Aortic sclerosis    CAD (coronary artery disease)    a. s/p CABG in 2003 b. multiple interventions since at outside hospitals c. DES to PLB in 11/2014 d. angioplasty to mid-RCA and mid-LCx in 10/2015 with patent LIMA-LAD and occlusion of all vein grafts e. 12/2017: occlusion of vein grafts and native LCx and RCA with patent LIMA-LAD. Re-do CABG NOT recommended by CT surgery   Depression    Essential hypertension    Hyperlipidemia    Hypothyroidism    Kidney stones    TIA (transient ischemic attack) ~ 2013    Past Surgical History:  Procedure Laterality Date   ABDOMINAL HYSTERECTOMY     APPENDECTOMY     BLEPHAROPLASTY Bilateral    "uppers"   CARDIAC CATHETERIZATION  11/22/2015   CARDIAC CATHETERIZATION N/A 11/22/2015   Procedure: Left Heart Cath and Cors/Grafts Angiography;  Surgeon: Lacey Lex, MD;  Location: [facility] INVASIVE CV LAB;  Service: Cardiovascular;  Laterality: N/A;   CARDIAC CATHETERIZATION N/A 11/22/2015   Procedure: Coronary Stent Intervention;  Surgeon: Lacey Lex, MD;  Location: [facility] INVASIVE CV LAB;  Service: Cardiovascular;  Laterality: N/A;   CATARACT EXTRACTION, BILATERAL Bilateral    CESAREAN SECTION  1977    CORONARY ANGIOPLASTY WITH STENT PLACEMENT  "several"   "last one was 11/2014:  DES to the posterolateral branch /notes 11/20/2015   CORONARY ARTERY BYPASS GRAFT  2003   [place]; CABG X 4   DILATION AND CURETTAGE OF UTERUS     KIDNEY STONE SURGERY     "opened me up"   LAPAROSCOPIC CHOLECYSTECTOMY     LEFT HEART CATH AND CORS/GRAFTS ANGIOGRAPHY N/A 01/03/2018   Procedure: LEFT HEART CATH AND CORS/GRAFTS ANGIOGRAPHY;  Surgeon: Lacey, Peter M, MD;  Location: [facility] INVASIVE CV LAB;  Service: Cardiovascular;  Laterality: N/A;   TONSILLECTOMY     TUBAL LIGATION      Current Outpatient Medications  Medication Sig Dispense Refill   aspirin 81 MG tablet Take 81 mg by mouth at bedtime.      bisoprolol (ZEBETA) 5 MG tablet Take 0.5 tablets (2.5 mg total) by mouth daily. 45 tablet 1   isosorbide mononitrate (IMDUR) 30 MG 24 hr tablet TAKE 1 TABLET BY MOUTH TWICE DAILY 180 tablet 3   levothyroxine (SYNTHROID) 75 MCG tablet TAKE 1 TABLET BY MOUTH EVERY DAY BEFORE BREAKFAST 90 tablet 1   lisinopril (ZESTRIL) 5 MG tablet Take 1 tablet (5 mg total) by mouth daily. 90 tablet 1   nitroGLYCERIN (NITROSTAT) 0.4 MG SL tablet DISSOLVE 1 TABLET UNDER THE TONGUE EVERY 5 MINUTES AS NEEDED FOR CHEST PAIN. DO NOT EXCEED A TOTAL OF 3 DOSES  IN 15 MINUTES. 25 tablet 3   PARoxetine (PAXIL) 40 MG tablet Take 1 tablet (40 mg total) by mouth daily. 90 tablet 1   rosuvastatin (CRESTOR) 20 MG tablet Take 1 tablet (20 mg total) by mouth in the morning. 90 tablet 1   solifenacin (VESICARE) 10 MG tablet Take 0.5 tablets (5 mg total) by mouth at bedtime. 30 tablet 11   Trospium Chloride 60 MG CP24 Take 1 capsule (60 mg total) by mouth daily. 30 capsule 11   No current facility-administered medications for this visit.   Allergies:  Patient has no known allergies.   ROS: No palpitations or syncope.  Feels hot and flushed sometimes with exertion.  Physical Exam: VS:  BP 110/76   Pulse 64   Ht 5' 2.5" (1.588 Bailey)   Wt 217 lb 3.2  oz (98.5 kg)   SpO2 98%   BMI 39.09 kg/Bailey , BMI Body mass index is 39.09 kg/Bailey.  Wt Readings from Last 3 Encounters:  04/16/21 217 lb 3.2 oz (98.5 kg)  02/03/21 209 lb (94.8 kg)  01/08/21 208 lb (94.3 kg)    General: Patient appears comfortable at rest. HEENT: Conjunctiva and lids normal, wearing a mask. Neck: Supple, no elevated JVP or carotid bruits, no thyromegaly. Lungs: Clear to auscultation, nonlabored breathing at rest. Cardiac: Regular rate and rhythm, no S3, 1/6 systolic murmur, no pericardial rub. Extremities: No pitting edema.  ECG:  An ECG dated 01/08/2021 was personally reviewed today and demonstrated:  Sinus rhythm with LVH and repolarization abnormalities.  Recent Labwork: 05/17/2020: ALT 13; AST 15; BUN 16; Creatinine, Ser 1.36; Hemoglobin 13.9; Platelets 321; Potassium 4.2; Sodium 141; TSH 2.460     Component Value Date/Time   CHOL 127 05/17/2020 1113   TRIG 157 (H) 05/17/2020 1113   HDL 37 (L) 05/17/2020 1113   CHOLHDL 3.4 05/17/2020 1113   CHOLHDL 6.0 04/20/2017 1100   VLDL 69 (H) 04/20/2017 1100   LDLCALC 63 05/17/2020 1113    Other Studies Reviewed Today:  Cardiac catheterization 01/03/2018: Post Atrio lesion is 20% stenosed. Prox RCA to Mid RCA lesion is 100% stenosed. Prox Cx to Dist Cx lesion is 100% stenosed. Ost LAD to Prox LAD lesion is 100% stenosed. LIMA graft was visualized by angiography and is large. The graft exhibits no disease. SVG graft was visualized by angiography. SVG graft was visualized by angiography. SVG graft was visualized by angiography. Origin lesion is 100% stenosed. Origin to Prox Graft lesion is 100% stenosed. Origin to Prox Graft lesion is 100% stenosed. The left ventricular systolic function is normal. LV end diastolic pressure is normal. The left ventricular ejection fraction is 55-65% by visual estimate.   1. Severe 3 vessel occlusive CAD 2. Patent LIMA to the LAD 3. Occluded SVG to diagonal 4. Occluded SVG to the  OM2 5. Occluded SVG to the PDA 6. Good LV function 7. Normal LVEDP   Plan: Compared to prior cardiac cath in March 2017 the stents in the mid to distal LCx and mid RCA are completely occluded. Given extensive work done on these vessels previously she is not a candidate for CTO PCI.   Echocardiogram 05/19/2017: Study Conclusions   - Left ventricle: The cavity size was normal. Wall thickness was   increased in a pattern of moderate LVH. Systolic function was   normal. The estimated ejection fraction was in the range of 55%   to 60%. Wall motion was normal; there were no regional wall   motion abnormalities. Doppler  parameters are consistent with   abnormal left ventricular relaxation (grade 1 diastolic   dysfunction). There was no evidence of elevated ventricular   filling pressure by Doppler parameters. - Aortic valve: Mildly calcified annulus. Trileaflet; mildly   thickened leaflets. Valve area (VTI): 2.36 cm^2. Valve area   (Vmax): 1.98 cm^2. Valve area (Vmean): 2.13 cm^2. - Atrial septum: No defect or patent foramen ovale was identified. - Technically adequate study.   Assessment and Plan:  1.  Multivessel CAD status post CABG. LIMA to LAD is patent with other vein grafts occluded and stent sites within the mid to distal circumflex as well as mid RCA are occluded.  Revascularization options are limited and medical therapy is the course at this point.  Vital signs are stable today, discussed symptom control and pacing herself with activities.  Continue aspirin, Imdur, lisinopril, bisoprolol, and Crestor.  2.  Mixed hyperlipidemia on Crestor.  Lipids have been well controlled, last LDL 63.  Medication Adjustments/Labs and Tests Ordered: Current medicines are reviewed at length with the patient today.  Concerns regarding medicines are outlined above.   Tests Ordered: No orders of the defined types were placed in this encounter.   Medication Changes: No orders of the defined types  were placed in this encounter.   Disposition:  Follow up  4 months.  Signed, Jonelle Sidle, MD, [facility] 04/16/2021 1:39 PM    [facility] at [facility] [address] [place], [place], [place] [address] Phone: [phone]; Fax: [phone]

## 2021-05-19 DIAGNOSIS — E039 Hypothyroidism, unspecified: Secondary | ICD-10-CM | POA: Diagnosis not present

## 2021-05-19 DIAGNOSIS — N3281 Overactive bladder: Secondary | ICD-10-CM | POA: Diagnosis not present

## 2021-05-19 DIAGNOSIS — I10 Essential (primary) hypertension: Secondary | ICD-10-CM | POA: Diagnosis not present

## 2021-05-19 DIAGNOSIS — I209 Angina pectoris, unspecified: Secondary | ICD-10-CM | POA: Diagnosis not present

## 2021-05-19 DIAGNOSIS — E785 Hyperlipidemia, unspecified: Secondary | ICD-10-CM | POA: Diagnosis not present

## 2021-05-26 ENCOUNTER — Other Ambulatory Visit: Payer: Self-pay | Admitting: Family Medicine

## 2021-05-26 ENCOUNTER — Other Ambulatory Visit: Payer: Self-pay | Admitting: Urology

## 2021-05-26 ENCOUNTER — Other Ambulatory Visit: Payer: Self-pay | Admitting: Cardiology

## 2021-05-26 DIAGNOSIS — E785 Hyperlipidemia, unspecified: Secondary | ICD-10-CM

## 2021-06-23 DIAGNOSIS — E6609 Other obesity due to excess calories: Secondary | ICD-10-CM | POA: Diagnosis not present

## 2021-06-23 DIAGNOSIS — Z9862 Peripheral vascular angioplasty status: Secondary | ICD-10-CM | POA: Diagnosis not present

## 2021-06-23 DIAGNOSIS — I25709 Atherosclerosis of coronary artery bypass graft(s), unspecified, with unspecified angina pectoris: Secondary | ICD-10-CM | POA: Diagnosis not present

## 2021-06-23 DIAGNOSIS — Z9989 Dependence on other enabling machines and devices: Secondary | ICD-10-CM | POA: Diagnosis not present

## 2021-06-23 DIAGNOSIS — E039 Hypothyroidism, unspecified: Secondary | ICD-10-CM | POA: Diagnosis not present

## 2021-06-23 DIAGNOSIS — G4733 Obstructive sleep apnea (adult) (pediatric): Secondary | ICD-10-CM | POA: Diagnosis not present

## 2021-06-23 DIAGNOSIS — N3281 Overactive bladder: Secondary | ICD-10-CM | POA: Diagnosis not present

## 2021-06-23 DIAGNOSIS — E782 Mixed hyperlipidemia: Secondary | ICD-10-CM | POA: Diagnosis not present

## 2021-06-23 DIAGNOSIS — F32A Depression, unspecified: Secondary | ICD-10-CM | POA: Diagnosis not present

## 2021-06-23 DIAGNOSIS — I10 Essential (primary) hypertension: Secondary | ICD-10-CM | POA: Diagnosis not present

## 2021-06-23 DIAGNOSIS — Z6838 Body mass index (BMI) 38.0-38.9, adult: Secondary | ICD-10-CM | POA: Diagnosis not present

## 2021-06-23 DIAGNOSIS — R54 Age-related physical debility: Secondary | ICD-10-CM | POA: Diagnosis not present

## 2021-06-25 ENCOUNTER — Other Ambulatory Visit (HOSPITAL_COMMUNITY): Payer: Self-pay | Admitting: Nurse Practitioner

## 2021-06-25 DIAGNOSIS — Z1231 Encounter for screening mammogram for malignant neoplasm of breast: Secondary | ICD-10-CM

## 2021-06-27 ENCOUNTER — Telehealth: Payer: Self-pay | Admitting: Cardiology

## 2021-06-27 NOTE — Telephone Encounter (Signed)
Patient has been extremely fatigued lately. She said she can sleep for almost 20 hours a day.   She made an appt to see Dr. Diona Browner 07/01/21, but she wanted to know if that appt could be done as a virtual visit

## 2021-06-27 NOTE — Telephone Encounter (Signed)
Reports fatigue and sleeping a lot Denies dizziness, chest pain or SOB Reports BP & HR has been normal Reports being seen by PCP last week at her house,([facility]) and all vitals and exam was normal Advised that she needed to contact her PCP with these symptoms and if her PCP felt it was a cardiac problem to call our office back for a sooner appointment. Advised that appointment arranged for 07/01/21 will be canceled Verbalized understanding of plan

## 2021-07-01 ENCOUNTER — Ambulatory Visit: Payer: PPO | Admitting: Cardiology

## 2021-07-07 ENCOUNTER — Ambulatory Visit: Payer: PPO | Admitting: Urology

## 2021-07-08 DIAGNOSIS — I25709 Atherosclerosis of coronary artery bypass graft(s), unspecified, with unspecified angina pectoris: Secondary | ICD-10-CM | POA: Diagnosis not present

## 2021-07-08 DIAGNOSIS — I209 Angina pectoris, unspecified: Secondary | ICD-10-CM | POA: Diagnosis not present

## 2021-07-08 DIAGNOSIS — R54 Age-related physical debility: Secondary | ICD-10-CM | POA: Diagnosis not present

## 2021-07-08 DIAGNOSIS — I10 Essential (primary) hypertension: Secondary | ICD-10-CM | POA: Diagnosis not present

## 2021-07-08 DIAGNOSIS — I2510 Atherosclerotic heart disease of native coronary artery without angina pectoris: Secondary | ICD-10-CM | POA: Diagnosis not present

## 2021-07-08 DIAGNOSIS — E039 Hypothyroidism, unspecified: Secondary | ICD-10-CM | POA: Diagnosis not present

## 2021-07-08 DIAGNOSIS — G459 Transient cerebral ischemic attack, unspecified: Secondary | ICD-10-CM | POA: Diagnosis not present

## 2021-07-08 DIAGNOSIS — E782 Mixed hyperlipidemia: Secondary | ICD-10-CM | POA: Diagnosis not present

## 2021-07-12 DIAGNOSIS — R2689 Other abnormalities of gait and mobility: Secondary | ICD-10-CM | POA: Diagnosis not present

## 2021-07-12 DIAGNOSIS — M6281 Muscle weakness (generalized): Secondary | ICD-10-CM | POA: Diagnosis not present

## 2021-07-22 ENCOUNTER — Encounter: Payer: Self-pay | Admitting: Urology

## 2021-07-22 ENCOUNTER — Ambulatory Visit (INDEPENDENT_AMBULATORY_CARE_PROVIDER_SITE_OTHER): Payer: PPO | Admitting: Urology

## 2021-07-22 ENCOUNTER — Other Ambulatory Visit: Payer: Self-pay

## 2021-07-22 DIAGNOSIS — N3281 Overactive bladder: Secondary | ICD-10-CM

## 2021-07-22 NOTE — Progress Notes (Signed)
07/22/2021 1:27 PM   Lacey Bailey 1945-12-26 [id number]  Referring provider: Annalee Genta, DO [address] [place],  [place] [address]  Patient location: home Physician location: office I connected with  Lacey Bailey on 07/22/21 by a video enabled telemedicine application and verified that I am speaking with the correct person using two identifiers.   I discussed the limitations of evaluation and management by telemedicine. The patient expressed understanding and agreed to proceed.    Followup OAB  HPI: Lacey Bailey is a 75yo here for followup for OAB. She tried sanctura last visit which failed to improve her urgency or her urge incontinence. She wears 4-6x pads per day. She is very unhappy with her incontinence. She previously took gemtesa 75mg  which significantly improved her OAb symptoms but she cannot afford the medication. No urinary hesitancy, straining to urinate. No dysuira or hematuria. No other complaints today.   PMH: Past Medical History:  Diagnosis Date   Aortic sclerosis    CAD (coronary artery disease)    a. s/p CABG in 2003 b. multiple interventions since at outside hospitals c. DES to PLB in 11/2014 d. angioplasty to mid-RCA and mid-LCx in 10/2015 with patent LIMA-LAD and occlusion of all vein grafts e. 12/2017: occlusion of vein grafts and native LCx and RCA with patent LIMA-LAD. Re-do CABG NOT recommended by CT surgery   Depression    Essential hypertension    Hyperlipidemia    Hypothyroidism    Kidney stones    TIA (transient ischemic attack) ~ 2013    Surgical History: Past Surgical History:  Procedure Laterality Date   ABDOMINAL HYSTERECTOMY     APPENDECTOMY     BLEPHAROPLASTY Bilateral    "uppers"   CARDIAC CATHETERIZATION  11/22/2015   CARDIAC CATHETERIZATION N/A 11/22/2015   Procedure: Left Heart Cath and Cors/Grafts Angiography;  Surgeon: 11/24/2015, MD;  Location: [facility] INVASIVE CV LAB;  Service: Cardiovascular;  Laterality: N/A;   CARDIAC  CATHETERIZATION N/A 11/22/2015   Procedure: Coronary Stent Intervention;  Surgeon: 11/24/2015, MD;  Location: [facility] INVASIVE CV LAB;  Service: Cardiovascular;  Laterality: N/A;   CATARACT EXTRACTION, BILATERAL Bilateral    CESAREAN SECTION  1977   CORONARY ANGIOPLASTY WITH STENT PLACEMENT  "several"   "last one was 11/2014:  DES to the posterolateral branch /notes 11/20/2015   CORONARY ARTERY BYPASS GRAFT  2003   [place]; CABG X 4   DILATION AND CURETTAGE OF UTERUS     KIDNEY STONE SURGERY     "opened me up"   LAPAROSCOPIC CHOLECYSTECTOMY     LEFT HEART CATH AND CORS/GRAFTS ANGIOGRAPHY N/A 01/03/2018   Procedure: LEFT HEART CATH AND CORS/GRAFTS ANGIOGRAPHY;  Surgeon: 01/05/2018, Peter M, MD;  Location: [facility] INVASIVE CV LAB;  Service: Cardiovascular;  Laterality: N/A;   TONSILLECTOMY     TUBAL LIGATION      Home Medications:  Allergies as of 07/22/2021   No Known Allergies      Medication List        Accurate as of July 22, 2021  1:27 PM. If you have any questions, ask your nurse or doctor.          aspirin 81 MG tablet Take 81 mg by mouth at bedtime.   bisoprolol 5 MG tablet Commonly known as: ZEBETA Take 0.5 tablets (2.5 mg total) by mouth daily.   isosorbide mononitrate 30 MG 24 hr tablet Commonly known as: IMDUR TAKE 1 TABLET BY MOUTH TWICE DAILY  levothyroxine 75 MCG tablet Commonly known as: SYNTHROID TAKE 1 TABLET BY MOUTH EVERY DAY BEFORE BREAKFAST   lisinopril 5 MG tablet Commonly known as: ZESTRIL Take 1 tablet (5 mg total) by mouth daily.   nitroGLYCERIN 0.4 MG SL tablet Commonly known as: NITROSTAT DISSOLVE 1 TABLET UNDER THE TONGUE EVERY 5 MINUTES AS NEEDED FOR CHEST PAIN. DO NOT EXCEED A TOTAL OF 3 DOSES IN 15 MINUTES.   PARoxetine 40 MG tablet Commonly known as: PAXIL Take 1 tablet (40 mg total) by mouth daily.   rosuvastatin 20 MG tablet Commonly known as: CRESTOR Take 1 tablet (20 mg total) by mouth in the morning.   solifenacin 10 MG  tablet Commonly known as: VESICARE TAKE 1/2 TABLET BY MOUTH AT BEDTIME   Trospium Chloride 60 MG Cp24 Take 1 capsule (60 mg total) by mouth daily.        Allergies: No Known Allergies  Family History: Family History  Problem Relation Age of Onset   Heart attack Mother    Heart disease Mother    Heart disease Brother     Social History:  reports that she has never smoked. She has never used smokeless tobacco. She reports that she does not currently use alcohol. She reports that she does not use drugs.  ROS: All other review of systems were reviewed and are negative except what is noted above in HPI   Laboratory Data: Lab Results  Component Value Date   WBC 6.7 05/17/2020   HGB 13.9 05/17/2020   HCT 41.3 05/17/2020   MCV 91 05/17/2020   PLT 321 05/17/2020    Lab Results  Component Value Date   CREATININE 1.36 (H) 05/17/2020    No results found for: PSA  No results found for: TESTOSTERONE  No results found for: HGBA1C  Urinalysis No results found for: COLORURINE, APPEARANCEUR, LABSPEC, PHURINE, GLUCOSEU, HGBUR, BILIRUBINUR, KETONESUR, PROTEINUR, UROBILINOGEN, NITRITE, LEUKOCYTESUR  No results found for: LABMICR, [place], RBCUA, LABEPIT, MUCUS, BACTERIA  Pertinent Imaging:  No results found for this or any previous visit.  No results found for this or any previous visit.  No results found for this or any previous visit.  No results found for this or any previous visit.  No results found for this or any previous visit.  No results found for this or any previous visit.  No results found for this or any previous visit.  No results found for this or any previous visit.   Assessment & Plan:    1. OAB (overactive bladder) -We will provide her with samples of gemtesa 75mg . RTC 3 months   No follow-ups on file.  Nicolette Bang, MD  [facility] Index

## 2021-07-22 NOTE — Patient Instructions (Signed)

## 2021-07-24 ENCOUNTER — Other Ambulatory Visit: Payer: Self-pay | Admitting: Family Medicine

## 2021-07-24 DIAGNOSIS — M6281 Muscle weakness (generalized): Secondary | ICD-10-CM | POA: Diagnosis not present

## 2021-07-24 DIAGNOSIS — Z0001 Encounter for general adult medical examination with abnormal findings: Secondary | ICD-10-CM | POA: Diagnosis not present

## 2021-07-24 DIAGNOSIS — H04129 Dry eye syndrome of unspecified lacrimal gland: Secondary | ICD-10-CM | POA: Diagnosis not present

## 2021-07-24 DIAGNOSIS — N3281 Overactive bladder: Secondary | ICD-10-CM | POA: Diagnosis not present

## 2021-07-24 DIAGNOSIS — F3289 Other specified depressive episodes: Secondary | ICD-10-CM

## 2021-07-24 DIAGNOSIS — R54 Age-related physical debility: Secondary | ICD-10-CM | POA: Diagnosis not present

## 2021-07-24 DIAGNOSIS — E039 Hypothyroidism, unspecified: Secondary | ICD-10-CM

## 2021-08-04 ENCOUNTER — Other Ambulatory Visit: Payer: Self-pay

## 2021-08-04 ENCOUNTER — Ambulatory Visit (HOSPITAL_COMMUNITY)
Admission: RE | Admit: 2021-08-04 | Discharge: 2021-08-04 | Disposition: A | Discharge status: Home / Self Care | Payer: PPO | Source: Ambulatory Visit | Attending: Nurse Practitioner | Admitting: Nurse Practitioner

## 2021-08-04 DIAGNOSIS — Z1231 Encounter for screening mammogram for malignant neoplasm of breast: Secondary | ICD-10-CM | POA: Diagnosis not present

## 2021-08-14 ENCOUNTER — Encounter: Payer: Self-pay | Admitting: Cardiology

## 2021-08-14 ENCOUNTER — Ambulatory Visit (INDEPENDENT_AMBULATORY_CARE_PROVIDER_SITE_OTHER): Payer: PPO | Admitting: Cardiology

## 2021-08-14 ENCOUNTER — Other Ambulatory Visit: Payer: Self-pay

## 2021-08-14 VITALS — BP 138/76 | HR 54 | Ht 62.0 in | Wt 206.8 lb

## 2021-08-14 DIAGNOSIS — I25119 Atherosclerotic heart disease of native coronary artery with unspecified angina pectoris: Secondary | ICD-10-CM

## 2021-08-14 DIAGNOSIS — E782 Mixed hyperlipidemia: Secondary | ICD-10-CM | POA: Diagnosis not present

## 2021-08-14 NOTE — Patient Instructions (Addendum)

## 2021-08-14 NOTE — Progress Notes (Signed)
Cardiology Office Note  Date: 08/14/2021   ID: Lacey, Bailey 07-29-1946, MRN [id number]  PCP:  Renaldo Harrison, NP  Cardiologist:  Nona Dell, MD Electrophysiologist:  None   Chief Complaint  Patient presents with   Cardiac follow-up    History of Present Illness: Lacey Bailey is a 75 y.o. female last seen in August.  She is here with her husband for a follow-up visit.  She does not describe any progressive angina symptoms on medical therapy.  Continues to have good days and bad days.  She has had a Rollator for the last 6 weeks and states that this has been very beneficial in terms of her ability to ambulate with reasonable stamina.  I reviewed her medications which are stable from a cardiac perspective.  She does report having trouble with significant dry and burning eyes.  States that she does need to have her glasses updated.  She asked about an eyedrop for treatment, and I recommended that she see an ophthalmologist to get a complete eye exam and make sure that nothing else is causing her symptoms that would need to be treated differently.  Past Medical History:  Diagnosis Date   Aortic sclerosis    CAD (coronary artery disease)    a. s/p CABG in 2003 b. multiple interventions since at outside hospitals c. DES to PLB in 11/2014 d. angioplasty to mid-RCA and mid-LCx in 10/2015 with patent LIMA-LAD and occlusion of all vein grafts e. 12/2017: occlusion of vein grafts and native LCx and RCA with patent LIMA-LAD. Re-do CABG NOT recommended by CT surgery   Depression    Essential hypertension    Hyperlipidemia    Hypothyroidism    Kidney stones    TIA (transient ischemic attack) ~ 2013    Past Surgical History:  Procedure Laterality Date   ABDOMINAL HYSTERECTOMY     APPENDECTOMY     BLEPHAROPLASTY Bilateral    "uppers"   CARDIAC CATHETERIZATION  11/22/2015   CARDIAC CATHETERIZATION N/A 11/22/2015   Procedure: Left Heart Cath and Cors/Grafts Angiography;   Surgeon: Marykay Lex, MD;  Location: [facility] INVASIVE CV LAB;  Service: Cardiovascular;  Laterality: N/A;   CARDIAC CATHETERIZATION N/A 11/22/2015   Procedure: Coronary Stent Intervention;  Surgeon: Marykay Lex, MD;  Location: [facility] INVASIVE CV LAB;  Service: Cardiovascular;  Laterality: N/A;   CATARACT EXTRACTION, BILATERAL Bilateral    CESAREAN SECTION  1977   CORONARY ANGIOPLASTY WITH STENT PLACEMENT  "several"   "last one was 11/2014:  DES to the posterolateral branch /notes 11/20/2015   CORONARY ARTERY BYPASS GRAFT  2003   [place]; CABG X 4   DILATION AND CURETTAGE OF UTERUS     KIDNEY STONE SURGERY     "opened me up"   LAPAROSCOPIC CHOLECYSTECTOMY     LEFT HEART CATH AND CORS/GRAFTS ANGIOGRAPHY N/A 01/03/2018   Procedure: LEFT HEART CATH AND CORS/GRAFTS ANGIOGRAPHY;  Surgeon: [name], Peter M, MD;  Location: [facility] INVASIVE CV LAB;  Service: Cardiovascular;  Laterality: N/A;   TONSILLECTOMY     TUBAL LIGATION      Current Outpatient Medications  Medication Sig Dispense Refill   aspirin 81 MG tablet Take 81 mg by mouth at bedtime.      bisoprolol (ZEBETA) 5 MG tablet Take 0.5 tablets (2.5 mg total) by mouth daily. 45 tablet 1   isosorbide mononitrate (IMDUR) 30 MG 24 hr tablet TAKE 1 TABLET BY MOUTH TWICE DAILY 180 tablet 3   levothyroxine (SYNTHROID)  75 MCG tablet TAKE 1 TABLET BY MOUTH EVERY DAY BEFORE BREAKFAST 90 tablet 1   lisinopril (ZESTRIL) 5 MG tablet Take 1 tablet (5 mg total) by mouth daily. 90 tablet 1   nitroGLYCERIN (NITROSTAT) 0.4 MG SL tablet DISSOLVE 1 TABLET UNDER THE TONGUE EVERY 5 MINUTES AS NEEDED FOR CHEST PAIN. DO NOT EXCEED A TOTAL OF 3 DOSES IN 15 MINUTES. 25 tablet 3   PARoxetine (PAXIL) 40 MG tablet Take 1 tablet (40 mg total) by mouth daily. 90 tablet 1   rosuvastatin (CRESTOR) 20 MG tablet Take 1 tablet (20 mg total) by mouth in the morning. 90 tablet 1   solifenacin (VESICARE) 10 MG tablet TAKE 1/2 TABLET BY MOUTH AT BEDTIME 30 tablet 11   Trospium Chloride 60  MG CP24 Take 1 capsule (60 mg total) by mouth daily. 30 capsule 11   No current facility-administered medications for this visit.   Allergies:  Patient has no known allergies.   ROS: No palpitations or syncope.  Physical Exam: VS:  BP 138/76    Pulse (!) 54    Ht 5\' 2"  (1.575 m)    Wt 206 lb 12.8 oz (93.8 kg)    SpO2 98%    BMI 37.82 kg/m , BMI Body mass index is 37.82 kg/m.  Wt Readings from Last 3 Encounters:  08/14/21 206 lb 12.8 oz (93.8 kg)  04/16/21 217 lb 3.2 oz (98.5 kg)  02/03/21 209 lb (94.8 kg)    General: Patient appears comfortable at rest. HEENT: Conjunctiva and lids normal, wearing a mask. Neck: Supple, no elevated JVP or carotid bruits, no thyromegaly. Lungs: Clear to auscultation, nonlabored breathing at rest. Cardiac: Regular rate and rhythm, no S3, 1/6 systolic murmur, no pericardial rub. Extremities: No pitting edema.  ECG:  An ECG dated 01/08/2021 was personally reviewed today and demonstrated:  Sinus rhythm with LVH and repolarization abnormalities.  Recent Labwork:    Component Value Date/Time   CHOL 127 05/17/2020 1113   TRIG 157 (H) 05/17/2020 1113   HDL 37 (L) 05/17/2020 1113   CHOLHDL 3.4 05/17/2020 1113   CHOLHDL 6.0 04/20/2017 1100   VLDL 69 (H) 04/20/2017 1100   LDLCALC 63 05/17/2020 1113    Other Studies Reviewed Today:  Cardiac catheterization 01/03/2018: Post Atrio lesion is 20% stenosed. Prox RCA to Mid RCA lesion is 100% stenosed. Prox Cx to Dist Cx lesion is 100% stenosed. Ost LAD to Prox LAD lesion is 100% stenosed. LIMA graft was visualized by angiography and is large. The graft exhibits no disease. SVG graft was visualized by angiography. SVG graft was visualized by angiography. SVG graft was visualized by angiography. Origin lesion is 100% stenosed. Origin to Prox Graft lesion is 100% stenosed. Origin to Prox Graft lesion is 100% stenosed. The left ventricular systolic function is normal. LV end diastolic pressure is  normal. The left ventricular ejection fraction is 55-65% by visual estimate.   1. Severe 3 vessel occlusive CAD 2. Patent LIMA to the LAD 3. Occluded SVG to diagonal 4. Occluded SVG to the OM2 5. Occluded SVG to the PDA 6. Good LV function 7. Normal LVEDP   Plan: Compared to prior cardiac cath in March 2017 the stents in the mid to distal LCx and mid RCA are completely occluded. Given extensive work done on these vessels previously she is not a candidate for CTO PCI.   Echocardiogram 05/19/2017: Study Conclusions   - Left ventricle: The cavity size was normal. Wall thickness was   increased  in a pattern of moderate LVH. Systolic function was   normal. The estimated ejection fraction was in the range of 55%   to 60%. Wall motion was normal; there were no regional wall   motion abnormalities. Doppler parameters are consistent with   abnormal left ventricular relaxation (grade 1 diastolic   dysfunction). There was no evidence of elevated ventricular   filling pressure by Doppler parameters. - Aortic valve: Mildly calcified annulus. Trileaflet; mildly   thickened leaflets. Valve area (VTI): 2.36 cm^2. Valve area   (Vmax): 1.98 cm^2. Valve area (Vmean): 2.13 cm^2. - Atrial septum: No defect or patent foramen ovale was identified. - Technically adequate study.   Assessment and Plan:  1.  Multivessel CAD status post CABG.  All vein grafts are occluded and stent sites within the mid to distal circumflex as well as mid RCA are also occluded.  LIMA to LAD is patent.  Given limited revascularization options, medical therapy continues.  She does not report progressive angina symptoms.  Continue aspirin, Zebeta, lisinopril, Imdur, and Crestor.  She has as needed nitroglycerin available as well.  2.  Mixed hyperlipidemia on Crestor.  LDL 63.  No changes were made today.  Medication Adjustments/Labs and Tests Ordered: Current medicines are reviewed at length with the patient today.  Concerns  regarding medicines are outlined above.   Tests Ordered: No orders of the defined types were placed in this encounter.   Medication Changes: No orders of the defined types were placed in this encounter.   Disposition:  Follow up  6 months.  Signed, Jonelle Sidle, MD, [facility] 08/14/2021 1:35 PM    [facility] at [facility] [address] [place], [place], [place] [address] Phone: [phone]; Fax: [phone]

## 2021-08-20 DIAGNOSIS — M3501 Sicca syndrome with keratoconjunctivitis: Secondary | ICD-10-CM | POA: Diagnosis not present

## 2021-08-22 DIAGNOSIS — I10 Essential (primary) hypertension: Secondary | ICD-10-CM | POA: Diagnosis not present

## 2021-08-22 DIAGNOSIS — E039 Hypothyroidism, unspecified: Secondary | ICD-10-CM | POA: Diagnosis not present

## 2021-08-22 DIAGNOSIS — I25709 Atherosclerosis of coronary artery bypass graft(s), unspecified, with unspecified angina pectoris: Secondary | ICD-10-CM | POA: Diagnosis not present

## 2021-08-22 DIAGNOSIS — R54 Age-related physical debility: Secondary | ICD-10-CM | POA: Diagnosis not present

## 2021-08-22 DIAGNOSIS — E782 Mixed hyperlipidemia: Secondary | ICD-10-CM | POA: Diagnosis not present

## 2021-09-02 ENCOUNTER — Other Ambulatory Visit: Payer: Self-pay | Admitting: Cardiology

## 2021-09-15 ENCOUNTER — Other Ambulatory Visit: Payer: Self-pay

## 2021-09-15 NOTE — Progress Notes (Signed)
[facility] called- confirming if patient should still be taking vesicare and trospium if patient is currently on Gemtesa samples.  Reviewed with Dr. Alyson Ingles- patient to stop vesicare and trospium and continue Gemtesa. Pharmacist aware and will make patient aware.

## 2021-09-24 DIAGNOSIS — E782 Mixed hyperlipidemia: Secondary | ICD-10-CM | POA: Diagnosis not present

## 2021-09-24 DIAGNOSIS — F32A Depression, unspecified: Secondary | ICD-10-CM | POA: Diagnosis not present

## 2021-09-24 DIAGNOSIS — E6609 Other obesity due to excess calories: Secondary | ICD-10-CM | POA: Diagnosis not present

## 2021-09-24 DIAGNOSIS — R54 Age-related physical debility: Secondary | ICD-10-CM | POA: Diagnosis not present

## 2021-09-24 DIAGNOSIS — Z9989 Dependence on other enabling machines and devices: Secondary | ICD-10-CM | POA: Diagnosis not present

## 2021-09-24 DIAGNOSIS — G4733 Obstructive sleep apnea (adult) (pediatric): Secondary | ICD-10-CM | POA: Diagnosis not present

## 2021-09-24 DIAGNOSIS — R531 Weakness: Secondary | ICD-10-CM | POA: Diagnosis not present

## 2021-09-24 DIAGNOSIS — N3281 Overactive bladder: Secondary | ICD-10-CM | POA: Diagnosis not present

## 2021-09-24 DIAGNOSIS — I10 Essential (primary) hypertension: Secondary | ICD-10-CM | POA: Diagnosis not present

## 2021-09-24 DIAGNOSIS — Z9862 Peripheral vascular angioplasty status: Secondary | ICD-10-CM | POA: Diagnosis not present

## 2021-09-24 DIAGNOSIS — Z7982 Long term (current) use of aspirin: Secondary | ICD-10-CM | POA: Diagnosis not present

## 2021-09-24 DIAGNOSIS — Z6836 Body mass index (BMI) 36.0-36.9, adult: Secondary | ICD-10-CM | POA: Diagnosis not present

## 2021-10-01 DIAGNOSIS — M3501 Sicca syndrome with keratoconjunctivitis: Secondary | ICD-10-CM | POA: Diagnosis not present

## 2021-10-21 ENCOUNTER — Encounter: Payer: Self-pay | Admitting: Urology

## 2021-10-21 ENCOUNTER — Other Ambulatory Visit: Payer: Self-pay

## 2021-10-21 ENCOUNTER — Ambulatory Visit: Payer: PPO | Admitting: Urology

## 2021-10-21 VITALS — BP 116/61 | HR 66

## 2021-10-21 DIAGNOSIS — N3281 Overactive bladder: Secondary | ICD-10-CM

## 2021-10-21 LAB — URINALYSIS, ROUTINE W REFLEX MICROSCOPIC
Bilirubin, UA: NEGATIVE
Glucose, UA: NEGATIVE
Ketones, UA: NEGATIVE
Nitrite, UA: POSITIVE — AB
Protein,UA: NEGATIVE
Specific Gravity, UA: 1.01 (ref 1.005–1.030)
Urobilinogen, Ur: 0.2 mg/dL (ref 0.2–1.0)
pH, UA: 5.5 (ref 5.0–7.5)

## 2021-10-21 NOTE — Patient Instructions (Signed)
Sacral Nerve Stimulator Implantation Sacral nerve stimulator implantation is a procedure to place a device under the skin. The device is used to treat disorders that make it hard to control urine and bowel movements. The device generates mild electrical impulses. It is placed permanently in the area of the upper buttocks. Wires (electrodes) are also inserted into the body so that electrical impulses generated by the device can be sent to the sacral nerves. The sacral nerves control several functions in the lower part of the body, including the passing of urine and stool. Before having sacral nerve stimulator implantation, you may undergo a trial test called a percutaneous nerve evaluation. This trial, which usually lasts 1-2 weeks, helps to determine if sacral nerve stimulation will help your condition. Sacral nerve stimulator implantation is a two-stage surgery. Stage 1 of this surgery involves implanting an electrode into your lower back, near your sacral nerve. A wire (lead) is attached to the electrode and run under the skin to exit through your back. The sacral nerve stimulator lead is attached to a handheld device. This trial helps determine if sacral nerve stimulation will help your condition. If your bladder symptoms improve by at least 50 percent during the trial phase, you may have the second stage. After the device is fully implanted in stage 2, it can be used 24 hours a day. The stimulator will be programmed for you. Your health care provider will set how strong the pulses will be (intensity) and how often they occur (frequency). Tell a health care provider about: Any allergies you have. All medicines you are taking, including vitamins, herbs, eye drops, creams, and over-the-counter medicines. Any problems you or family members have had with anesthetic medicines. Any blood disorders you have. Any surgeries you have had. Any medical conditions you have or have had. Whether you are pregnant or  may be pregnant. What are the risks? Generally, this is a safe procedure. However, problems may occur, including: Infection. Bleeding. Uncomfortable sensations, such as a jolting or shocking feeling. Movement of the electrode away from the place where it was inserted (migration). Failure of the stimulator. Damage to nearby structures or organs, such as nerves near the spine. Allergic reactions to medicines or the device. What happens before the procedure? Staying hydrated Follow instructions from your health care provider about hydration, which may include: Up to 2 hours before the procedure - you may continue to drink clear liquids, such as water, clear fruit juice, black coffee, and plain tea.  Eating and drinking restrictions Follow instructions from your health care provider about eating and drinking, which may include: 8 hours before the procedure - stop eating heavy meals or foods, such as meat, fried foods, or fatty foods. 6 hours before the procedure - stop eating light meals or foods, such as toast or cereal. 6 hours before the procedure - stop drinking milk or drinks that contain milk. 2 hours before the procedure - stop drinking clear liquids. Medicines Ask your health care provider about: Changing or stopping your regular medicines. This is especially important if you are taking diabetes medicines or blood thinners. Taking medicines such as aspirin and ibuprofen. These medicines can thin your blood. Do not take these medicines unless your health care provider tells you to take them. Taking over-the-counter medicines, vitamins, herbs, and supplements. General instructions Plan to have a responsible adult take you home from the hospital or clinic. If you will be going home right after the procedure, plan to have a responsible  adult care for you for the time you are told. This is important. Ask your health care provider what steps will be taken to help prevent infection. These  steps may include: Removing hair at the surgery site. Washing skin with a germ-killing soap. Taking antibiotic medicine. What happens during the procedure?  An IV will be inserted into one of your veins. You will be given one or more of the following: A medicine to help you relax (sedative). A medicine to numb the area (local anesthetic). A medicine to make you fall asleep (general anesthetic). Long needles will be inserted into your lower back. The needles will be guided to the place where the nerves exit the backbone. The position of the needles will be tested. If they are in the right spot, your toes or feet may move. If you are awake, you may feel a tingling in your legs. The electrodes will be inserted through the needles and into your body. The electrodes will be anchored in place close to your sacral nerves. Small incisions will be made under the skin in your upper buttocks. The sacral nerve stimulator device will be placed in this area. The ends of the electrodes that attach to the stimulator will be guided under your skin. They will extend from your sacral nerves, where they are anchored, to the stimulator device. They will then be attached to the device. Your health care provider will program the rate at which the nerve stimulator will deliver the electronic pulses. The incisions will be closed with stitches (sutures) or staples. A bandage (dressing) will be placed over the incision area. The procedure may vary among health care providers and hospitals. What happens after the procedure? Your blood pressure, heart rate, breathing rate, and blood oxygen level will be monitored until you leave the hospital or clinic. You may be given pain and antibiotic medicine as needed. You will be taught how to use and care for the device. If you were given a sedative during the procedure, it can affect you for several hours. Do not drive or operate machinery until your health care provider says  that it is safe. Summary Sacral nerve stimulator implantation is a procedure to place a device under your skin. The device will treat disorders that make it hard to control urine and bowel movements. The sacral nerves control several functions in the lower part of the body, including bladder and bowel functions. If you will be going home right after the procedure, plan to have a responsible adult care for you for the time you are told. You will be taught how to use and care for the device. If you were given a sedative during the procedure, do not drive until your health care provider approves. This information is not intended to replace advice given to you by your health care provider. Make sure you discuss any questions you have with your health care provider. Document Revised: 03/15/2020 Document Reviewed: 03/15/2020 Elsevier Patient Education  [place].

## 2021-10-21 NOTE — Progress Notes (Signed)
10/21/2021 10:36 AM   Molli Barrows 14-Aug-1946 [id number]  Referring provider: No referring provider defined for this encounter.  Followup OAB   HPI: Lacey Bailey is a 76yo here for followup for OAB. She has failed mirabegron, vesicare, and gemtesa. She uses 5-7 pads per day which are soaked. She is unhappy with her incontinence. She has urinary frequency every hour. She has daily urgency and urge incontinence.    PMH: Past Medical History:  Diagnosis Date   Aortic sclerosis    CAD (coronary artery disease)    a. s/p CABG in 2003 b. multiple interventions since at outside hospitals c. DES to PLB in 11/2014 d. angioplasty to mid-RCA and mid-LCx in 10/2015 with patent LIMA-LAD and occlusion of all vein grafts e. 12/2017: occlusion of vein grafts and native LCx and RCA with patent LIMA-LAD. Re-do CABG NOT recommended by CT surgery   Depression    Essential hypertension    Hyperlipidemia    Hypothyroidism    Kidney stones    TIA (transient ischemic attack) ~ 2013    Surgical History: Past Surgical History:  Procedure Laterality Date   ABDOMINAL HYSTERECTOMY     APPENDECTOMY     BLEPHAROPLASTY Bilateral    "uppers"   CARDIAC CATHETERIZATION  11/22/2015   CARDIAC CATHETERIZATION N/A 11/22/2015   Procedure: Left Heart Cath and Cors/Grafts Angiography;  Surgeon: Marykay Lex, MD;  Location[facility] INVASIVE CV LAB;  Service: Cardiovascular;  Laterality: N/A;   CARDIAC CATHETERIZATION N/A 11/22/2015   Procedure: Coronary Stent Intervention;  Surgeon: Marykay Lex, MD;  Location: [facility] INVASIVE CV LAB;  Service: Cardiovascular;  Laterality: N/A;   CATARACT EXTRACTION, BILATERAL Bilateral    CESAREAN SECTION  1977   CORONARY ANGIOPLASTY WITH STENT PLACEMENT  "several"   "last one was 11/2014:  DES to the posterolateral branch /notes 11/20/2015   CORONARY ARTERY BYPASS GRAFT  2003   [place]; CABG X 4   DILATION AND CURETTAGE OF UTERUS     KIDNEY STONE SURGERY     "opened me up"    LAPAROSCOPIC CHOLECYSTECTOMY     LEFT HEART CATH AND CORS/GRAFTS ANGIOGRAPHY N/A 01/03/2018   Procedure: LEFT HEART CATH AND CORS/GRAFTS ANGIOGRAPHY;  Surgeon: [name], Peter M, MD;  Location: [facility] INVASIVE CV LAB;  Service: Cardiovascular;  Laterality: N/A;   TONSILLECTOMY     TUBAL LIGATION      Home Medications:  Allergies as of 10/21/2021       Reactions   No Known Allergies         Medication List        Accurate as of October 21, 2021 10:36 AM. If you have any questions, ask your nurse or doctor.          aspirin 81 MG tablet Take 81 mg by mouth at bedtime.   bisoprolol 5 MG tablet Commonly known as: ZEBETA Take 0.5 tablets (2.5 mg total) by mouth daily.   isosorbide mononitrate 30 MG 24 hr tablet Commonly known as: IMDUR TAKE 1 TABLET BY MOUTH TWICE DAILY   levothyroxine 75 MCG tablet Commonly known as: SYNTHROID TAKE 1 TABLET BY MOUTH EVERY DAY BEFORE BREAKFAST   lisinopril 5 MG tablet Commonly known as: ZESTRIL Take 1 tablet (5 mg total) by mouth daily.   nitroGLYCERIN 0.4 MG SL tablet Commonly known as: NITROSTAT DISSOLVE 1 TABLET UNDER THE TONGUE EVERY 5 MINUTES AS NEEDED FOR CHEST PAIN. DO NOT EXCEED A TOTAL OF 3 DOSES IN 15 MINUTES. EMERGENCY REFILL  FAXED DR.   PARoxetine 40 MG tablet Commonly known as: PAXIL Take 1 tablet (40 mg total) by mouth daily.   rosuvastatin 20 MG tablet Commonly known as: CRESTOR Take 1 tablet (20 mg total) by mouth in the morning.        Allergies:  Allergies  Allergen Reactions   No Known Allergies     Family History: Family History  Problem Relation Age of Onset   Heart attack Mother    Heart disease Mother    Heart disease Brother     Social History:  reports that she has never smoked. She has never used smokeless tobacco. She reports that she does not currently use alcohol. She reports that she does not use drugs.  ROS: All other review of systems were reviewed and are negative except what is noted  above in HPI  Physical Exam: BP 116/61    Pulse 66   Constitutional:  Alert and oriented, No acute distress. HEENT: [place] AT, moist mucus membranes.  Trachea midline, no masses. Cardiovascular: No clubbing, cyanosis, or edema. Respiratory: Normal respiratory effort, no increased work of breathing. GI: Abdomen is soft, nontender, nondistended, no abdominal masses GU: No CVA tenderness.  Lymph: No cervical or inguinal lymphadenopathy. Skin: No rashes, bruises or suspicious lesions. Neurologic: Grossly intact, no focal deficits, moving all 4 extremities. Psychiatric: Normal mood and affect.  Laboratory Data: Lab Results  Component Value Date   WBC 6.7 05/17/2020   HGB 13.9 05/17/2020   HCT 41.3 05/17/2020   MCV 91 05/17/2020   PLT 321 05/17/2020    Lab Results  Component Value Date   CREATININE 1.36 (H) 05/17/2020    No results found for: PSA  No results found for: TESTOSTERONE  No results found for: HGBA1C  Urinalysis No results found for: COLORURINE, APPEARANCEUR, LABSPEC, PHURINE, GLUCOSEU, HGBUR, BILIRUBINUR, KETONESUR, PROTEINUR, UROBILINOGEN, NITRITE, LEUKOCYTESUR  No results found for: LABMICR, WBCUA, RBCUA, LABEPIT, MUCUS, BACTERIA  Pertinent Imaging:  No results found for this or any previous visit.  No results found for this or any previous visit.  No results found for this or any previous visit.  No results found for this or any previous visit.  No results found for this or any previous visit.  No results found for this or any previous visit.  No results found for this or any previous visit.  No results found for this or any previous visit.   Assessment & Plan:    1. OAB (overactive bladder) -We discussed intravesical botox, PTNS, and interstim. After discussing the options the patient elects for PTNS - Urinalysis, Routine w reflex microscopic   No follow-ups on file.  Wilkie Aye, MD  [facility]

## 2021-10-21 NOTE — Addendum Note (Signed)
Addended byGustavus Messing on: 10/21/2021 02:10 PM   Modules accepted: Orders

## 2021-10-25 LAB — URINE CULTURE

## 2021-10-27 ENCOUNTER — Other Ambulatory Visit: Payer: Self-pay | Admitting: Physician Assistant

## 2021-10-27 ENCOUNTER — Telehealth: Payer: Self-pay

## 2021-10-27 MED ORDER — NITROFURANTOIN MONOHYD MACRO 100 MG PO CAPS: 100.0000 mg | ORAL_CAPSULE | Freq: Two times a day (BID) | ORAL | 0 refills | Status: AC

## 2021-10-27 NOTE — Telephone Encounter (Signed)
-----   Message from Reynaldo Minium, [place] sent at 10/27/2021  3:32 PM EST ----- ?Please let pt know her cx is positive for E Coli and I have sent Rx for Macrobid to her pharmacy. She needs to increase fluid intake while on this medication. ?----- Message ----- ?From: Dorisann Frames, RN ?Sent: 10/27/2021   9:13 AM EST ?To: Cleon Gustin, MD, # ? ?No treatment started- please review in Dr. Alyson Ingles absence   ? ?

## 2021-10-27 NOTE — Telephone Encounter (Signed)
Patient called with no answer. Detailed message left. °

## 2021-10-27 NOTE — Progress Notes (Signed)
Macrobid twice daily for 7 days sent to patient's pharmacy for E. coli culture positive UTI. ?

## 2021-10-28 NOTE — Telephone Encounter (Signed)
Patient returned call given results again per [facility].  Patient voiced understanding. ?

## 2021-11-03 ENCOUNTER — Telehealth: Payer: Self-pay

## 2021-11-03 NOTE — Telephone Encounter (Signed)
Scheduled PTNS # 1 with patient. Others to be scheduled at time of appt next week.  ?

## 2021-11-03 NOTE — Telephone Encounter (Signed)
Patient needing a call back regarding treatments.  Not sure what she is needing. ? ?Please advise. ? ?Call back:  [phone] ? ?Thanks, ?Rosey Bath ?

## 2021-11-05 ENCOUNTER — Other Ambulatory Visit: Payer: Self-pay | Admitting: Family Medicine

## 2021-11-05 DIAGNOSIS — I10 Essential (primary) hypertension: Secondary | ICD-10-CM

## 2021-11-11 ENCOUNTER — Ambulatory Visit (INDEPENDENT_AMBULATORY_CARE_PROVIDER_SITE_OTHER): Payer: PPO

## 2021-11-11 ENCOUNTER — Other Ambulatory Visit: Payer: Self-pay

## 2021-11-11 DIAGNOSIS — N3281 Overactive bladder: Secondary | ICD-10-CM

## 2021-11-11 NOTE — Progress Notes (Signed)
PTNS ? ?Session # 1 of 12 ? ?Health & Social Factors: none ?Caffeine: 15 oz. ?Alcohol: none ?Daytime voids #per day: 3-4 ?Night-time voids #per night: 3-4 ?Urgency: yes ?Incontinence Episodes #per day: n/a ?Ankle used: right ?Treatment Setting: 8 ?Feeling/ Response: positive sensation  ?Comments: n/a ? ?Performed By: Bridgette Habermann ? ?Follow Up: keep scheduled NV ?

## 2021-11-18 ENCOUNTER — Other Ambulatory Visit: Payer: Self-pay

## 2021-11-18 ENCOUNTER — Ambulatory Visit (INDEPENDENT_AMBULATORY_CARE_PROVIDER_SITE_OTHER): Payer: PPO

## 2021-11-18 DIAGNOSIS — N3281 Overactive bladder: Secondary | ICD-10-CM

## 2021-11-18 NOTE — Progress Notes (Signed)
PTNS ? ?Session # 2 of 12 ? ?Health & Social Factors: none ?Caffeine: 1 cup of coffee ?Alcohol: none ?Daytime voids #per day: 2 ?Night-time voids #per night: 4-5 ?Urgency: yes ?Incontinence Episodes #per day: n/a ?Ankle used: left ?Treatment Setting: 7 ?Feeling/ Response: Feeling something in my heel ?Comments: n/a ? ?Performed By: Guss Bunde, CMA ? ?Follow Up: Keep next scheduled apt.  ?

## 2021-11-19 DIAGNOSIS — E782 Mixed hyperlipidemia: Secondary | ICD-10-CM | POA: Diagnosis not present

## 2021-11-19 DIAGNOSIS — Z8673 Personal history of transient ischemic attack (TIA), and cerebral infarction without residual deficits: Secondary | ICD-10-CM | POA: Diagnosis not present

## 2021-11-19 DIAGNOSIS — R54 Age-related physical debility: Secondary | ICD-10-CM | POA: Diagnosis not present

## 2021-11-19 DIAGNOSIS — I10 Essential (primary) hypertension: Secondary | ICD-10-CM | POA: Diagnosis not present

## 2021-11-19 DIAGNOSIS — I25700 Atherosclerosis of coronary artery bypass graft(s), unspecified, with unstable angina pectoris: Secondary | ICD-10-CM | POA: Diagnosis not present

## 2021-11-19 DIAGNOSIS — E039 Hypothyroidism, unspecified: Secondary | ICD-10-CM | POA: Diagnosis not present

## 2021-11-25 ENCOUNTER — Encounter: Payer: Self-pay | Admitting: Urology

## 2021-11-25 ENCOUNTER — Ambulatory Visit (INDEPENDENT_AMBULATORY_CARE_PROVIDER_SITE_OTHER): Payer: PPO | Admitting: Urology

## 2021-11-25 ENCOUNTER — Ambulatory Visit: Payer: PPO

## 2021-11-25 DIAGNOSIS — N3281 Overactive bladder: Secondary | ICD-10-CM | POA: Diagnosis not present

## 2021-11-25 NOTE — Progress Notes (Signed)
PTNS ? ?Session # 3 of 12 ? ?Health & Social Factors: no ?Caffeine: 1 cup of coffee ?Alcohol: none ?Daytime voids #per day: 2 ?Night-time voids #per night: 4 ?Urgency: yes ?Incontinence Episodes #per day: often ?Ankle used: right ?Treatment Setting: 5 ?Feeling/ Response: feeling in toes and heel ?Comments: n/a ? ?Performed By: Guss Bunde, CMA ? ?Follow Up: Keep next scheduled apt  ?

## 2021-12-02 ENCOUNTER — Ambulatory Visit (INDEPENDENT_AMBULATORY_CARE_PROVIDER_SITE_OTHER): Payer: PPO | Admitting: Physician Assistant

## 2021-12-02 DIAGNOSIS — N3281 Overactive bladder: Secondary | ICD-10-CM

## 2021-12-02 NOTE — Progress Notes (Signed)
PTNS ? ?Session # 4 of 12 ? ?Health & Social Factors: no ?Caffeine: 1 cup of coffee ?Alcohol: none ?Daytime voids #per day: 2 ?Night-time voids #per night: 2-3 ?Urgency: yes ?Incontinence Episodes #per day: n/a ?Ankle used: Left ?Treatment Setting: 2 ?Feeling/ Response: feel in my foot and toes ?Comments: tolerable ? ?Performed By: Guss Bunde, CMA ? ?Follow Up: Follow up as scheduled.   ?

## 2021-12-09 ENCOUNTER — Ambulatory Visit (INDEPENDENT_AMBULATORY_CARE_PROVIDER_SITE_OTHER): Payer: PPO | Admitting: Urology

## 2021-12-09 DIAGNOSIS — N3281 Overactive bladder: Secondary | ICD-10-CM | POA: Diagnosis not present

## 2021-12-09 NOTE — Progress Notes (Signed)
PTNS  Session # 5 of 12  Health & Social Factors: none Caffeine: 1 cup daily Alcohol: none Daytime voids #per day: 2-3 Night-time voids #per night: 2-3 Urgency: yes Incontinence Episodes #per day: 2 Ankle used: right Treatment Setting: 2 Feeling/ Response: positive sensation Comments: none  Performed By: Othon Guardia Lpn  Follow Up: keep scheduled NV

## 2021-12-10 ENCOUNTER — Telehealth: Payer: Self-pay

## 2021-12-10 NOTE — Telephone Encounter (Signed)
Received application for renewal of disability parking placard. ? ?PCP to complete this form- called and spoke with patient and voiced understanding she would reach out to Renaldo Harrison to have this completed. ? ?Patient requested we mail back the application to patient as she does not live close. ? ?Application form sent back to patient via mail.  ?

## 2021-12-16 ENCOUNTER — Ambulatory Visit (INDEPENDENT_AMBULATORY_CARE_PROVIDER_SITE_OTHER): Payer: PPO | Admitting: Urology

## 2021-12-16 DIAGNOSIS — N3281 Overactive bladder: Secondary | ICD-10-CM

## 2021-12-16 NOTE — Progress Notes (Signed)
PTNS ? ?Session # 6 of 12 ? ?Health & Social Factors: No ?Caffeine: 1 cup ?Alcohol: none ?Daytime voids #per day: 2 ?Night-time voids #per night: 2 ?Urgency: yes ?Incontinence Episodes #per day: n/a ?Ankle used: left ?Treatment Setting: 2 ?Feeling/ Response: feel in my foot ?Comments: tolerable ? ?Performed By: Levi Aland, CMA ? ?Follow Up: Follow up as scheduled.   ?

## 2021-12-19 ENCOUNTER — Other Ambulatory Visit: Payer: Self-pay | Admitting: Cardiology

## 2021-12-23 ENCOUNTER — Encounter: Payer: Self-pay | Admitting: Urology

## 2021-12-23 ENCOUNTER — Ambulatory Visit (INDEPENDENT_AMBULATORY_CARE_PROVIDER_SITE_OTHER): Payer: PPO | Admitting: Urology

## 2021-12-23 DIAGNOSIS — N3281 Overactive bladder: Secondary | ICD-10-CM | POA: Diagnosis not present

## 2021-12-23 NOTE — Progress Notes (Signed)
PTNS ? ?Session # 7 of 12 ? ?Health & Social Factors: No ?Caffeine: 1 cup of coffee ?Alcohol: none ?Daytime voids #per day: 2 ?Night-time voids #per night: 1-2 ?Urgency: yes ?Incontinence Episodes #per day: n/a ?Ankle used: right ?Treatment Setting: 4 ?Feeling/ Response: feel it strong in my foot ?Comments: n/a ? ?Performed By: Levi Aland, CMA ? ?Follow Up: Follow up as scheduled.   ?

## 2021-12-30 ENCOUNTER — Ambulatory Visit (INDEPENDENT_AMBULATORY_CARE_PROVIDER_SITE_OTHER): Payer: PPO | Admitting: Physician Assistant

## 2021-12-30 DIAGNOSIS — N3281 Overactive bladder: Secondary | ICD-10-CM | POA: Diagnosis not present

## 2021-12-30 NOTE — Progress Notes (Signed)
PTNS ? ?Session # 8 of 12 ? ?Health & Social Factors: no ?Caffeine: 1 cup of coffee ?Alcohol: none ?Daytime voids #per day: 3 ?Night-time voids #per night: 1-2 ?Urgency: yes ?Incontinence Episodes #per day: n/a ?Ankle used: left ?Treatment Setting: 5 ?Feeling/ Response: feel it in my foot tingling ?Comments: n/a ? ?Performed By: Guss Bunde, CMA ? ?Follow Up: Follow up as scheduled.   ?

## 2022-01-06 ENCOUNTER — Ambulatory Visit (INDEPENDENT_AMBULATORY_CARE_PROVIDER_SITE_OTHER): Payer: PPO | Admitting: Physician Assistant

## 2022-01-06 DIAGNOSIS — N3281 Overactive bladder: Secondary | ICD-10-CM | POA: Diagnosis not present

## 2022-01-06 NOTE — Progress Notes (Signed)
PTNS ? ?Session # 9 of 12 ? ?Health & Social Factors: none ?Caffeine: 1 cup ?Alcohol: none ?Daytime voids #per day: 2-3 ?Night-time voids #per night: 1-2 ?Urgency: yes ?Incontinence Episodes #per day: n/a ?Ankle used: left ?Treatment Setting: 4 ?Feeling/ Response: feel it in my foot ?Comments: n/a ? ?Performed By: Guss Bunde, CMA ? ?Follow Up: Follow up as scheduled.   ?

## 2022-01-13 ENCOUNTER — Ambulatory Visit (INDEPENDENT_AMBULATORY_CARE_PROVIDER_SITE_OTHER): Payer: PPO | Admitting: Physician Assistant

## 2022-01-13 DIAGNOSIS — N3281 Overactive bladder: Secondary | ICD-10-CM | POA: Diagnosis not present

## 2022-01-13 NOTE — Progress Notes (Signed)
PTNS  Session # 10 of 12  Health & Social Factors: none Caffeine: 1 cup of coffee Alcohol: none Daytime voids #per day: 1-2 Night-time voids #per night: 1-2 Urgency: yes Incontinence Episodes #per day: n/a Ankle used: left Treatment Setting: 2 Feeling/ Response: feeling in foot and leg Comments: n/a  Performed By: Guss Bunde, CMA  Follow Up: Follow up as scheduled.

## 2022-01-20 ENCOUNTER — Encounter: Payer: Self-pay | Admitting: Urology

## 2022-01-20 ENCOUNTER — Ambulatory Visit (INDEPENDENT_AMBULATORY_CARE_PROVIDER_SITE_OTHER): Payer: PPO | Admitting: Urology

## 2022-01-20 DIAGNOSIS — N3281 Overactive bladder: Secondary | ICD-10-CM | POA: Diagnosis not present

## 2022-01-20 NOTE — Progress Notes (Signed)
PTNS  Session # 11 of 12  Health & Social Factors: None Caffeine: 1 cup of coffee/ 2 cups of tea Alcohol: none Daytime voids #per day: 1-2 Night-time voids #per night: 1 Urgency: yes Incontinence Episodes #per day: n/a Ankle used: left Treatment Setting: 3 Feeling/ Response: Feeling in anke and foot Comments: n/a  Performed By: Guss Bunde, CMA  Follow Up: Follow up as scheduled.

## 2022-01-27 ENCOUNTER — Ambulatory Visit (INDEPENDENT_AMBULATORY_CARE_PROVIDER_SITE_OTHER): Payer: PPO | Admitting: Physician Assistant

## 2022-01-27 DIAGNOSIS — N3281 Overactive bladder: Secondary | ICD-10-CM | POA: Diagnosis not present

## 2022-01-27 NOTE — Progress Notes (Unsigned)
PTNS  Session # 12 of 12  Health & Social Factors: none Caffeine: 1 cup of coffee, 1-2 glasses of tea Alcohol: none Daytime voids #per day: 1-2 Night-time voids #per night: 1-2 Urgency: yes Incontinence Episodes #per day: n/a Ankle used: right Treatment Setting: 3 Feeling/ Response: Feeling in foot Comments: n/a  Performed By: Guss Bunde, CMA  Follow Up: F/u with MD

## 2022-01-30 ENCOUNTER — Ambulatory Visit: Payer: PPO | Admitting: Urology

## 2022-02-02 DIAGNOSIS — I10 Essential (primary) hypertension: Secondary | ICD-10-CM | POA: Diagnosis not present

## 2022-02-02 DIAGNOSIS — Z9989 Dependence on other enabling machines and devices: Secondary | ICD-10-CM | POA: Diagnosis not present

## 2022-02-02 DIAGNOSIS — E6609 Other obesity due to excess calories: Secondary | ICD-10-CM | POA: Diagnosis not present

## 2022-02-02 DIAGNOSIS — F32A Depression, unspecified: Secondary | ICD-10-CM | POA: Diagnosis not present

## 2022-02-02 DIAGNOSIS — Z6836 Body mass index (BMI) 36.0-36.9, adult: Secondary | ICD-10-CM | POA: Diagnosis not present

## 2022-02-02 DIAGNOSIS — E782 Mixed hyperlipidemia: Secondary | ICD-10-CM | POA: Diagnosis not present

## 2022-02-02 DIAGNOSIS — G4733 Obstructive sleep apnea (adult) (pediatric): Secondary | ICD-10-CM | POA: Diagnosis not present

## 2022-02-02 DIAGNOSIS — N3281 Overactive bladder: Secondary | ICD-10-CM | POA: Diagnosis not present

## 2022-02-02 DIAGNOSIS — E039 Hypothyroidism, unspecified: Secondary | ICD-10-CM | POA: Diagnosis not present

## 2022-02-02 DIAGNOSIS — I25709 Atherosclerosis of coronary artery bypass graft(s), unspecified, with unspecified angina pectoris: Secondary | ICD-10-CM | POA: Diagnosis not present

## 2022-02-02 DIAGNOSIS — Z9862 Peripheral vascular angioplasty status: Secondary | ICD-10-CM | POA: Diagnosis not present

## 2022-02-19 ENCOUNTER — Ambulatory Visit: Payer: PPO | Admitting: Cardiology

## 2022-02-20 ENCOUNTER — Ambulatory Visit: Payer: PPO | Admitting: Urology

## 2022-02-27 ENCOUNTER — Ambulatory Visit: Payer: PPO | Admitting: Urology

## 2022-02-27 VITALS — BP 123/72 | HR 63

## 2022-02-27 DIAGNOSIS — N3281 Overactive bladder: Secondary | ICD-10-CM

## 2022-02-27 NOTE — H&P (View-Only) (Signed)
02/27/2022 12:24 PM   ROSEMARY MOSSBARGER 11/30/1945 [id number]  Referring provider: Renaldo Harrison, NP [address] [place],  [place] [address]  Followup OAB   HPI: [name] is a 76yo here for followup for OAB. She did PTNS and she has not noticed an improvement in urgency and urge incontinence. She uses 4-5 pads per day. She is currently on treatment 9/12 of PTNS. No straining to urinate.   PMH: Past Medical History:  Diagnosis Date   Aortic sclerosis    CAD (coronary artery disease)    a. s/p CABG in 2003 b. multiple interventions since at outside hospitals c. DES to PLB in 11/2014 d. angioplasty to mid-RCA and mid-LCx in 10/2015 with patent LIMA-LAD and occlusion of all vein grafts e. 12/2017: occlusion of vein grafts and native LCx and RCA with patent LIMA-LAD. Re-do CABG NOT recommended by CT surgery   Depression    Essential hypertension    Hyperlipidemia    Hypothyroidism    Kidney stones    TIA (transient ischemic attack) ~ 2013    Surgical History: Past Surgical History:  Procedure Laterality Date   ABDOMINAL HYSTERECTOMY     APPENDECTOMY     BLEPHAROPLASTY Bilateral    "uppers"   CARDIAC CATHETERIZATION  11/22/2015   CARDIAC CATHETERIZATION N/A 11/22/2015   Procedure: Left Heart Cath and Cors/Grafts Angiography;  Surgeon: Marykay Lex, MD;  Location: [facility] INVASIVE CV LAB;  Service: Cardiovascular;  Laterality: N/A;   CARDIAC CATHETERIZATION N/A 11/22/2015   Procedure: Coronary Stent Intervention;  Surgeon: Marykay Lex, MD;  Location: [facility] INVASIVE CV LAB;  Service: Cardiovascular;  Laterality: N/A;   CATARACT EXTRACTION, BILATERAL Bilateral    CESAREAN SECTION  1977   CORONARY ANGIOPLASTY WITH STENT PLACEMENT  "several"   "last one was 11/2014:  DES to the posterolateral branch /notes 11/20/2015   CORONARY ARTERY BYPASS GRAFT  2003   [place]; CABG X 4   DILATION AND CURETTAGE OF UTERUS     KIDNEY STONE SURGERY     "opened me up"   LAPAROSCOPIC CHOLECYSTECTOMY      LEFT HEART CATH AND CORS/GRAFTS ANGIOGRAPHY N/A 01/03/2018   Procedure: LEFT HEART CATH AND CORS/GRAFTS ANGIOGRAPHY;  Surgeon: [name], Peter M, MD;  Location: [facility] INVASIVE CV LAB;  Service: Cardiovascular;  Laterality: N/A;   TONSILLECTOMY     TUBAL LIGATION      Home Medications:  Allergies as of 02/27/2022       Reactions   No Known Allergies         Medication List        Accurate as of February 27, 2022 12:24 PM. If you have any questions, ask your nurse or doctor.          aspirin 81 MG tablet Take 81 mg by mouth at bedtime.   bisoprolol 5 MG tablet Commonly known as: ZEBETA Take 0.5 tablets (2.5 mg total) by mouth daily.   isosorbide mononitrate 30 MG 24 hr tablet Commonly known as: IMDUR TAKE 1 TABLET BY MOUTH TWICE DAILY   levothyroxine 75 MCG tablet Commonly known as: SYNTHROID TAKE 1 TABLET BY MOUTH EVERY DAY BEFORE BREAKFAST   lisinopril 5 MG tablet Commonly known as: ZESTRIL Take 1 tablet (5 mg total) by mouth daily.   nitroGLYCERIN 0.4 MG SL tablet Commonly known as: NITROSTAT DISSOLVE 1 TABLET UNDER THE TONGUE EVERY 5 MINUTES AS NEEDED FOR CHEST PAIN. DO NOT EXCEED A TOTAL OF 3 DOSES IN 15 MINUTES.  PARoxetine 40 MG tablet Commonly known as: PAXIL Take 1 tablet (40 mg total) by mouth daily.   rosuvastatin 20 MG tablet Commonly known as: CRESTOR Take 1 tablet (20 mg total) by mouth in the morning.        Allergies:  Allergies  Allergen Reactions   No Known Allergies     Family History: Family History  Problem Relation Age of Onset   Heart attack Mother    Heart disease Mother    Heart disease Brother     Social History:  reports that she has never smoked. She has never used smokeless tobacco. She reports that she does not currently use alcohol. She reports that she does not use drugs.  ROS: All other review of systems were reviewed and are negative except what is noted above in HPI  Physical Exam: BP 123/72   Pulse 63    Constitutional:  Alert and oriented, No acute distress. HEENT: [place] AT, moist mucus membranes.  Trachea midline, no masses. Cardiovascular: No clubbing, cyanosis, or edema. Respiratory: Normal respiratory effort, no increased work of breathing. GI: Abdomen is soft, nontender, nondistended, no abdominal masses GU: No CVA tenderness.  Lymph: No cervical or inguinal lymphadenopathy. Skin: No rashes, bruises or suspicious lesions. Neurologic: Grossly intact, no focal deficits, moving all 4 extremities. Psychiatric: Normal mood and affect.  Laboratory Data: Lab Results  Component Value Date   WBC 6.7 05/17/2020   HGB 13.9 05/17/2020   HCT 41.3 05/17/2020   MCV 91 05/17/2020   PLT 321 05/17/2020    Lab Results  Component Value Date   CREATININE 1.36 (H) 05/17/2020    No results found for: "PSA"  No results found for: "TESTOSTERONE"  No results found for: "HGBA1C"  Urinalysis    Component Value Date/Time   APPEARANCEUR Cloudy (A) 10/21/2021 1059   GLUCOSEU Negative 10/21/2021 1059   BILIRUBINUR Negative 10/21/2021 1059   PROTEINUR Negative 10/21/2021 1059   NITRITE Positive (A) 10/21/2021 1059   LEUKOCYTESUR 3+ (A) 10/21/2021 1059    Lab Results  Component Value Date   LABMICR Comment 10/21/2021    Pertinent Imaging:  No results found for this or any previous visit.  No results found for this or any previous visit.  No results found for this or any previous visit.  No results found for this or any previous visit.  No results found for this or any previous visit.  No results found for this or any previous visit.  No results found for this or any previous visit.  No results found for this or any previous visit.   Assessment & Plan:    1. OAB (overactive bladder) -We discussed intravesical botox. Handout given and patient will call with her decision.  - Urinalysis, Routine w reflex microscopic   No follow-ups on file.  Fany Cavanaugh, MD  [facility]   

## 2022-02-27 NOTE — Progress Notes (Signed)
02/27/2022 12:24 PM   Lacey Bailey 11/30/1945 [id number]  Referring provider: Renaldo Harrison, NP [address] [place],  [place] [address]  Followup OAB   HPI: Lacey Bailey is a 76yo here for followup for OAB. She did PTNS and she has not noticed an improvement in urgency and urge incontinence. She uses 4-5 pads per day. She is currently on treatment 9/12 of PTNS. No straining to urinate.   PMH: Past Medical History:  Diagnosis Date   Aortic sclerosis    CAD (coronary artery disease)    a. s/p CABG in 2003 b. multiple interventions since at outside hospitals c. DES to PLB in 11/2014 d. angioplasty to mid-RCA and mid-LCx in 10/2015 with patent LIMA-LAD and occlusion of all vein grafts e. 12/2017: occlusion of vein grafts and native LCx and RCA with patent LIMA-LAD. Re-do CABG NOT recommended by CT surgery   Depression    Essential hypertension    Hyperlipidemia    Hypothyroidism    Kidney stones    TIA (transient ischemic attack) ~ 2013    Surgical History: Past Surgical History:  Procedure Laterality Date   ABDOMINAL HYSTERECTOMY     APPENDECTOMY     BLEPHAROPLASTY Bilateral    "uppers"   CARDIAC CATHETERIZATION  11/22/2015   CARDIAC CATHETERIZATION N/A 11/22/2015   Procedure: Left Heart Cath and Cors/Grafts Angiography;  Surgeon: Marykay Lex, MD;  Location: [facility] INVASIVE CV LAB;  Service: Cardiovascular;  Laterality: N/A;   CARDIAC CATHETERIZATION N/A 11/22/2015   Procedure: Coronary Stent Intervention;  Surgeon: Marykay Lex, MD;  Location: [facility] INVASIVE CV LAB;  Service: Cardiovascular;  Laterality: N/A;   CATARACT EXTRACTION, BILATERAL Bilateral    CESAREAN SECTION  1977   CORONARY ANGIOPLASTY WITH STENT PLACEMENT  "several"   "last one was 11/2014:  DES to the posterolateral branch /notes 11/20/2015   CORONARY ARTERY BYPASS GRAFT  2003   [place]; CABG X 4   DILATION AND CURETTAGE OF UTERUS     KIDNEY STONE SURGERY     "opened me up"   LAPAROSCOPIC CHOLECYSTECTOMY      LEFT HEART CATH AND CORS/GRAFTS ANGIOGRAPHY N/A 01/03/2018   Procedure: LEFT HEART CATH AND CORS/GRAFTS ANGIOGRAPHY;  Surgeon: [name], Peter M, MD;  Location: [facility] INVASIVE CV LAB;  Service: Cardiovascular;  Laterality: N/A;   TONSILLECTOMY     TUBAL LIGATION      Home Medications:  Allergies as of 02/27/2022       Reactions   No Known Allergies         Medication List        Accurate as of February 27, 2022 12:24 PM. If you have any questions, ask your nurse or doctor.          aspirin 81 MG tablet Take 81 mg by mouth at bedtime.   bisoprolol 5 MG tablet Commonly known as: ZEBETA Take 0.5 tablets (2.5 mg total) by mouth daily.   isosorbide mononitrate 30 MG 24 hr tablet Commonly known as: IMDUR TAKE 1 TABLET BY MOUTH TWICE DAILY   levothyroxine 75 MCG tablet Commonly known as: SYNTHROID TAKE 1 TABLET BY MOUTH EVERY DAY BEFORE BREAKFAST   lisinopril 5 MG tablet Commonly known as: ZESTRIL Take 1 tablet (5 mg total) by mouth daily.   nitroGLYCERIN 0.4 MG SL tablet Commonly known as: NITROSTAT DISSOLVE 1 TABLET UNDER THE TONGUE EVERY 5 MINUTES AS NEEDED FOR CHEST PAIN. DO NOT EXCEED A TOTAL OF 3 DOSES IN 15 MINUTES.  PARoxetine 40 MG tablet Commonly known as: PAXIL Take 1 tablet (40 mg total) by mouth daily.   rosuvastatin 20 MG tablet Commonly known as: CRESTOR Take 1 tablet (20 mg total) by mouth in the morning.        Allergies:  Allergies  Allergen Reactions   No Known Allergies     Family History: Family History  Problem Relation Age of Onset   Heart attack Mother    Heart disease Mother    Heart disease Brother     Social History:  reports that she has never smoked. She has never used smokeless tobacco. She reports that she does not currently use alcohol. She reports that she does not use drugs.  ROS: All other review of systems were reviewed and are negative except what is noted above in HPI  Physical Exam: BP 123/72   Pulse 63    Constitutional:  Alert and oriented, No acute distress. HEENT: [place] AT, moist mucus membranes.  Trachea midline, no masses. Cardiovascular: No clubbing, cyanosis, or edema. Respiratory: Normal respiratory effort, no increased work of breathing. GI: Abdomen is soft, nontender, nondistended, no abdominal masses GU: No CVA tenderness.  Lymph: No cervical or inguinal lymphadenopathy. Skin: No rashes, bruises or suspicious lesions. Neurologic: Grossly intact, no focal deficits, moving all 4 extremities. Psychiatric: Normal mood and affect.  Laboratory Data: Lab Results  Component Value Date   WBC 6.7 05/17/2020   HGB 13.9 05/17/2020   HCT 41.3 05/17/2020   MCV 91 05/17/2020   PLT 321 05/17/2020    Lab Results  Component Value Date   CREATININE 1.36 (H) 05/17/2020    No results found for: "PSA"  No results found for: "TESTOSTERONE"  No results found for: "HGBA1C"  Urinalysis    Component Value Date/Time   APPEARANCEUR Cloudy (A) 10/21/2021 1059   GLUCOSEU Negative 10/21/2021 1059   BILIRUBINUR Negative 10/21/2021 1059   PROTEINUR Negative 10/21/2021 1059   NITRITE Positive (A) 10/21/2021 1059   LEUKOCYTESUR 3+ (A) 10/21/2021 1059    Lab Results  Component Value Date   LABMICR Comment 10/21/2021    Pertinent Imaging:  No results found for this or any previous visit.  No results found for this or any previous visit.  No results found for this or any previous visit.  No results found for this or any previous visit.  No results found for this or any previous visit.  No results found for this or any previous visit.  No results found for this or any previous visit.  No results found for this or any previous visit.   Assessment & Plan:    1. OAB (overactive bladder) -We discussed intravesical botox. Handout given and patient will call with her decision.  - Urinalysis, Routine w reflex microscopic   No follow-ups on file.  Wilkie Aye, MD  [facility]

## 2022-03-02 ENCOUNTER — Telehealth: Payer: Self-pay

## 2022-03-02 NOTE — Telephone Encounter (Signed)
Patient wanting to go ahead with scheduling Botox procedure.  Call back:  [phone]  Thanks, Rosey Bath

## 2022-03-03 NOTE — Telephone Encounter (Signed)
Please see message below

## 2022-03-06 ENCOUNTER — Encounter: Payer: Self-pay | Admitting: Urology

## 2022-03-06 NOTE — Telephone Encounter (Signed)
Patient  called advising she wished to proceed with the Botox procedure.

## 2022-03-08 NOTE — Telephone Encounter (Signed)
Please see patient request to proceed with surgery

## 2022-03-10 ENCOUNTER — Telehealth: Payer: Self-pay

## 2022-03-10 NOTE — Telephone Encounter (Signed)
Patient is asking what the status is of her request of surgery.  Please call and advise  [phone]

## 2022-03-11 DIAGNOSIS — E6609 Other obesity due to excess calories: Secondary | ICD-10-CM | POA: Diagnosis not present

## 2022-03-11 DIAGNOSIS — E039 Hypothyroidism, unspecified: Secondary | ICD-10-CM | POA: Diagnosis not present

## 2022-03-11 DIAGNOSIS — R54 Age-related physical debility: Secondary | ICD-10-CM | POA: Diagnosis not present

## 2022-03-11 DIAGNOSIS — I10 Essential (primary) hypertension: Secondary | ICD-10-CM | POA: Diagnosis not present

## 2022-03-11 DIAGNOSIS — E782 Mixed hyperlipidemia: Secondary | ICD-10-CM | POA: Diagnosis not present

## 2022-03-11 DIAGNOSIS — I2510 Atherosclerotic heart disease of native coronary artery without angina pectoris: Secondary | ICD-10-CM | POA: Diagnosis not present

## 2022-03-11 NOTE — Telephone Encounter (Signed)
Patient called and advised that surgery has been scheduled. Date of surgery given. Patient advised that she should receive a call from hospital with further instructions.

## 2022-03-11 NOTE — Telephone Encounter (Signed)
See below

## 2022-03-12 ENCOUNTER — Encounter: Payer: Self-pay | Admitting: Urology

## 2022-03-12 NOTE — Patient Instructions (Signed)

## 2022-03-13 DIAGNOSIS — G4733 Obstructive sleep apnea (adult) (pediatric): Secondary | ICD-10-CM | POA: Diagnosis not present

## 2022-03-16 NOTE — Patient Instructions (Signed)
Your procedure is scheduled on: 03/23/2022  Report to [facility] Main Entrance at   9:00  AM.  Call this number if you have problems the morning of surgery: [phone]   Remember:   Do not Eat or Drink after midnight         No Smoking the morning of surgery  :  Take these medicines the morning of surgery with A SIP OF WATER: Isosorbide, Bisoprolol, levothyroxine, paxil, and claritin if needed   Do not wear jewelry, make-up or nail polish.  Do not wear lotions, powders, or perfumes. You may wear deodorant.  Do not shave 48 hours prior to surgery. Men may shave face and neck.  Do not bring valuables to the hospital.  Contacts, dentures or bridgework may not be worn into surgery.  Leave suitcase in the car. After surgery it may be brought to your room.  For patients admitted to the hospital, checkout time is 11:00 AM the day of discharge.   Patients discharged the day of surgery will not be allowed to drive home.    Special Instructions: Shower using CHG night before surgery and shower the day of surgery use CHG.  Use special wash - you have one bottle of CHG for all showers.  You should use approximately 1/2 of the bottle for each shower.  How to Use Chlorhexidine for Bathing Chlorhexidine gluconate (CHG) is a germ-killing (antiseptic) solution that is used to clean the skin. It can get rid of the bacteria that normally live on the skin and can keep them away for about 24 hours. To clean your skin with CHG, you may be given: A CHG solution to use in the shower or as part of a sponge bath. A prepackaged cloth that contains CHG. Cleaning your skin with CHG may help lower the risk for infection: While you are staying in the intensive care unit of the hospital. If you have a vascular access, such as a central line, to provide short-term or long-term access to your veins. If you have a catheter to drain urine from your bladder. If you are on a ventilator. A ventilator is a machine that  helps you breathe by moving air in and out of your lungs. After surgery. What are the risks? Risks of using CHG include: A skin reaction. Hearing loss, if CHG gets in your ears and you have a perforated eardrum. Eye injury, if CHG gets in your eyes and is not rinsed out. The CHG product catching fire. Make sure that you avoid smoking and flames after applying CHG to your skin. Do not use CHG: If you have a chlorhexidine allergy or have previously reacted to chlorhexidine. On babies younger than 70 months of age. How to use CHG solution Use CHG only as told by your health care provider, and follow the instructions on the label. Use the full amount of CHG as directed. Usually, this is one bottle. During a shower Follow these steps when using CHG solution during a shower (unless your health care provider gives you different instructions): Start the shower. Use your normal soap and shampoo to wash your face and hair. Turn off the shower or move out of the shower stream. Pour the CHG onto a clean washcloth. Do not use any type of brush or rough-edged sponge. Starting at your neck, lather your body down to your toes. Make sure you follow these instructions: If you will be having surgery, pay special attention to the part of your body  where you will be having surgery. Scrub this area for at least 1 minute. Do not use CHG on your head or face. If the solution gets into your ears or eyes, rinse them well with water. Avoid your genital area. Avoid any areas of skin that have broken skin, cuts, or scrapes. Scrub your back and under your arms. Make sure to wash skin folds. Let the lather sit on your skin for 1-2 minutes or as long as told by your health care provider. Thoroughly rinse your entire body in the shower. Make sure that all body creases and crevices are rinsed well. Dry off with a clean towel. Do not put any substances on your body afterward--such as powder, lotion, or perfume--unless you  are told to do so by your health care provider. Only use lotions that are recommended by the manufacturer. Put on clean clothes or pajamas. If it is the night before your surgery, sleep in clean sheets.  During a sponge bath Follow these steps when using CHG solution during a sponge bath (unless your health care provider gives you different instructions): Use your normal soap and shampoo to wash your face and hair. Pour the CHG onto a clean washcloth. Starting at your neck, lather your body down to your toes. Make sure you follow these instructions: If you will be having surgery, pay special attention to the part of your body where you will be having surgery. Scrub this area for at least 1 minute. Do not use CHG on your head or face. If the solution gets into your ears or eyes, rinse them well with water. Avoid your genital area. Avoid any areas of skin that have broken skin, cuts, or scrapes. Scrub your back and under your arms. Make sure to wash skin folds. Let the lather sit on your skin for 1-2 minutes or as long as told by your health care provider. Using a different clean, wet washcloth, thoroughly rinse your entire body. Make sure that all body creases and crevices are rinsed well. Dry off with a clean towel. Do not put any substances on your body afterward--such as powder, lotion, or perfume--unless you are told to do so by your health care provider. Only use lotions that are recommended by the manufacturer. Put on clean clothes or pajamas. If it is the night before your surgery, sleep in clean sheets. How to use CHG prepackaged cloths Only use CHG cloths as told by your health care provider, and follow the instructions on the label. Use the CHG cloth on clean, dry skin. Do not use the CHG cloth on your head or face unless your health care provider tells you to. When washing with the CHG cloth: Avoid your genital area. Avoid any areas of skin that have broken skin, cuts, or  scrapes. Before surgery Follow these steps when using a CHG cloth to clean before surgery (unless your health care provider gives you different instructions): Using the CHG cloth, vigorously scrub the part of your body where you will be having surgery. Scrub using a back-and-forth motion for 3 minutes. The area on your body should be completely wet with CHG when you are done scrubbing. Do not rinse. Discard the cloth and let the area air-dry. Do not put any substances on the area afterward, such as powder, lotion, or perfume. Put on clean clothes or pajamas. If it is the night before your surgery, sleep in clean sheets.  For general bathing Follow these steps when using CHG cloths for  general bathing (unless your health care provider gives you different instructions). Use a separate CHG cloth for each area of your body. Make sure you wash between any folds of skin and between your fingers and toes. Wash your body in the following order, switching to a new cloth after each step: The front of your neck, shoulders, and chest. Both of your arms, under your arms, and your hands. Your stomach and groin area, avoiding the genitals. Your right leg and foot. Your left leg and foot. The back of your neck, your back, and your buttocks. Do not rinse. Discard the cloth and let the area air-dry. Do not put any substances on your body afterward--such as powder, lotion, or perfume--unless you are told to do so by your health care provider. Only use lotions that are recommended by the manufacturer. Put on clean clothes or pajamas. Contact a health care provider if: Your skin gets irritated after scrubbing. You have questions about using your solution or cloth. You swallow any chlorhexidine. Call your local poison control center ([phone] in the [place] Get help right away if: Your eyes itch badly, or they become very red or swollen. Your skin itches badly and is red or swollen. Your hearing  changes. You have trouble seeing. You have swelling or tingling in your mouth or throat. You have trouble breathing. These symptoms may represent a serious problem that is an emergency. Do not wait to see if the symptoms will go away. Get medical help right away. Call your local emergency services (911 in the [place] Do not drive yourself to the hospital. Summary Chlorhexidine gluconate (CHG) is a germ-killing (antiseptic) solution that is used to clean the skin. Cleaning your skin with CHG may help to lower your risk for infection. You may be given CHG to use for bathing. It may be in a bottle or in a prepackaged cloth to use on your skin. Carefully follow your health care provider's instructions and the instructions on the product label. Do not use CHG if you have a chlorhexidine allergy. Contact your health care provider if your skin gets irritated after scrubbing. This information is not intended to replace advice given to you by your health care provider. Make sure you discuss any questions you have with your health care provider. Document Revised: 10/21/2020 Document Reviewed: 10/21/2020 Elsevier Patient Education  2023 [employer] Botulinum Toxin Bladder Injection  A botulinum toxin bladder injection is a procedure to treat an overactive bladder. During the procedure, a drug called botulinum toxin is injected into the bladder through a long, thin needle. This drug relaxes the bladder muscles and reduces overactivity. You may need this procedure if your medicines are not working or you cannot take them. The procedure may be repeated as needed. The treatment is done once and it usually lasts for 6 months. Your health care provider will monitor you to see how well you respond. Tell a health care provider about: Any allergies you have. All medicines you are taking, including vitamins, herbs, eye drops, creams, and over-the-counter medicines. Any problems you or family members have had with  anesthetic medicines. Any bleeding problems you have. Any surgeries you have had. Any medical conditions you have. Any previous reactions to a botulinum toxin injection. Any symptoms of urinary tract infection. These include chills, fever, a burning feeling when passing urine, and needing to pass urine often. Whether you are pregnant or may be pregnant. What are the risks? Generally this is a safe procedure. However, problems  may occur, including: Not being able to pass urine. If this happens, you may need to have your bladder emptied with a thin tube (urinary catheter). Bleeding. Urinary tract infection. Allergic reaction to the botulinum toxin. Pain or burning when passing urine. Damage to nearby structures or organs. What happens before the procedure? When to stop eating and drinking Follow instructions from your health care provider about what you may eat and drink before your procedure. These may include: 8 hours before the procedure Stop eating most foods. Do not eat meat, fried foods, or fatty foods. Eat only light foods, such as toast or crackers. All liquids are okay except energy drinks and alcohol. 6 hours before the procedure Stop eating. Drink only clear liquids, such as water, clear fruit juice, black coffee, plain tea, and sports drinks. Do not drink energy drinks or alcohol. 2 hours before the procedure Stop drinking all liquids. You may be allowed to take medicines with small sips of water. If you do not follow your health care provider's instructions, your procedure may be delayed or canceled. Medicines Ask your health care provider about: Changing or stopping your regular medicines. This is especially important if you are taking diabetes medicines or blood thinners. Taking medicines such as aspirin and ibuprofen. These medicines can thin your blood. Do not take these medicines unless your health care provider tells you to take them. Taking over-the-counter  medicines, vitamins, herbs, and supplements. General instructions Ask your health care provider what steps will be taken to help prevent infection. These steps may include: Removing hair at the procedure site. Washing skin with a germ-killing soap. Taking antibiotic medicine. If you will be going home right after the procedure, plan to have a responsible adult: Take you home from the hospital or clinic. You will not be allowed to drive. Care for you for the time you are told. What happens during the procedure?  You will be asked to empty your bladder. An IV will be inserted into one of your veins. You will be given one or more of the following: A medicine to help you relax (sedative). A medicine to numb the area (local anesthetic). A medicine to make you fall asleep (general anesthetic). A long, thin scope called a cystoscope will be passed into your bladder through the part of the body that carries urine from your bladder (urethra). The cystoscope will be used to fill your bladder with water. A long needle will be passed through the cystoscope and into the bladder. The botulinum toxin will be injected into your bladder. It may be injected into multiple areas of your bladder. The cystoscope will be removed and your bladder will be emptied with a urinary catheter. The procedure may vary among health care providers and hospitals. What can I expect after the procedure? After your procedure, it is common to have: Blood-tinged urine. Burning or soreness when you pass urine. Follow these instructions at home: Medicines Take over-the-counter and prescription medicines only as told by your health care provider. If you were prescribed an antibiotic medicine, take it as told by your health care provider. Do not stop using the antibiotic even if you start to feel better. General instructions  If you were given a sedative during the procedure, it can affect you for several hours. Do not drive or  operate machinery until your health care provider says that it is safe. Drink enough fluid to keep your urine pale yellow. Return to your normal activities as told by your health  care provider. Ask your health care provider what activities are safe for you. Keep all follow-up visits. Contact a health care provider if you have: A fever or chills. Blood-tinged urine for more than one day after your procedure. Worsening pain or burning when you pass urine. Pain or burning when passing urine for more than two days after your procedure. Trouble emptying your bladder. Get help right away if you: Have bright red blood in your urine. Are unable to pass urine. Summary A botulinum toxin bladder injection is a procedure to treat an overactive bladder. This is generally a safe procedure. However, problems may occur, including not being able to pass urine, bleeding, infection, pain, and an allergic reaction to the botulinum toxin. You will be told when to stop eating and drinking, and what medicines to change or stop. Follow instructions carefully. After the procedure, it is common to have blood in your urine and to have soreness or burning when passing urine. Contact a health care provider if you have a fever, blood in your urine for more than a few days, or trouble passing urine. Get help right away if you have bright red blood in your urine, or if you are unable to pass urine. This information is not intended to replace advice given to you by your health care provider. Make sure you discuss any questions you have with your health care provider. Document Revised: 02/14/2021 Document Reviewed: 02/14/2021 Elsevier Patient Education  2023 [employer] Inc. General Anesthesia, Adult, Care After This sheet gives you information about how to care for yourself after your procedure. Your health care provider may also give you more specific instructions. If you have problems or questions, contact your health care  provider. What can I expect after the procedure? After the procedure, the following side effects are common: Pain or discomfort at the IV site. Nausea. Vomiting. Sore throat. Trouble concentrating. Feeling cold or chills. Feeling weak or tired. Sleepiness and fatigue. Soreness and body aches. These side effects can affect parts of the body that were not involved in surgery. Follow these instructions at home: For the time period you were told by your health care provider:  Rest. Do not participate in activities where you could fall or become injured. Do not drive or use machinery. Do not drink alcohol. Do not take sleeping pills or medicines that cause drowsiness. Do not make important decisions or sign legal documents. Do not take care of children on your own. Eating and drinking Follow any instructions from your health care provider about eating or drinking restrictions. When you feel hungry, start by eating small amounts of foods that are soft and easy to digest (bland), such as toast. Gradually return to your regular diet. Drink enough fluid to keep your urine pale yellow. If you vomit, rehydrate by drinking water, juice, or clear broth. General instructions If you have sleep apnea, surgery and certain medicines can increase your risk for breathing problems. Follow instructions from your health care provider about wearing your sleep device: Anytime you are sleeping, including during daytime naps. While taking prescription pain medicines, sleeping medicines, or medicines that make you drowsy. Have a responsible adult stay with you for the time you are told. It is important to have someone help care for you until you are awake and alert. Return to your normal activities as told by your health care provider. Ask your health care provider what activities are safe for you. Take over-the-counter and prescription medicines only as told by your  health care provider. If you smoke, do not  smoke without supervision. Keep all follow-up visits as told by your health care provider. This is important. Contact a health care provider if: You have nausea or vomiting that does not get better with medicine. You cannot eat or drink without vomiting. You have pain that does not get better with medicine. You are unable to pass urine. You develop a skin rash. You have a fever. You have redness around your IV site that gets worse. Get help right away if: You have difficulty breathing. You have chest pain. You have blood in your urine or stool, or you vomit blood. Summary After the procedure, it is common to have a sore throat or nausea. It is also common to feel tired. Have a responsible adult stay with you for the time you are told. It is important to have someone help care for you until you are awake and alert. When you feel hungry, start by eating small amounts of foods that are soft and easy to digest (bland), such as toast. Gradually return to your regular diet. Drink enough fluid to keep your urine pale yellow. Return to your normal activities as told by your health care provider. Ask your health care provider what activities are safe for you. This information is not intended to replace advice given to you by your health care provider. Make sure you discuss any questions you have with your health care provider. Document Revised: 04/25/2020 Document Reviewed: 11/23/2019 Elsevier Patient Education  2023 [employer].

## 2022-03-18 ENCOUNTER — Encounter (HOSPITAL_COMMUNITY)
Admission: RE | Admit: 2022-03-18 | Discharge: 2022-03-18 | Disposition: A | Discharge status: Home / Self Care | Payer: PPO | Source: Ambulatory Visit | Attending: Urology | Admitting: Urology

## 2022-03-18 ENCOUNTER — Encounter (HOSPITAL_COMMUNITY): Payer: Self-pay

## 2022-03-18 VITALS — BP 125/60 | HR 61 | Temp 97.6°F | Resp 18 | Ht 62.0 in | Wt 206.8 lb

## 2022-03-18 DIAGNOSIS — I10 Essential (primary) hypertension: Secondary | ICD-10-CM | POA: Insufficient documentation

## 2022-03-18 DIAGNOSIS — Z01818 Encounter for other preprocedural examination: Secondary | ICD-10-CM | POA: Insufficient documentation

## 2022-03-18 DIAGNOSIS — N289 Disorder of kidney and ureter, unspecified: Secondary | ICD-10-CM | POA: Diagnosis not present

## 2022-03-18 DIAGNOSIS — I2510 Atherosclerotic heart disease of native coronary artery without angina pectoris: Secondary | ICD-10-CM

## 2022-03-18 HISTORY — DX: Sleep apnea, unspecified: G47.30

## 2022-03-18 HISTORY — DX: Personal history of urinary calculi: Z87.442

## 2022-03-18 LAB — BASIC METABOLIC PANEL
Anion gap: 8 (ref 5–15)
BUN: 21 mg/dL (ref 8–23)
CO2: 25 mmol/L (ref 22–32)
Calcium: 9.5 mg/dL (ref 8.9–10.3)
Chloride: 107 mmol/L (ref 98–111)
Creatinine, Ser: 1.35 mg/dL — ABNORMAL HIGH (ref 0.44–1.00)
GFR, Estimated: 40 mL/min — ABNORMAL LOW (ref >60)
Glucose, Bld: 131 mg/dL — ABNORMAL HIGH (ref 70–99)
Potassium: 4.2 mmol/L (ref 3.5–5.1)
Sodium: 140 mmol/L (ref 135–145)

## 2022-03-23 ENCOUNTER — Ambulatory Visit (HOSPITAL_BASED_OUTPATIENT_CLINIC_OR_DEPARTMENT_OTHER): Payer: PPO | Admitting: Certified Registered"

## 2022-03-23 ENCOUNTER — Encounter (HOSPITAL_COMMUNITY)
Admission: RE | Disposition: A | Discharge status: Home / Self Care | Payer: Self-pay | Source: Home / Self Care | Attending: Urology

## 2022-03-23 ENCOUNTER — Ambulatory Visit (HOSPITAL_COMMUNITY): Payer: PPO | Admitting: Certified Registered"

## 2022-03-23 ENCOUNTER — Ambulatory Visit (HOSPITAL_COMMUNITY)
Admission: RE | Admit: 2022-03-23 | Discharge: 2022-03-23 | Disposition: A | Discharge status: Home / Self Care | Payer: PPO | Attending: Urology | Admitting: Urology

## 2022-03-23 ENCOUNTER — Encounter (HOSPITAL_COMMUNITY): Payer: Self-pay | Admitting: Urology

## 2022-03-23 DIAGNOSIS — Z79890 Hormone replacement therapy (postmenopausal): Secondary | ICD-10-CM | POA: Insufficient documentation

## 2022-03-23 DIAGNOSIS — I10 Essential (primary) hypertension: Secondary | ICD-10-CM

## 2022-03-23 DIAGNOSIS — I2510 Atherosclerotic heart disease of native coronary artery without angina pectoris: Secondary | ICD-10-CM | POA: Diagnosis not present

## 2022-03-23 DIAGNOSIS — E039 Hypothyroidism, unspecified: Secondary | ICD-10-CM | POA: Insufficient documentation

## 2022-03-23 DIAGNOSIS — N3281 Overactive bladder: Secondary | ICD-10-CM | POA: Diagnosis not present

## 2022-03-23 DIAGNOSIS — J45909 Unspecified asthma, uncomplicated: Secondary | ICD-10-CM | POA: Diagnosis not present

## 2022-03-23 DIAGNOSIS — I25709 Atherosclerosis of coronary artery bypass graft(s), unspecified, with unspecified angina pectoris: Secondary | ICD-10-CM | POA: Diagnosis not present

## 2022-03-23 DIAGNOSIS — G4733 Obstructive sleep apnea (adult) (pediatric): Secondary | ICD-10-CM | POA: Diagnosis not present

## 2022-03-23 DIAGNOSIS — F32A Depression, unspecified: Secondary | ICD-10-CM | POA: Insufficient documentation

## 2022-03-23 DIAGNOSIS — Z8673 Personal history of transient ischemic attack (TIA), and cerebral infarction without residual deficits: Secondary | ICD-10-CM | POA: Diagnosis not present

## 2022-03-23 DIAGNOSIS — I25119 Atherosclerotic heart disease of native coronary artery with unspecified angina pectoris: Secondary | ICD-10-CM | POA: Diagnosis not present

## 2022-03-23 HISTORY — PX: CYSTOSCOPY WITH INJECTION: SHX1424

## 2022-03-23 HISTORY — PX: BOTOX INJECTION: SHX5754

## 2022-03-23 SURGERY — CYSTOSCOPY, WITH INJECTION OF BLADDER NECK OR BLADDER WALL
Anesthesia: General | Site: Bladder

## 2022-03-23 MED ORDER — ONDANSETRON HCL 4 MG/2ML IJ SOLN
INTRAMUSCULAR | Status: AC
  Filled 2022-03-23: qty 2

## 2022-03-23 MED ORDER — OXYCODONE HCL 5 MG PO TABS
ORAL_TABLET | ORAL | Status: AC
  Filled 2022-03-23: qty 1

## 2022-03-23 MED ORDER — ONABOTULINUMTOXINA 100 UNITS IJ SOLR
100.0000 [IU] | Freq: Once | INTRAMUSCULAR | Status: DC
  Filled 2022-03-23: qty 100

## 2022-03-23 MED ORDER — FENTANYL CITRATE (PF) 100 MCG/2ML IJ SOLN
INTRAMUSCULAR | Status: AC
  Filled 2022-03-23: qty 2

## 2022-03-23 MED ORDER — GLYCOPYRROLATE 0.2 MG/ML IJ SOLN
INTRAMUSCULAR | Status: DC | PRN
  Administered 2022-03-23: .2 mg via INTRAVENOUS

## 2022-03-23 MED ORDER — ONDANSETRON HCL 4 MG/2ML IJ SOLN: 4.0000 mg | Freq: Once | INTRAMUSCULAR | Status: DC | PRN

## 2022-03-23 MED ORDER — FENTANYL CITRATE (PF) 100 MCG/2ML IJ SOLN
INTRAMUSCULAR | Status: DC | PRN
  Administered 2022-03-23 (×2): 25 ug via INTRAVENOUS

## 2022-03-23 MED ORDER — ONABOTULINUMTOXINA 100 UNITS IJ SOLR
INTRAMUSCULAR | Status: DC | PRN
  Administered 2022-03-23: 100 [IU] via INTRAMUSCULAR

## 2022-03-23 MED ORDER — PHENYLEPHRINE 80 MCG/ML (10ML) SYRINGE FOR IV PUSH (FOR BLOOD PRESSURE SUPPORT)
PREFILLED_SYRINGE | INTRAVENOUS | Status: DC | PRN
  Administered 2022-03-23 (×2): 80 ug via INTRAVENOUS

## 2022-03-23 MED ORDER — LIDOCAINE 2% (20 MG/ML) 5 ML SYRINGE
INTRAMUSCULAR | Status: DC | PRN
  Administered 2022-03-23: 100 mg via INTRAVENOUS

## 2022-03-23 MED ORDER — PHENYLEPHRINE 80 MCG/ML (10ML) SYRINGE FOR IV PUSH (FOR BLOOD PRESSURE SUPPORT)
PREFILLED_SYRINGE | INTRAVENOUS | Status: AC
  Filled 2022-03-23: qty 10

## 2022-03-23 MED ORDER — FENTANYL CITRATE PF 50 MCG/ML IJ SOSY: 25.0000 ug | PREFILLED_SYRINGE | INTRAMUSCULAR | Status: DC | PRN

## 2022-03-23 MED ORDER — WATER FOR IRRIGATION, STERILE IR SOLN
Status: DC | PRN
  Administered 2022-03-23: 3000 mL

## 2022-03-23 MED ORDER — ORAL CARE MOUTH RINSE: 15.0000 mL | Freq: Once | OROMUCOSAL | Status: AC

## 2022-03-23 MED ORDER — CEFAZOLIN SODIUM-DEXTROSE 2-4 GM/100ML-% IV SOLN
2.0000 g | INTRAVENOUS | Status: AC
  Administered 2022-03-23: 2 g via INTRAVENOUS
  Filled 2022-03-23: qty 100

## 2022-03-23 MED ORDER — PROPOFOL 10 MG/ML IV BOLUS
INTRAVENOUS | Status: DC | PRN
  Administered 2022-03-23: 200 mg via INTRAVENOUS

## 2022-03-23 MED ORDER — ONDANSETRON HCL 4 MG/2ML IJ SOLN
INTRAMUSCULAR | Status: DC | PRN
  Administered 2022-03-23: 4 mg via INTRAVENOUS

## 2022-03-23 MED ORDER — LIDOCAINE HCL (PF) 2 % IJ SOLN
INTRAMUSCULAR | Status: AC
  Filled 2022-03-23: qty 5

## 2022-03-23 MED ORDER — SODIUM CHLORIDE (PF) 0.9 % IJ SOLN
INTRAMUSCULAR | Status: DC | PRN
  Administered 2022-03-23: 20 mL via INTRAVENOUS

## 2022-03-23 MED ORDER — SODIUM CHLORIDE (PF) 0.9 % IJ SOLN
INTRAMUSCULAR | Status: AC
  Filled 2022-03-23: qty 20

## 2022-03-23 MED ORDER — OXYCODONE HCL 5 MG PO TABS
5.0000 mg | ORAL_TABLET | Freq: Once | ORAL | Status: AC | PRN
  Administered 2022-03-23: 5 mg via ORAL

## 2022-03-23 MED ORDER — LACTATED RINGERS IV SOLN: INTRAVENOUS | Status: DC

## 2022-03-23 MED ORDER — OXYCODONE HCL 5 MG/5ML PO SOLN: 5.0000 mg | Freq: Once | ORAL | Status: AC | PRN

## 2022-03-23 MED ORDER — CHLORHEXIDINE GLUCONATE 0.12 % MT SOLN
15.0000 mL | Freq: Once | OROMUCOSAL | Status: AC
  Administered 2022-03-23: 15 mL via OROMUCOSAL

## 2022-03-23 MED ORDER — EPHEDRINE SULFATE-NACL 50-0.9 MG/10ML-% IV SOSY
PREFILLED_SYRINGE | INTRAVENOUS | Status: DC | PRN
  Administered 2022-03-23: 10 mg via INTRAVENOUS

## 2022-03-23 MED ORDER — PROPOFOL 10 MG/ML IV BOLUS
INTRAVENOUS | Status: AC
  Filled 2022-03-23: qty 20

## 2022-03-23 SURGICAL SUPPLY — 21 items
BAG DRAIN URO TABLE W/ADPT NS (BAG) ×2 IMPLANT
BAG HAMPER (MISCELLANEOUS) ×2 IMPLANT
CLOTH BEACON ORANGE TIMEOUT ST (SAFETY) ×2 IMPLANT
GLOVE BIO SURGEON STRL SZ8 (GLOVE) ×2 IMPLANT
GLOVE BIOGEL PI IND STRL 7.0 (GLOVE) ×2 IMPLANT
GLOVE BIOGEL PI INDICATOR 7.0 (GLOVE) ×2
GOWN STRL REUS W/TWL LRG LVL3 (GOWN DISPOSABLE) ×2 IMPLANT
GOWN STRL REUS W/TWL XL LVL3 (GOWN DISPOSABLE) ×2 IMPLANT
KIT TURNOVER CYSTO (KITS) ×2 IMPLANT
MANIFOLD NEPTUNE II (INSTRUMENTS) ×2 IMPLANT
NDL ASPIRATION 22 (NEEDLE) ×1 IMPLANT
NDL HYPO 18GX1.5 BLUNT FILL (NEEDLE) ×1 IMPLANT
NEEDLE ASPIRATION 22 (NEEDLE) ×2 IMPLANT
NEEDLE HYPO 18GX1.5 BLUNT FILL (NEEDLE) ×2 IMPLANT
PACK CYSTO (CUSTOM PROCEDURE TRAY) ×2 IMPLANT
PAD ARMBOARD 7.5X6 YLW CONV (MISCELLANEOUS) ×2 IMPLANT
SYR 30ML LL (SYRINGE) ×3 IMPLANT
SYR CONTROL 10ML LL (SYRINGE) ×2 IMPLANT
TOWEL OR 17X26 4PK STRL BLUE (TOWEL DISPOSABLE) ×2 IMPLANT
WATER STERILE IRR 3000ML UROMA (IV SOLUTION) ×2 IMPLANT
WATER STERILE IRR 500ML POUR (IV SOLUTION) ×2 IMPLANT

## 2022-03-23 NOTE — Anesthesia Preprocedure Evaluation (Signed)
Anesthesia Evaluation  Patient identified by MRN, date of birth, ID band Patient awake    Reviewed: Allergy & Precautions, H&P , NPO status , Patient's Chart, lab work & pertinent test results, reviewed documented beta blocker date and time   Airway Mallampati: II  TM Distance: >3 FB Neck ROM: full    Dental no notable dental hx.    Pulmonary asthma , sleep apnea ,    Pulmonary exam normal breath sounds clear to auscultation       Cardiovascular Exercise Tolerance: Good hypertension, + angina + CAD   Rhythm:regular Rate:Normal     Neuro/Psych PSYCHIATRIC DISORDERS Depression TIA   GI/Hepatic negative GI ROS, Neg liver ROS,   Endo/Other  Hypothyroidism   Renal/GU negative Renal ROS  negative genitourinary   Musculoskeletal   Abdominal   Peds  Hematology negative hematology ROS (+)   Anesthesia Other Findings   Reproductive/Obstetrics negative OB ROS                             Anesthesia Physical Anesthesia Plan  ASA: 3  Anesthesia Plan: General and General LMA   Post-op Pain Management:    Induction:   PONV Risk Score and Plan: Ondansetron  Airway Management Planned:   Additional Equipment:   Intra-op Plan:   Post-operative Plan:   Informed Consent: I have reviewed the patients History and Physical, chart, labs and discussed the procedure including the risks, benefits and alternatives for the proposed anesthesia with the patient or authorized representative who has indicated his/her understanding and acceptance.     Dental Advisory Given  Plan Discussed with: CRNA  Anesthesia Plan Comments:         Anesthesia Quick Evaluation

## 2022-03-23 NOTE — Interval H&P Note (Signed)
History and Physical Interval Note:  03/23/2022 10:52 AM  Lacey Bailey  has presented today for surgery, with the diagnosis of OAB.  The various methods of treatment have been discussed with the patient and family. After consideration of risks, benefits and other options for treatment, the patient has consented to  Procedure(s): CYSTOSCOPY WITH INJECTION (N/A) BOTOX INJECTION- 100 units (N/A) as a surgical intervention.  The patient's history has been reviewed, patient examined, no change in status, stable for surgery.  I have reviewed the patient's chart and labs.  Questions were answered to the patient's satisfaction.     Wilkie Aye

## 2022-03-23 NOTE — Op Note (Signed)
Preoperative diagnosis: overactive bladder  Postoperative diagnosis: same  Procedure: 1 cystoscopy 2. Intravesical botox injection 100 units  Attending: Wilkie Aye  Anesthesia: General  Estimated blood loss: Minimal  Drains: none  Specimens: none  Antibiotics: ancef  Findings:  Ureteral orifices in normal anatomic location.  No masses/lesions in the bladder  Indications: Patient is a 76 year old female with a history of overactive bladder and urinary leakage refractory to anticholinergic therapy.  After discussing treatment options, they decided proceed with intravesical botox injection.  Procedure in detail: The patient was brought to the operating room and a brief timeout was done to ensure correct patient, correct procedure, correct site.  General anesthesia was administered patient was placed in dorsal lithotomy position.  Their genitalia was then prepped and draped in usual sterile fashion.  A rigid 22 French cystoscope was passed in the urethra and the bladder.  Bladder was inspected and we noted no masses or lesions.  the ureteral orifices were in the normal orthotopic locations. Using a deflux injection needle we proceed to inject 100 units in a grid pattern between the ureteral orifices starting at the trigone to the mid posterior wall. We injected a total of 20 sites with 5 units per injection. The bladder was then drained and this concluded the procedure which was well tolerated by patient.  Complications: None  Condition: Stable, extubated, transferred to PACU  Plan: Patient is to be discharge home. They will followup in 1 month

## 2022-03-23 NOTE — Transfer of Care (Signed)
Immediate Anesthesia Transfer of Care Note  Patient: Lacey Bailey  Procedure(s) Performed: CYSTOSCOPY WITH INJECTION (Bladder) BOTOX INJECTION- 100 units (Bladder)  Patient Location: PACU  Anesthesia Type:General  Level of Consciousness: awake and patient cooperative  Airway & Oxygen Therapy: Patient Spontanous Breathing  Post-op Assessment: Report given to RN and Post -op Vital signs reviewed and stable  Post vital signs: Reviewed and stable  Last Vitals:  Vitals Value Taken Time  BP 140/77 03/23/22 1208  Temp    Pulse 70 03/23/22 1208  Resp 10 03/23/22 1208  SpO2 100 % 03/23/22 1208  Vitals shown include unvalidated device data.  Last Pain:  Vitals:   03/23/22 1021  TempSrc: Oral  PainSc: 0-No pain      Patients Stated Pain Goal: 6 (03/23/22 1021)  Complications: No notable events documented.

## 2022-03-23 NOTE — Anesthesia Procedure Notes (Signed)
Procedure Name: LMA Insertion Date/Time: 03/23/2022 11:27 AM  Performed by: Marny Lowenstein, CRNAPre-anesthesia Checklist: Patient identified, Emergency Drugs available, Suction available and Patient being monitored Patient Re-evaluated:Patient Re-evaluated prior to induction Oxygen Delivery Method: Circle system utilized Preoxygenation: Pre-oxygenation with 100% oxygen Induction Type: IV induction Ventilation: Mask ventilation without difficulty LMA: LMA inserted LMA Size: 4.0 Number of attempts: 1 Placement Confirmation: positive ETCO2 and breath sounds checked- equal and bilateral Tube secured with: Tape Dental Injury: Teeth and Oropharynx as per pre-operative assessment

## 2022-03-24 ENCOUNTER — Encounter (HOSPITAL_COMMUNITY): Payer: Self-pay | Admitting: Urology

## 2022-03-25 NOTE — Anesthesia Postprocedure Evaluation (Signed)
Anesthesia Post Note  Patient: Lacey Bailey  Procedure(s) Performed: CYSTOSCOPY WITH INJECTION (Bladder) BOTOX INJECTION- 100 units (Bladder)  Patient location during evaluation: Phase II Anesthesia Type: General Level of consciousness: awake Pain management: pain level controlled Vital Signs Assessment: post-procedure vital signs reviewed and stable Respiratory status: spontaneous breathing and respiratory function stable Cardiovascular status: blood pressure returned to baseline and stable Postop Assessment: no headache and no apparent nausea or vomiting Anesthetic complications: no Comments: Late entry   No notable events documented.   Last Vitals:  Vitals:   03/23/22 1245 03/23/22 1311  BP: (!) 142/75 132/77  Pulse: 64 86  Resp: (!) 8 15  Temp:  36.6 C  SpO2:  99%    Last Pain:  Vitals:   03/23/22 1311  TempSrc: Oral  PainSc:                  Windell Norfolk

## 2022-03-31 DIAGNOSIS — F32A Depression, unspecified: Secondary | ICD-10-CM | POA: Diagnosis not present

## 2022-03-31 DIAGNOSIS — I10 Essential (primary) hypertension: Secondary | ICD-10-CM | POA: Diagnosis not present

## 2022-03-31 DIAGNOSIS — E6609 Other obesity due to excess calories: Secondary | ICD-10-CM | POA: Diagnosis not present

## 2022-03-31 DIAGNOSIS — N3281 Overactive bladder: Secondary | ICD-10-CM | POA: Diagnosis not present

## 2022-03-31 DIAGNOSIS — G4733 Obstructive sleep apnea (adult) (pediatric): Secondary | ICD-10-CM | POA: Diagnosis not present

## 2022-03-31 DIAGNOSIS — Z9989 Dependence on other enabling machines and devices: Secondary | ICD-10-CM | POA: Diagnosis not present

## 2022-03-31 DIAGNOSIS — Z7982 Long term (current) use of aspirin: Secondary | ICD-10-CM | POA: Diagnosis not present

## 2022-03-31 DIAGNOSIS — I25709 Atherosclerosis of coronary artery bypass graft(s), unspecified, with unspecified angina pectoris: Secondary | ICD-10-CM | POA: Diagnosis not present

## 2022-03-31 DIAGNOSIS — Z6836 Body mass index (BMI) 36.0-36.9, adult: Secondary | ICD-10-CM | POA: Diagnosis not present

## 2022-03-31 DIAGNOSIS — R54 Age-related physical debility: Secondary | ICD-10-CM | POA: Diagnosis not present

## 2022-03-31 DIAGNOSIS — Z9862 Peripheral vascular angioplasty status: Secondary | ICD-10-CM | POA: Diagnosis not present

## 2022-04-06 ENCOUNTER — Ambulatory Visit (INDEPENDENT_AMBULATORY_CARE_PROVIDER_SITE_OTHER): Payer: PPO | Admitting: Physician Assistant

## 2022-04-06 VITALS — BP 107/74 | HR 66 | Ht 62.5 in | Wt 205.0 lb

## 2022-04-06 DIAGNOSIS — N3281 Overactive bladder: Secondary | ICD-10-CM

## 2022-04-06 LAB — BLADDER SCAN AMB NON-IMAGING: Scan Result: 0

## 2022-04-06 NOTE — Progress Notes (Signed)
Assessment: 1. OAB (overactive bladder) - BLADDER SCAN AMB NON-IMAGING - Urinalysis, Routine w reflex microscopic    Plan: Pt would prefer to continue with general anesthesia for further treatments, but would like to think about trying an antianxiety med and proceeding in the office.  She states she would like to think about this and will let Dr. Ronne Binning know what she would like to do.  Follow-up in 6 weeks for recheck.  Chief Complaint: No chief complaint on file.   HPI: Lacey Bailey is a 76 y.o. female who presents for post op evaluation s/p botox injection for OAB.  Procedure performed on 03/23/2022.  Pt states she is doing well with much better urinary continence control and less urgency.   PVR= 45ml UA=clear  Portions of the above documentation were copied from a prior visit for review purposes only.  Allergies: No Known Allergies  PMH: Past Medical History:  Diagnosis Date   Aortic sclerosis    CAD (coronary artery disease)    a. s/p CABG in 2003 b. multiple interventions since at outside hospitals c. DES to PLB in 11/2014 d. angioplasty to mid-RCA and mid-LCx in 10/2015 with patent LIMA-LAD and occlusion of all vein grafts e. 12/2017: occlusion of vein grafts and native LCx and RCA with patent LIMA-LAD. Re-do CABG NOT recommended by CT surgery   Depression    Essential hypertension    History of kidney stones    Hyperlipidemia    Hypothyroidism    Sleep apnea    TIA (transient ischemic attack) ~ 2013    PSH: Past Surgical History:  Procedure Laterality Date   ABDOMINAL HYSTERECTOMY     APPENDECTOMY     BLEPHAROPLASTY Bilateral    "uppers"   BOTOX INJECTION N/A 03/23/2022   Procedure: BOTOX INJECTION- 100 units;  Surgeon: Malen Gauze, MD;  Location: [facility];  Service: Urology;  Laterality: N/A;   CARDIAC CATHETERIZATION  11/22/2015   CARDIAC CATHETERIZATION N/A 11/22/2015   Procedure: Left Heart Cath and Cors/Grafts Angiography;  Surgeon: Marykay Lex, MD;  Location: [facility] INVASIVE CV LAB;  Service: Cardiovascular;  Laterality: N/A;   CARDIAC CATHETERIZATION N/A 11/22/2015   Procedure: Coronary Stent Intervention;  Surgeon: Marykay Lex, MD;  Location: [facility] INVASIVE CV LAB;  Service: Cardiovascular;  Laterality: N/A;   CATARACT EXTRACTION, BILATERAL Bilateral    CESAREAN SECTION  1977   CORONARY ANGIOPLASTY WITH STENT PLACEMENT  "several"   "last one was 11/2014:  DES to the posterolateral branch /notes 11/20/2015   CORONARY ARTERY BYPASS GRAFT  2003   [place]; CABG X 4   CYSTOSCOPY WITH INJECTION N/A 03/23/2022   Procedure: CYSTOSCOPY WITH INJECTION;  Surgeon: Malen Gauze, MD;  Location: [facility];  Service: Urology;  Laterality: N/A;   DILATION AND CURETTAGE OF UTERUS     KIDNEY STONE SURGERY     "opened me up"   LAPAROSCOPIC CHOLECYSTECTOMY     LEFT HEART CATH AND CORS/GRAFTS ANGIOGRAPHY N/A 01/03/2018   Procedure: LEFT HEART CATH AND CORS/GRAFTS ANGIOGRAPHY;  Surgeon: [name], Peter M, MD;  Location: [facility] INVASIVE CV LAB;  Service: Cardiovascular;  Laterality: N/A;   TONSILLECTOMY     TUBAL LIGATION      SH: Social History   Tobacco Use   Smoking status: Never   Smokeless tobacco: Never  Vaping Use   Vaping Use: Never used  Substance Use Topics   Alcohol use: Not Currently    Alcohol/week: 0.0 standard drinks of alcohol  Comment: 11/22/2015 "nothing since ~ 2005; occasionally had a drink w/dinner before 2005"   Drug use: No    ROS: All other review of systems were reviewed and are negative except what is noted above in HPI  PE: BP 107/74   Pulse 66   Ht 5' 2.5" (1.588 m)   Wt 205 lb (93 kg)   BMI 36.90 kg/m  GENERAL APPEARANCE:  Well appearing, well developed, NAD HEENT:  Atraumatic, normocephalic NECK:  Supple. Trachea midline ABDOMEN:  Soft, non-tender, no masses, no CVAT EXTREMITIES:  Moves all extremities well. Ambulates slowly NEUROLOGIC:  Alert and oriented x 3 MENTAL STATUS:  appropriate SKIN:   Warm, dry, and intact   Results: Laboratory Data: Lab Results  Component Value Date   WBC 6.7 05/17/2020   HGB 13.9 05/17/2020   HCT 41.3 05/17/2020   MCV 91 05/17/2020   PLT 321 05/17/2020    Lab Results  Component Value Date   CREATININE 1.35 (H) 03/18/2022    Urinalysis    Component Value Date/Time   APPEARANCEUR Cloudy (A) 10/21/2021 1059   GLUCOSEU Negative 10/21/2021 1059   BILIRUBINUR Negative 10/21/2021 1059   PROTEINUR Negative 10/21/2021 1059   NITRITE Positive (A) 10/21/2021 1059   LEUKOCYTESUR 3+ (A) 10/21/2021 1059    Lab Results  Component Value Date   LABMICR Comment 10/21/2021    Pertinent Imaging: No results found for this or any previous visit.  No results found for this or any previous visit.  No results found for this or any previous visit.  No results found for this or any previous visit.  No results found for this or any previous visit.  No results found for this or any previous visit.  No results found for this or any previous visit.  No results found for this or any previous visit.  Results for orders placed or performed in visit on 04/06/22 (from the past 24 hour(s))  BLADDER SCAN AMB NON-IMAGING   Collection Time: 04/06/22 10:39 AM  Result Value Ref Range   Scan Result 0

## 2022-04-06 NOTE — Progress Notes (Unsigned)
post void residual = 0 ml

## 2022-04-11 ENCOUNTER — Other Ambulatory Visit: Payer: Self-pay | Admitting: Cardiology

## 2022-04-22 ENCOUNTER — Encounter: Payer: Self-pay | Admitting: Cardiology

## 2022-04-22 ENCOUNTER — Ambulatory Visit: Payer: PPO | Attending: Cardiology | Admitting: Cardiology

## 2022-04-22 VITALS — BP 130/80 | HR 58 | Ht 62.5 in | Wt 209.0 lb

## 2022-04-22 DIAGNOSIS — I25709 Atherosclerosis of coronary artery bypass graft(s), unspecified, with unspecified angina pectoris: Secondary | ICD-10-CM | POA: Diagnosis not present

## 2022-04-22 DIAGNOSIS — R54 Age-related physical debility: Secondary | ICD-10-CM | POA: Diagnosis not present

## 2022-04-22 DIAGNOSIS — I10 Essential (primary) hypertension: Secondary | ICD-10-CM | POA: Diagnosis not present

## 2022-04-22 DIAGNOSIS — E039 Hypothyroidism, unspecified: Secondary | ICD-10-CM | POA: Diagnosis not present

## 2022-04-22 DIAGNOSIS — E6609 Other obesity due to excess calories: Secondary | ICD-10-CM | POA: Diagnosis not present

## 2022-04-22 DIAGNOSIS — E782 Mixed hyperlipidemia: Secondary | ICD-10-CM

## 2022-04-22 DIAGNOSIS — I25119 Atherosclerotic heart disease of native coronary artery with unspecified angina pectoris: Secondary | ICD-10-CM

## 2022-04-22 NOTE — Progress Notes (Signed)
Cardiology Office Note  Date: 04/22/2022   ID: Lacey Bailey [birth year], MRN [id number]  PCP:  Darrol Jump, NP  Cardiologist:  Rozann Lesches, MD Electrophysiologist:  None   Chief Complaint  Patient presents with   Cardiac follow-up    History of Present Illness: Lacey Bailey is a 76 y.o. female last seen in December 2022.  She is here today with her husband for a follow-up visit.  Doing reasonably well in terms of ADLs.  She uses a rollator to get around when she walks any significant distance.  No palpitations, NYHA class II dyspnea with most activities.  She is fatigued most of the time.  States that she sleeps well however.  I reviewed her medications which are stable from a cardiac perspective.  Still uses as needed nitroglycerin fairly regularly.  We did discuss considering an increase in Imdur as a next step if necessary.  Past Medical History:  Diagnosis Date   Aortic sclerosis    CAD (coronary artery disease)    a. s/p CABG in 2003 b. multiple interventions since at outside hospitals c. DES to PLB in 11/2014 d. angioplasty to mid-RCA and mid-LCx in 10/2015 with patent LIMA-LAD and occlusion of all vein grafts e. 12/2017: occlusion of vein grafts and native LCx and RCA with patent LIMA-LAD. Re-do CABG NOT recommended by CT surgery   Depression    Essential hypertension    History of kidney stones    Hyperlipidemia    Hypothyroidism    Sleep apnea    TIA (transient ischemic attack) ~ 2013    Past Surgical History:  Procedure Laterality Date   ABDOMINAL HYSTERECTOMY     APPENDECTOMY     BLEPHAROPLASTY Bilateral    "uppers"   BOTOX INJECTION N/A 03/23/2022   Procedure: BOTOX INJECTION- 100 units;  Surgeon: Cleon Gustin, MD;  Location: [facility];  Service: Urology;  Laterality: N/A;   CARDIAC CATHETERIZATION  11/22/2015   CARDIAC CATHETERIZATION N/A 11/22/2015   Procedure: Left Heart Cath and Cors/Grafts Angiography;  Surgeon: Leonie Man, MD;   Location: [place] CV LAB;  Service: Cardiovascular;  Laterality: N/A;   CARDIAC CATHETERIZATION N/A 11/22/2015   Procedure: Coronary Stent Intervention;  Surgeon: Leonie Man, MD;  Location: [place] CV LAB;  Service: Cardiovascular;  Laterality: N/A;   CATARACT EXTRACTION, BILATERAL Bilateral    CESAREAN SECTION  1977   CORONARY ANGIOPLASTY WITH STENT PLACEMENT  "several"   "last one was 11/2014:  DES to the posterolateral branch /notes 11/20/2015   CORONARY ARTERY BYPASS GRAFT  2003   [place]; CABG X 4   CYSTOSCOPY WITH INJECTION N/A 03/23/2022   Procedure: CYSTOSCOPY WITH INJECTION;  Surgeon: Cleon Gustin, MD;  Location: [facility];  Service: Urology;  Laterality: N/A;   DILATION AND CURETTAGE OF UTERUS     KIDNEY STONE SURGERY     "opened me up"   LAPAROSCOPIC CHOLECYSTECTOMY     LEFT HEART CATH AND CORS/GRAFTS ANGIOGRAPHY N/A 01/03/2018   Procedure: LEFT HEART CATH AND CORS/GRAFTS ANGIOGRAPHY;  Surgeon: Martinique, Peter M, MD;  Location: [place] CV LAB;  Service: Cardiovascular;  Laterality: N/A;   TONSILLECTOMY     TUBAL LIGATION      Current Outpatient Medications  Medication Sig Dispense Refill   acetaminophen (TYLENOL) 500 MG tablet Take 1,000 mg by mouth every 8 (eight) hours as needed for moderate pain.     aspirin 81 MG tablet Take 81 mg by mouth at  bedtime.      bisoprolol (ZEBETA) 5 MG tablet Take 0.5 tablets (2.5 mg total) by mouth daily. 45 tablet 1   isosorbide mononitrate (IMDUR) 30 MG 24 hr tablet TAKE 1 TABLET BY MOUTH TWICE DAILY 180 tablet 3   levothyroxine (SYNTHROID) 75 MCG tablet TAKE 1 TABLET BY MOUTH EVERY DAY BEFORE BREAKFAST 90 tablet 1   lisinopril (ZESTRIL) 5 MG tablet Take 1 tablet (5 mg total) by mouth daily. 90 tablet 1   loratadine (CLARITIN) 10 MG tablet Take 10 mg by mouth daily.     nitroGLYCERIN (NITROSTAT) 0.4 MG SL tablet DISSOLVE 1 TABLET UNDER THE TONGUE EVERY 5 MINUTES AS NEEDED FOR CHEST PAIN. DO NOT EXCEED A TOTAL OF 3 DOSES IN 15  MINUTES. 25 tablet 3   PARoxetine (PAXIL) 40 MG tablet Take 1 tablet (40 mg total) by mouth daily. 90 tablet 1   rosuvastatin (CRESTOR) 20 MG tablet Take 1 tablet (20 mg total) by mouth in the morning. 90 tablet 1   No current facility-administered medications for this visit.   Allergies:  Patient has no known allergies.   ROS:  No palpitations or syncope.  Physical Exam: VS:  BP 130/80   Pulse (!) 58   Ht 5' 2.5" (1.588 m)   Wt 209 lb (94.8 kg)   SpO2 98%   BMI 37.62 kg/m , BMI Body mass index is 37.62 kg/m.  Wt Readings from Last 3 Encounters:  04/22/22 209 lb (94.8 kg)  04/06/22 205 lb (93 kg)  03/18/22 206 lb 12.7 oz (93.8 kg)    General: Patient appears comfortable at rest. HEENT: Conjunctiva and lids normal. Neck: Supple, no elevated JVP or carotid bruits, no thyromegaly. Lungs: Clear to auscultation, nonlabored breathing at rest. Cardiac: Regular rate and rhythm, no S3, 1/6 systolic murmur. Extremities: No pitting edema.  ECG:  An ECG dated 03/18/2022 was personally reviewed today and demonstrated:  Sinus rhythm with LVH and repolarization abnormalities.  Recent Labwork: 03/18/2022: BUN 21; Creatinine, Ser 1.35; Potassium 4.2; Sodium 140     Component Value Date/Time   CHOL 127 05/17/2020 1113   TRIG 157 (H) 05/17/2020 1113   HDL 37 (L) 05/17/2020 1113   CHOLHDL 3.4 05/17/2020 1113   CHOLHDL 6.0 04/20/2017 1100   VLDL 69 (H) 04/20/2017 1100   LDLCALC 63 05/17/2020 1113    Other Studies Reviewed Today:  Cardiac catheterization 01/03/2018: Post Atrio lesion is 20% stenosed. Prox RCA to Mid RCA lesion is 100% stenosed. Prox Cx to Dist Cx lesion is 100% stenosed. Ost LAD to Prox LAD lesion is 100% stenosed. LIMA graft was visualized by angiography and is large. The graft exhibits no disease. SVG graft was visualized by angiography. SVG graft was visualized by angiography. SVG graft was visualized by angiography. Origin lesion is 100% stenosed. Origin to  Prox Graft lesion is 100% stenosed. Origin to Prox Graft lesion is 100% stenosed. The left ventricular systolic function is normal. LV end diastolic pressure is normal. The left ventricular ejection fraction is 55-65% by visual estimate.   1. Severe 3 vessel occlusive CAD 2. Patent LIMA to the LAD 3. Occluded SVG to diagonal 4. Occluded SVG to the OM2 5. Occluded SVG to the PDA 6. Good LV function 7. Normal LVEDP   Plan: Compared to prior cardiac cath in March 2017 the stents in the mid to distal LCx and mid RCA are completely occluded. Given extensive work done on these vessels previously she is not a candidate for CTO  PCI.   Echocardiogram 05/19/2017: Study Conclusions   - Left ventricle: The cavity size was normal. Wall thickness was   increased in a pattern of moderate LVH. Systolic function was   normal. The estimated ejection fraction was in the range of 55%   to 60%. Wall motion was normal; there were no regional wall   motion abnormalities. Doppler parameters are consistent with   abnormal left ventricular relaxation (grade 1 diastolic   dysfunction). There was no evidence of elevated ventricular   filling pressure by Doppler parameters. - Aortic valve: Mildly calcified annulus. Trileaflet; mildly   thickened leaflets. Valve area (VTI): 2.36 cm^2. Valve area   (Vmax): 1.98 cm^2. Valve area (Vmean): 2.13 cm^2. - Atrial septum: No defect or patent foramen ovale was identified. - Technically adequate study.   Assessment and Plan:  1.  Multivessel CAD status post CABG with occluded vein grafts as well as stent sites within the mid to distal circumflex, chronic mid RCA occlusion.  LIMA to LAD is patent.  In light of limited revascularization options we continue with medical therapy.  No changes made today although up titration of Imdur would be next step.  Continue aspirin, Zebeta, Imdur, lisinopril, Crestor, and as needed nitroglycerin.  2.  Next hyperlipidemia, tolerating  Crestor.  She continues to follow with PCP for routine lab work.  Her last LDL was in the 60s.  Medication Adjustments/Labs and Tests Ordered: Current medicines are reviewed at length with the patient today.  Concerns regarding medicines are outlined above.   Tests Ordered: No orders of the defined types were placed in this encounter.   Medication Changes: No orders of the defined types were placed in this encounter.   Disposition:  Follow up  6 months.  Signed, Jonelle Sidle, MD, [facility] 04/22/2022 1:16 PM    [facility] at [facility] [address], [place], [place] [address] Phone: [phone]; Fax: [phone]

## 2022-04-22 NOTE — Patient Instructions (Signed)
Medication Instructions:  Your physician recommends that you continue on your current medications as directed. Please refer to the Current Medication list given to you today.   Labwork: None today  Testing/Procedures: None today  Follow-Up: 6 months  Any Other Special Instructions Will Be Listed Below (If Applicable).  If you need a refill on your cardiac medications before your next appointment, please call your pharmacy.  

## 2022-05-21 ENCOUNTER — Other Ambulatory Visit: Payer: Self-pay | Admitting: Cardiology

## 2022-05-22 DIAGNOSIS — R54 Age-related physical debility: Secondary | ICD-10-CM | POA: Diagnosis not present

## 2022-05-22 DIAGNOSIS — E039 Hypothyroidism, unspecified: Secondary | ICD-10-CM | POA: Diagnosis not present

## 2022-05-22 DIAGNOSIS — E6609 Other obesity due to excess calories: Secondary | ICD-10-CM | POA: Diagnosis not present

## 2022-05-22 DIAGNOSIS — I2510 Atherosclerotic heart disease of native coronary artery without angina pectoris: Secondary | ICD-10-CM | POA: Diagnosis not present

## 2022-05-22 DIAGNOSIS — I10 Essential (primary) hypertension: Secondary | ICD-10-CM | POA: Diagnosis not present

## 2022-05-22 DIAGNOSIS — E782 Mixed hyperlipidemia: Secondary | ICD-10-CM | POA: Diagnosis not present

## 2022-05-27 ENCOUNTER — Ambulatory Visit: Payer: PPO | Admitting: Urology

## 2022-05-27 ENCOUNTER — Encounter: Payer: Self-pay | Admitting: Urology

## 2022-05-27 VITALS — BP 101/64 | HR 66

## 2022-05-27 DIAGNOSIS — N3281 Overactive bladder: Secondary | ICD-10-CM

## 2022-05-27 NOTE — Patient Instructions (Signed)

## 2022-05-27 NOTE — Progress Notes (Signed)
05/27/2022 3:56 PM   Lacey Bailey 07/20/1946 [id number]  Referring provider: Darrol Jump, NP [address] [place],  [place] [address]  Followup OAB   HPI: [name] is a 76yo here for followup for OAB. She is doing well after 100 units of intravesical botox. Nocturia decreased from 3x to 0-1x. She has rare urge incontinence during the day. Urinary frequency every 3-4 hours. No straining to urinate.    PMH: Past Medical History:  Diagnosis Date   Aortic sclerosis    CAD (coronary artery disease)    a. s/p CABG in 2003 b. multiple interventions since at outside hospitals c. DES to PLB in 11/2014 d. angioplasty to mid-RCA and mid-LCx in 10/2015 with patent LIMA-LAD and occlusion of all vein grafts e. 12/2017: occlusion of vein grafts and native LCx and RCA with patent LIMA-LAD. Re-do CABG NOT recommended by CT surgery   Depression    Essential hypertension    History of kidney stones    Hyperlipidemia    Hypothyroidism    Sleep apnea    TIA (transient ischemic attack) ~ 2013    Surgical History: Past Surgical History:  Procedure Laterality Date   ABDOMINAL HYSTERECTOMY     APPENDECTOMY     BLEPHAROPLASTY Bilateral    "uppers"   BOTOX INJECTION N/A 03/23/2022   Procedure: BOTOX INJECTION- 100 units;  Surgeon: Cleon Gustin, MD;  Location: [facility];  Service: Urology;  Laterality: N/A;   CARDIAC CATHETERIZATION  11/22/2015   CARDIAC CATHETERIZATION N/A 11/22/2015   Procedure: Left Heart Cath and Cors/Grafts Angiography;  Surgeon: Leonie Man, MD;  Location: [place] CV LAB;  Service: Cardiovascular;  Laterality: N/A;   CARDIAC CATHETERIZATION N/A 11/22/2015   Procedure: Coronary Stent Intervention;  Surgeon: Leonie Man, MD;  Location: [place] CV LAB;  Service: Cardiovascular;  Laterality: N/A;   CATARACT EXTRACTION, BILATERAL Bilateral    CESAREAN SECTION  1977   CORONARY ANGIOPLASTY WITH STENT PLACEMENT  "several"   "last one was 11/2014:  DES  to the posterolateral branch /notes 11/20/2015   CORONARY ARTERY BYPASS GRAFT  2003   [place]; CABG X 4   CYSTOSCOPY WITH INJECTION N/A 03/23/2022   Procedure: CYSTOSCOPY WITH INJECTION;  Surgeon: Cleon Gustin, MD;  Location: [facility];  Service: Urology;  Laterality: N/A;   DILATION AND CURETTAGE OF UTERUS     KIDNEY STONE SURGERY     "opened me up"   LAPAROSCOPIC CHOLECYSTECTOMY     LEFT HEART CATH AND CORS/GRAFTS ANGIOGRAPHY N/A 01/03/2018   Procedure: LEFT HEART CATH AND CORS/GRAFTS ANGIOGRAPHY;  Surgeon: Martinique, Peter M, MD;  Location: [place] CV LAB;  Service: Cardiovascular;  Laterality: N/A;   TONSILLECTOMY     TUBAL LIGATION      Home Medications:  Allergies as of 05/27/2022   No Known Allergies      Medication List        Accurate as of May 27, 2022  3:56 PM. If you have any questions, ask your nurse or doctor.          acetaminophen 500 MG tablet Commonly known as: TYLENOL Take 1,000 mg by mouth every 8 (eight) hours as needed for moderate pain.   aspirin 81 MG tablet Take 81 mg by mouth at bedtime.   bisoprolol 5 MG tablet Commonly known as: ZEBETA Take 0.5 tablets (2.5 mg total) by mouth daily.   isosorbide mononitrate 30 MG 24 hr tablet Commonly known as: IMDUR TAKE  1 TABLET BY MOUTH TWICE DAILY   levothyroxine 75 MCG tablet Commonly known as: SYNTHROID TAKE 1 TABLET BY MOUTH EVERY DAY BEFORE BREAKFAST   lisinopril 5 MG tablet Commonly known as: ZESTRIL Take 1 tablet (5 mg total) by mouth daily.   loratadine 10 MG tablet Commonly known as: CLARITIN Take 10 mg by mouth daily.   nitroGLYCERIN 0.4 MG SL tablet Commonly known as: NITROSTAT DISSOLVE 1 TABLET UNDER THE TONGUE EVERY 5 MINUTES AS NEEDED FOR CHEST PAIN. DO NOT EXCEED A TOTAL OF 3 DOSES IN 15 MINUTES.   PARoxetine 40 MG tablet Commonly known as: PAXIL Take 1 tablet (40 mg total) by mouth daily.   rosuvastatin 20 MG tablet Commonly known as: CRESTOR Take 1 tablet (20 mg  total) by mouth in the morning.        Allergies: No Known Allergies  Family History: Family History  Problem Relation Age of Onset   Heart attack Mother    Heart disease Mother    Heart disease Brother     Social History:  reports that she has never smoked. She has never used smokeless tobacco. She reports that she does not currently use alcohol. She reports that she does not use drugs.  ROS: All other review of systems were reviewed and are negative except what is noted above in HPI  Physical Exam: BP 101/64   Pulse 66   Constitutional:  Alert and oriented, No acute distress. HEENT: [place] AT, moist mucus membranes.  Trachea midline, no masses. Cardiovascular: No clubbing, cyanosis, or edema. Respiratory: Normal respiratory effort, no increased work of breathing. GI: Abdomen is soft, nontender, nondistended, no abdominal masses GU: No CVA tenderness.  Lymph: No cervical or inguinal lymphadenopathy. Skin: No rashes, bruises or suspicious lesions. Neurologic: Grossly intact, no focal deficits, moving all 4 extremities. Psychiatric: Normal mood and affect.  Laboratory Data: Lab Results  Component Value Date   WBC 6.7 05/17/2020   HGB 13.9 05/17/2020   HCT 41.3 05/17/2020   MCV 91 05/17/2020   PLT 321 05/17/2020    Lab Results  Component Value Date   CREATININE 1.35 (H) 03/18/2022    No results found for: "PSA"  No results found for: "TESTOSTERONE"  No results found for: "HGBA1C"  Urinalysis    Component Value Date/Time   APPEARANCEUR Cloudy (A) 10/21/2021 1059   GLUCOSEU Negative 10/21/2021 1059   BILIRUBINUR Negative 10/21/2021 1059   PROTEINUR Negative 10/21/2021 1059   NITRITE Positive (A) 10/21/2021 1059   LEUKOCYTESUR 3+ (A) 10/21/2021 1059    Lab Results  Component Value Date   LABMICR Comment 10/21/2021    Pertinent Imaging:  No results found for this or any previous visit.  No results found for this or any previous visit.  No results  found for this or any previous visit.  No results found for this or any previous visit.  No results found for this or any previous visit.  No valid procedures specified. No results found for this or any previous visit.  No results found for this or any previous visit.   Assessment & Plan:    1. OAB (overactive bladder) -RTC 4 months  - Urinalysis, Routine w reflex microscopic   No follow-ups on file.  Wilkie Aye, MD  [facility] [place]

## 2022-05-28 LAB — URINALYSIS, ROUTINE W REFLEX MICROSCOPIC
Bilirubin, UA: NEGATIVE
Glucose, UA: NEGATIVE
Nitrite, UA: POSITIVE — AB
RBC, UA: NEGATIVE
Specific Gravity, UA: 1.02 (ref 1.005–1.030)
Urobilinogen, Ur: 0.2 mg/dL (ref 0.2–1.0)
pH, UA: 5 (ref 5.0–7.5)

## 2022-05-28 LAB — MICROSCOPIC EXAMINATION

## 2022-06-22 DIAGNOSIS — I10 Essential (primary) hypertension: Secondary | ICD-10-CM | POA: Diagnosis not present

## 2022-06-22 DIAGNOSIS — R54 Age-related physical debility: Secondary | ICD-10-CM | POA: Diagnosis not present

## 2022-06-22 DIAGNOSIS — E6609 Other obesity due to excess calories: Secondary | ICD-10-CM | POA: Diagnosis not present

## 2022-06-22 DIAGNOSIS — E782 Mixed hyperlipidemia: Secondary | ICD-10-CM | POA: Diagnosis not present

## 2022-06-22 DIAGNOSIS — E039 Hypothyroidism, unspecified: Secondary | ICD-10-CM | POA: Diagnosis not present

## 2022-06-22 DIAGNOSIS — I2510 Atherosclerotic heart disease of native coronary artery without angina pectoris: Secondary | ICD-10-CM | POA: Diagnosis not present

## 2022-07-06 DIAGNOSIS — J40 Bronchitis, not specified as acute or chronic: Secondary | ICD-10-CM | POA: Diagnosis not present

## 2022-07-21 DIAGNOSIS — R54 Age-related physical debility: Secondary | ICD-10-CM | POA: Diagnosis not present

## 2022-07-21 DIAGNOSIS — I10 Essential (primary) hypertension: Secondary | ICD-10-CM | POA: Diagnosis not present

## 2022-07-21 DIAGNOSIS — E782 Mixed hyperlipidemia: Secondary | ICD-10-CM | POA: Diagnosis not present

## 2022-07-21 DIAGNOSIS — E6609 Other obesity due to excess calories: Secondary | ICD-10-CM | POA: Diagnosis not present

## 2022-07-21 DIAGNOSIS — E039 Hypothyroidism, unspecified: Secondary | ICD-10-CM | POA: Diagnosis not present

## 2022-07-21 DIAGNOSIS — I2510 Atherosclerotic heart disease of native coronary artery without angina pectoris: Secondary | ICD-10-CM | POA: Diagnosis not present

## 2022-07-23 ENCOUNTER — Other Ambulatory Visit (HOSPITAL_COMMUNITY): Payer: Self-pay | Admitting: Nurse Practitioner

## 2022-07-23 DIAGNOSIS — Z1231 Encounter for screening mammogram for malignant neoplasm of breast: Secondary | ICD-10-CM

## 2022-08-05 DIAGNOSIS — G4733 Obstructive sleep apnea (adult) (pediatric): Secondary | ICD-10-CM | POA: Diagnosis not present

## 2022-08-10 ENCOUNTER — Ambulatory Visit (HOSPITAL_COMMUNITY): Payer: PPO

## 2022-08-12 DIAGNOSIS — E039 Hypothyroidism, unspecified: Secondary | ICD-10-CM | POA: Diagnosis not present

## 2022-08-12 DIAGNOSIS — R54 Age-related physical debility: Secondary | ICD-10-CM | POA: Diagnosis not present

## 2022-08-12 DIAGNOSIS — E6609 Other obesity due to excess calories: Secondary | ICD-10-CM | POA: Diagnosis not present

## 2022-08-12 DIAGNOSIS — E782 Mixed hyperlipidemia: Secondary | ICD-10-CM | POA: Diagnosis not present

## 2022-08-12 DIAGNOSIS — I10 Essential (primary) hypertension: Secondary | ICD-10-CM | POA: Diagnosis not present

## 2022-08-12 DIAGNOSIS — I2510 Atherosclerotic heart disease of native coronary artery without angina pectoris: Secondary | ICD-10-CM | POA: Diagnosis not present

## 2022-09-14 ENCOUNTER — Ambulatory Visit (HOSPITAL_COMMUNITY)
Admission: RE | Admit: 2022-09-14 | Discharge: 2022-09-14 | Disposition: A | Discharge status: Home / Self Care | Payer: PPO | Source: Ambulatory Visit | Attending: Nurse Practitioner | Admitting: Nurse Practitioner

## 2022-09-14 DIAGNOSIS — Z1231 Encounter for screening mammogram for malignant neoplasm of breast: Secondary | ICD-10-CM | POA: Diagnosis not present

## 2022-09-16 DIAGNOSIS — E039 Hypothyroidism, unspecified: Secondary | ICD-10-CM | POA: Diagnosis not present

## 2022-09-16 DIAGNOSIS — R54 Age-related physical debility: Secondary | ICD-10-CM | POA: Diagnosis not present

## 2022-09-16 DIAGNOSIS — I2510 Atherosclerotic heart disease of native coronary artery without angina pectoris: Secondary | ICD-10-CM | POA: Diagnosis not present

## 2022-09-16 DIAGNOSIS — E6609 Other obesity due to excess calories: Secondary | ICD-10-CM | POA: Diagnosis not present

## 2022-09-16 DIAGNOSIS — I10 Essential (primary) hypertension: Secondary | ICD-10-CM | POA: Diagnosis not present

## 2022-09-16 DIAGNOSIS — E782 Mixed hyperlipidemia: Secondary | ICD-10-CM | POA: Diagnosis not present

## 2022-09-30 ENCOUNTER — Ambulatory Visit: Payer: PPO | Admitting: Urology

## 2022-10-06 ENCOUNTER — Ambulatory Visit (INDEPENDENT_AMBULATORY_CARE_PROVIDER_SITE_OTHER): Payer: PPO | Admitting: Urology

## 2022-10-06 ENCOUNTER — Encounter: Payer: Self-pay | Admitting: Urology

## 2022-10-06 VITALS — BP 111/77 | HR 70

## 2022-10-06 DIAGNOSIS — N3281 Overactive bladder: Secondary | ICD-10-CM

## 2022-10-06 LAB — URINALYSIS, ROUTINE W REFLEX MICROSCOPIC
Bilirubin, UA: NEGATIVE
Glucose, UA: NEGATIVE
Nitrite, UA: POSITIVE — AB
RBC, UA: NEGATIVE
Specific Gravity, UA: 1.02 (ref 1.005–1.030)
Urobilinogen, Ur: 1 mg/dL (ref 0.2–1.0)
pH, UA: 5 (ref 5.0–7.5)

## 2022-10-06 LAB — MICROSCOPIC EXAMINATION: Epithelial Cells (non renal): >10 /hpf — AB (ref 0–10)

## 2022-10-06 MED ORDER — DIAZEPAM 10 MG PO TABS: 10.0000 mg | ORAL_TABLET | Freq: Once | ORAL | 0 refills | Status: AC

## 2022-10-06 NOTE — Progress Notes (Signed)
10/06/2022 2:49 PM   Lacey Bailey 12/07/1945 [id number]  Referring provider: Darrol Jump, NP [address] [place],  [place] [address]  Followup OAB   HPI: [name] is a 77yo here for followup for OAB. She noted her urgency and urge incontinence worsened in the past 1-2 weeks. Nocturia 1-2x.  She had 100 units of intravesical botox in 03/23/2022.    PMH: Past Medical History:  Diagnosis Date   Aortic sclerosis    CAD (coronary artery disease)    a. s/p CABG in 2003 b. multiple interventions since at outside hospitals c. DES to PLB in 11/2014 d. angioplasty to mid-RCA and mid-LCx in 10/2015 with patent LIMA-LAD and occlusion of all vein grafts e. 12/2017: occlusion of vein grafts and native LCx and RCA with patent LIMA-LAD. Re-do CABG NOT recommended by CT surgery   Depression    Essential hypertension    History of kidney stones    Hyperlipidemia    Hypothyroidism    Sleep apnea    TIA (transient ischemic attack) ~ 2013    Surgical History: Past Surgical History:  Procedure Laterality Date   ABDOMINAL HYSTERECTOMY     APPENDECTOMY     BLEPHAROPLASTY Bilateral    "uppers"   BOTOX INJECTION N/A 03/23/2022   Procedure: BOTOX INJECTION- 100 units;  Surgeon: Cleon Gustin, MD;  Location: [facility];  Service: Urology;  Laterality: N/A;   CARDIAC CATHETERIZATION  11/22/2015   CARDIAC CATHETERIZATION N/A 11/22/2015   Procedure: Left Heart Cath and Cors/Grafts Angiography;  Surgeon: Leonie Man, MD;  Location: [facility];  Service: Cardiovascular;  Laterality: N/A;   CARDIAC CATHETERIZATION N/A 11/22/2015   Procedure: Coronary Stent Intervention;  Surgeon: Leonie Man, MD;  Location: [facility];  Service: Cardiovascular;  Laterality: N/A;   CATARACT EXTRACTION, BILATERAL Bilateral    CESAREAN SECTION  1977   CORONARY ANGIOPLASTY WITH STENT PLACEMENT  "several"   "last one was 11/2014:  DES to the posterolateral branch /notes 11/20/2015    CORONARY ARTERY BYPASS GRAFT  2003   [facility]; CABG X 4   CYSTOSCOPY WITH INJECTION N/A 03/23/2022   Procedure: CYSTOSCOPY WITH INJECTION;  Surgeon: Cleon Gustin, MD;  Location: [facility];  Service: Urology;  Laterality: N/A;   DILATION AND CURETTAGE OF UTERUS     KIDNEY STONE SURGERY     "opened me up"   LAPAROSCOPIC CHOLECYSTECTOMY     LEFT HEART CATH AND CORS/GRAFTS ANGIOGRAPHY N/A 01/03/2018   Procedure: LEFT HEART CATH AND CORS/GRAFTS ANGIOGRAPHY;  Surgeon: Martinique, Peter M, MD;  Location: [facility];  Service: Cardiovascular;  Laterality: N/A;   TONSILLECTOMY     TUBAL LIGATION      Home Medications:  Allergies as of 10/06/2022   No Known Allergies      Medication List        Accurate as of October 06, 2022  2:49 PM. If you have any questions, ask your nurse or doctor.          acetaminophen 500 MG tablet Commonly known as: TYLENOL Take 1,000 mg by mouth every 8 (eight) hours as needed for moderate pain.   aspirin 81 MG tablet Take 81 mg by mouth at bedtime.   bisoprolol 5 MG tablet Commonly known as: ZEBETA Take 0.5 tablets (2.5 mg total) by mouth daily.   isosorbide mononitrate 30 MG 24 hr tablet Commonly known as: IMDUR TAKE 1 TABLET BY MOUTH TWICE DAILY  levothyroxine 75 MCG tablet Commonly known as: SYNTHROID TAKE 1 TABLET BY MOUTH EVERY DAY BEFORE BREAKFAST   lisinopril 5 MG tablet Commonly known as: ZESTRIL Take 1 tablet (5 mg total) by mouth daily.   loratadine 10 MG tablet Commonly known as: CLARITIN Take 10 mg by mouth daily.   nitroGLYCERIN 0.4 MG SL tablet Commonly known as: NITROSTAT DISSOLVE 1 TABLET UNDER THE TONGUE EVERY 5 MINUTES AS NEEDED FOR CHEST PAIN. DO NOT EXCEED A TOTAL OF 3 DOSES IN 15 MINUTES.   PARoxetine 40 MG tablet Commonly known as: PAXIL Take 1 tablet (40 mg total) by mouth daily.   rosuvastatin 20 MG tablet Commonly known as: CRESTOR Take 1 tablet (20 mg total) by mouth in the morning.         Allergies: No Known Allergies  Family History: Family History  Problem Relation Age of Onset   Heart attack Mother    Heart disease Mother    Heart disease Brother     Social History:  reports that she has never smoked. She has never used smokeless tobacco. She reports that she does not currently use alcohol. She reports that she does not use drugs.  ROS: All other review of systems were reviewed and are negative except what is noted above in HPI  Physical Exam: BP 111/77   Pulse 70   Constitutional:  Alert and oriented, No acute distress. HEENT: [place] AT, moist mucus membranes.  Trachea midline, no masses. Cardiovascular: No clubbing, cyanosis, or edema. Respiratory: Normal respiratory effort, no increased work of breathing. GI: Abdomen is soft, nontender, nondistended, no abdominal masses GU: No CVA tenderness.  Lymph: No cervical or inguinal lymphadenopathy. Skin: No rashes, bruises or suspicious lesions. Neurologic: Grossly intact, no focal deficits, moving all 4 extremities. Psychiatric: Normal mood and affect.  Laboratory Data: Lab Results  Component Value Date   WBC 6.7 05/17/2020   HGB 13.9 05/17/2020   HCT 41.3 05/17/2020   MCV 91 05/17/2020   PLT 321 05/17/2020    Lab Results  Component Value Date   CREATININE 1.35 (H) 03/18/2022    No results found for: "PSA"  No results found for: "TESTOSTERONE"  No results found for: "HGBA1C"  Urinalysis    Component Value Date/Time   APPEARANCEUR Hazy (A) 05/27/2022 1548   GLUCOSEU Negative 05/27/2022 1548   BILIRUBINUR Negative 05/27/2022 1548   PROTEINUR Trace (A) 05/27/2022 1548   NITRITE Positive (A) 05/27/2022 1548   LEUKOCYTESUR 1+ (A) 05/27/2022 1548    Lab Results  Component Value Date   LABMICR See below: 05/27/2022   WBCUA 6-10 (A) 05/27/2022   LABEPIT 0-10 05/27/2022   BACTERIA Many (A) 05/27/2022    Pertinent Imaging:  No results found for this or any previous visit.  No results  found for this or any previous visit.  No results found for this or any previous visit.  No results found for this or any previous visit.  No results found for this or any previous visit.  No valid procedures specified. No results found for this or any previous visit.  No results found for this or any previous visit.   Assessment & Plan:    1. OAB (overactive bladder) -We will schedule for intravesical botox.  - Urinalysis, Routine w reflex microscopic   No follow-ups on file.  Nicolette Bang, MD  [facility] [place]

## 2022-10-06 NOTE — Patient Instructions (Signed)

## 2022-10-20 DIAGNOSIS — Z1331 Encounter for screening for depression: Secondary | ICD-10-CM | POA: Diagnosis not present

## 2022-10-20 DIAGNOSIS — I10 Essential (primary) hypertension: Secondary | ICD-10-CM | POA: Diagnosis not present

## 2022-10-20 DIAGNOSIS — E782 Mixed hyperlipidemia: Secondary | ICD-10-CM | POA: Diagnosis not present

## 2022-10-20 DIAGNOSIS — Z7189 Other specified counseling: Secondary | ICD-10-CM | POA: Diagnosis not present

## 2022-10-20 DIAGNOSIS — I2510 Atherosclerotic heart disease of native coronary artery without angina pectoris: Secondary | ICD-10-CM | POA: Diagnosis not present

## 2022-10-20 DIAGNOSIS — E039 Hypothyroidism, unspecified: Secondary | ICD-10-CM | POA: Diagnosis not present

## 2022-10-20 DIAGNOSIS — Z0001 Encounter for general adult medical examination with abnormal findings: Secondary | ICD-10-CM | POA: Diagnosis not present

## 2022-10-22 ENCOUNTER — Telehealth: Payer: Self-pay

## 2022-10-22 ENCOUNTER — Ambulatory Visit (INDEPENDENT_AMBULATORY_CARE_PROVIDER_SITE_OTHER): Payer: PPO | Admitting: Urology

## 2022-10-22 DIAGNOSIS — Z01818 Encounter for other preprocedural examination: Secondary | ICD-10-CM

## 2022-10-22 DIAGNOSIS — N3281 Overactive bladder: Secondary | ICD-10-CM

## 2022-10-22 DIAGNOSIS — E039 Hypothyroidism, unspecified: Secondary | ICD-10-CM | POA: Diagnosis not present

## 2022-10-22 DIAGNOSIS — E782 Mixed hyperlipidemia: Secondary | ICD-10-CM | POA: Diagnosis not present

## 2022-10-22 DIAGNOSIS — R54 Age-related physical debility: Secondary | ICD-10-CM | POA: Diagnosis not present

## 2022-10-22 DIAGNOSIS — I2510 Atherosclerotic heart disease of native coronary artery without angina pectoris: Secondary | ICD-10-CM | POA: Diagnosis not present

## 2022-10-22 DIAGNOSIS — I10 Essential (primary) hypertension: Secondary | ICD-10-CM | POA: Diagnosis not present

## 2022-10-22 DIAGNOSIS — E6609 Other obesity due to excess calories: Secondary | ICD-10-CM | POA: Diagnosis not present

## 2022-10-22 NOTE — Progress Notes (Addendum)
Patient drop off urine for pre-botox .Review with Dr. Jeffie Pollock. No treatment started, pending urine culture.

## 2022-10-22 NOTE — Telephone Encounter (Signed)
Patient scheduled for botox 10/28/2022 in office.  I called patient and NV scheduled for ua/uc drop off pre botox. Patient voiced understanding and will come this pm for specimen.

## 2022-10-23 ENCOUNTER — Other Ambulatory Visit: Payer: Self-pay

## 2022-10-23 DIAGNOSIS — R399 Unspecified symptoms and signs involving the genitourinary system: Secondary | ICD-10-CM

## 2022-10-23 LAB — MICROSCOPIC EXAMINATION

## 2022-10-23 LAB — URINALYSIS, ROUTINE W REFLEX MICROSCOPIC
Bilirubin, UA: NEGATIVE
Glucose, UA: NEGATIVE
Nitrite, UA: POSITIVE — AB
Protein,UA: NEGATIVE
RBC, UA: NEGATIVE
Specific Gravity, UA: 1.02 (ref 1.005–1.030)
Urobilinogen, Ur: 1 mg/dL (ref 0.2–1.0)
pH, UA: 5.5 (ref 5.0–7.5)

## 2022-10-23 MED ORDER — NITROFURANTOIN MONOHYD MACRO 100 MG PO CAPS: 100.0000 mg | ORAL_CAPSULE | Freq: Two times a day (BID) | ORAL | 0 refills | Status: DC

## 2022-10-23 NOTE — Progress Notes (Signed)
Dr. Alyson Ingles reviewed ua results and gave verbal to send in Macrobid 100 mg BID x7.  Rx sent to pharmacy.  Urine sent for culture, left voicemail informing pt of rx.

## 2022-10-27 LAB — URINE CULTURE

## 2022-10-28 ENCOUNTER — Ambulatory Visit (INDEPENDENT_AMBULATORY_CARE_PROVIDER_SITE_OTHER): Payer: PPO | Admitting: Urology

## 2022-10-28 VITALS — BP 111/77 | HR 64

## 2022-10-28 DIAGNOSIS — N3281 Overactive bladder: Secondary | ICD-10-CM | POA: Diagnosis not present

## 2022-10-28 MED ORDER — ONABOTULINUMTOXINA 100 UNITS IJ SOLR
100.0000 [IU] | Freq: Once | INTRAMUSCULAR | Status: AC
  Administered 2022-10-28: 100 [IU] via INTRAMUSCULAR

## 2022-10-28 MED ORDER — LIDOCAINE HCL 1 % IJ SOLN
50.0000 mL | Freq: Once | INTRAMUSCULAR | Status: AC
  Administered 2022-10-28: 50 mL

## 2022-10-29 ENCOUNTER — Encounter: Payer: Self-pay | Admitting: Urology

## 2022-10-29 NOTE — Progress Notes (Signed)
Intraservice. Urojet was instilled using sterile technique into the urethra without difficulty.  Anesthesia: 1% plain lidocaine 50 cc instilled via careful sterile placement of a foley catheter. The bladder was allowed to achieve anesthesia via gently rolling the patient side to side for 15 minutes. The catheter was removed after the instillation. TIME ELEMENT = 15 MINUTES  Prep: BOTOX(R) (Allergan, [place], [place], 100 Units per vial) was reconstituted gently with 10 mL of 0.9% sterile, nonpreserved saline solution and drawn into a 10-mL syringe. An additional 1 mL of sterile, nonpreserved saline solution was drawn into a separate syringe for the final injection so that the remaining BOTOX(R) in the needle was delivered to the bladder. TIME ELEMENT = 15 MINUTES  Procedure: A rigid cystoscope was gently inserted into the urinary bladder via the urethra. The trigone was identified and evaluated. Next we identified 20 template sites within the urinary bladder. The bladder is only instilled with 100 cc volume to ensure adequate muscle wall thickness to prevent needle penetration beyond the muscle wall. Careful injections were undertaken for a total of 20 injections using a Laborie depth-limiting needle ([employer] [place], [place]). 0.5 cc volume was delivered at each site carefully into the muscle without excessive bleb formation submucosally. Targeted zones were injected over TIME ELEMENT =15 MINUTES.  Postservice: The patient was monitored in recovery for 30 minutes. Patient voided successfully. Clean intermittent catheterization training was reviewed again for the patient. Respiratory and cardiac status were confirmed for stability. Mentation is assessed to be adequate for discharge with alteration in status. TIME ELEMENT = 30 MINUTES. TOTAL PROCEDURE TIME: 1 HR AND 10 MINUTES  Qty: 100 Unit: kit Route: Intra-vesically Freq: None Site: None Adm by: Nicolette Bang Mfgr:  Allergan  Patient was instructed to call the office immediately for bloody urine, difficulty urinating, urinary retention, painful or frequent urination, fever, chills, nausea, vomiting, or other illness. The patient stated that she understood these instructions and would comply with them. Patient was provided prophylactic antibiotics (except aminoglycosides) for 1 to 3 days postprocedure.

## 2022-10-29 NOTE — Patient Instructions (Signed)
Botulinum Toxin Bladder Injection  A botulinum toxin bladder injection is a procedure to treat an overactive bladder. During the procedure, a drug called botulinum toxin is injected into the bladder through a long, thin needle. This drug relaxes the bladder muscles and reduces overactivity. You may need this procedure if your medicines are not working or you cannot take them. The procedure may be repeated as needed. The treatment is done once and it usually lasts for 6 months. Your health care provider will monitor you to see how well you respond. Tell a health care provider about: Any allergies you have. All medicines you are taking, including vitamins, herbs, eye drops, creams, and over-the-counter medicines. Any problems you or family members have had with anesthetic medicines. Any bleeding problems you have. Any surgeries you have had. Any medical conditions you have. Any previous reactions to a botulinum toxin injection. Any symptoms of urinary tract infection. These include chills, fever, a burning feeling when passing urine, and needing to pass urine often. Whether you are pregnant or may be pregnant. What are the risks? Generally this is a safe procedure. However, problems may occur, including: Not being able to pass urine. If this happens, you may need to have your bladder emptied with a thin tube (urinary catheter). Bleeding. Urinary tract infection. Allergic reaction to the botulinum toxin. Pain or burning when passing urine. Damage to nearby structures or organs. What happens before the procedure? When to stop eating and drinking Follow instructions from your health care provider about what you may eat and drink before your procedure. These may include: 8 hours before the procedure Stop eating most foods. Do not eat meat, fried foods, or fatty foods. Eat only light foods, such as toast or crackers. All liquids are okay except energy drinks and alcohol. 6 hours before the  procedure Stop eating. Drink only clear liquids, such as water, clear fruit juice, black coffee, plain tea, and sports drinks. Do not drink energy drinks or alcohol. 2 hours before the procedure Stop drinking all liquids. You may be allowed to take medicines with small sips of water. If you do not follow your health care provider's instructions, your procedure may be delayed or canceled. Medicines Ask your health care provider about: Changing or stopping your regular medicines. This is especially important if you are taking diabetes medicines or blood thinners. Taking medicines such as aspirin and ibuprofen. These medicines can thin your blood. Do not take these medicines unless your health care provider tells you to take them. Taking over-the-counter medicines, vitamins, herbs, and supplements. General instructions Ask your health care provider what steps will be taken to help prevent infection. These steps may include: Removing hair at the procedure site. Washing skin with a germ-killing soap. Taking antibiotic medicine. If you will be going home right after the procedure, plan to have a responsible adult: Take you home from the hospital or clinic. You will not be allowed to drive. Care for you for the time you are told. What happens during the procedure?  You will be asked to empty your bladder. An IV will be inserted into one of your veins. You will be given one or more of the following: A medicine to help you relax (sedative). A medicine to numb the area (local anesthetic). A medicine to make you fall asleep (general anesthetic). A long, thin scope called a cystoscope will be passed into your bladder through the part of the body that carries urine from your bladder (urethra). The cystoscope  will be used to fill your bladder with water. A long needle will be passed through the cystoscope and into the bladder. The botulinum toxin will be injected into your bladder. It may be  injected into multiple areas of your bladder. The cystoscope will be removed and your bladder will be emptied with a urinary catheter. The procedure may vary among health care providers and hospitals. What can I expect after the procedure? After your procedure, it is common to have: Blood-tinged urine. Burning or soreness when you pass urine. Follow these instructions at home: Medicines Take over-the-counter and prescription medicines only as told by your health care provider. If you were prescribed an antibiotic medicine, take it as told by your health care provider. Do not stop using the antibiotic even if you start to feel better. General instructions  If you were given a sedative during the procedure, it can affect you for several hours. Do not drive or operate machinery until your health care provider says that it is safe. Drink enough fluid to keep your urine pale yellow. Return to your normal activities as told by your health care provider. Ask your health care provider what activities are safe for you. Keep all follow-up visits. Contact a health care provider if you have: A fever or chills. Blood-tinged urine for more than one day after your procedure. Worsening pain or burning when you pass urine. Pain or burning when passing urine for more than two days after your procedure. Trouble emptying your bladder. Get help right away if you: Have bright red blood in your urine. Are unable to pass urine. Summary A botulinum toxin bladder injection is a procedure to treat an overactive bladder. This is generally a safe procedure. However, problems may occur, including not being able to pass urine, bleeding, infection, pain, and an allergic reaction to the botulinum toxin. You will be told when to stop eating and drinking, and what medicines to change or stop. Follow instructions carefully. After the procedure, it is common to have blood in your urine and to have soreness or burning when  passing urine. Contact a health care provider if you have a fever, blood in your urine for more than a few days, or trouble passing urine. Get help right away if you have bright red blood in your urine, or if you are unable to pass urine. This information is not intended to replace advice given to you by your health care provider. Make sure you discuss any questions you have with your health care provider. Document Revised: 02/14/2021 Document Reviewed: 02/14/2021 Elsevier Patient Education  [place].

## 2022-11-01 NOTE — Progress Notes (Unsigned)
    Cardiology Office Note  Date: 11/02/2022   ID: Melana, Hingle 08/21/1946, MRN [id number]  History of Present Illness: Lacey Bailey is a 77 y.o. female last seen in August 2023.  She is here with her husband for a follow-up visit.  No progressive angina or nitroglycerin use on baseline medical therapy.  Still has good days and bad days.  She has had no sudden palpitations or syncope, no orthopnea or PND.  I went over her current regimen which is stable from a cardiac perspective.  She has been tolerating Crestor well, most recent LDL was 65.  Blood pressure and heart rate are well-controlled.  We discussed continuing with current plan.  Physical Exam: VS:  BP 122/72 (BP Location: Left Arm, Patient Position: Sitting, Cuff Size: Normal)   Pulse 67   Ht 5' 2.5" (1.588 m)   Wt 215 lb (97.5 kg)   SpO2 97%   BMI 38.70 kg/m , BMI Body mass index is 38.7 kg/m.  Wt Readings from Last 3 Encounters:  11/02/22 215 lb (97.5 kg)  04/22/22 209 lb (94.8 kg)  04/06/22 205 lb (93 kg)    General: Patient appears comfortable at rest. HEENT: Conjunctiva and lids normal. Neck: Supple, no elevated JVP or carotid bruits. Lungs: Clear to auscultation, nonlabored breathing at rest. Cardiac: Regular rate and rhythm, no S3, 1/6 systolic murmu.  ECG:  An ECG dated 03/18/2022 was personally reviewed today and demonstrated:  Sinus rhythm with LVH and repolarization abnormalities.  Labwork: 03/18/2022: BUN 21; Creatinine, Ser 1.35; Potassium 4.2; Sodium 140  February 2024: Hemoglobin 13.1, platelets 294, hemoglobin A1c 5.7%, TSH 3.95, cholesterol 130, triglycerides 165, HDL 37, LDL 65  Other Studies Reviewed Today:  No interval cardiac testing for review today.  Assessment and Plan:  1.  Multivessel CAD status post CABG with occluded vein grafts as well as stent sites within the mid to distal circumflex, chronic mid RCA occlusion.  LIMA to LAD is patent.  Given limited revascularization options,  medical therapy has been pursued.  She has had relatively stable symptoms as discussed above.  Plan to continue current regimen including aspirin, bisoprolol, Imdur, lisinopril, Crestor, and as needed nitroglycerin.  2.  Mixed hyperlipidemia.  Recent LDL 65.  Continue current dose of Crestor.  Disposition:  Follow up  6 months.  Signed, Satira Sark, M.D., F.A.C.C.

## 2022-11-02 ENCOUNTER — Encounter: Payer: Self-pay | Admitting: Cardiology

## 2022-11-02 ENCOUNTER — Ambulatory Visit: Payer: PPO | Attending: Cardiology | Admitting: Cardiology

## 2022-11-02 VITALS — BP 122/72 | HR 67 | Ht 62.5 in | Wt 215.0 lb

## 2022-11-02 DIAGNOSIS — I25119 Atherosclerotic heart disease of native coronary artery with unspecified angina pectoris: Secondary | ICD-10-CM

## 2022-11-02 DIAGNOSIS — E782 Mixed hyperlipidemia: Secondary | ICD-10-CM

## 2022-11-02 NOTE — Patient Instructions (Signed)
Medication Instructions:  Your physician recommends that you continue on your current medications as directed. Please refer to the Current Medication list given to you today.   Labwork: None  Testing/Procedures: None  Follow-Up: Follow up with Dr. McDowell in 6 months.   Any Other Special Instructions Will Be Listed Below (If Applicable).     If you need a refill on your cardiac medications before your next appointment, please call your pharmacy.  

## 2022-11-24 DIAGNOSIS — H02883 Meibomian gland dysfunction of right eye, unspecified eyelid: Secondary | ICD-10-CM | POA: Diagnosis not present

## 2022-11-24 DIAGNOSIS — Z961 Presence of intraocular lens: Secondary | ICD-10-CM | POA: Diagnosis not present

## 2022-11-24 DIAGNOSIS — M3501 Sicca syndrome with keratoconjunctivitis: Secondary | ICD-10-CM | POA: Diagnosis not present

## 2022-12-15 ENCOUNTER — Ambulatory Visit: Payer: PPO | Admitting: Urology

## 2022-12-18 ENCOUNTER — Ambulatory Visit (INDEPENDENT_AMBULATORY_CARE_PROVIDER_SITE_OTHER): Payer: PPO | Admitting: Urology

## 2022-12-18 ENCOUNTER — Encounter: Payer: Self-pay | Admitting: Urology

## 2022-12-18 VITALS — BP 111/79 | HR 65

## 2022-12-18 DIAGNOSIS — N3281 Overactive bladder: Secondary | ICD-10-CM | POA: Diagnosis not present

## 2022-12-18 NOTE — Progress Notes (Unsigned)
Pt here today for bladder scan. Bladder was scanned and 0 was visualized.

## 2022-12-18 NOTE — Progress Notes (Unsigned)
12/18/2022 12:32 PM   Lacey Bailey Jan 15, 1946 [id number]  Referring provider: Renaldo Harrison, NP [address] [place],  [place] [address]  Followup OAB   HPI: Ms Kluck is a 76yo here for followup for OAB. She is 1 month post intravesical botox injections and is doing well. No urinary urgency. Nocturia resolved    PMH: Past Medical History:  Diagnosis Date   Aortic sclerosis    CAD (coronary artery disease)    a. s/p CABG in 2003 b. multiple interventions since at outside hospitals c. DES to PLB in 11/2014 d. angioplasty to mid-RCA and mid-LCx in 10/2015 with patent LIMA-LAD and occlusion of all vein grafts e. 12/2017: occlusion of vein grafts and native LCx and RCA with patent LIMA-LAD. Re-do CABG NOT recommended by CT surgery   Depression    Essential hypertension    History of kidney stones    Hyperlipidemia    Hypothyroidism    Sleep apnea    TIA (transient ischemic attack) ~ 2013    Surgical History: Past Surgical History:  Procedure Laterality Date   ABDOMINAL HYSTERECTOMY     APPENDECTOMY     BLEPHAROPLASTY Bilateral    "uppers"   BOTOX INJECTION N/A 03/23/2022   Procedure: BOTOX INJECTION- 100 units;  Surgeon: Malen Gauze, MD;  Location: [facility];  Service: Urology;  Laterality: N/A;   CARDIAC CATHETERIZATION  11/22/2015   CARDIAC CATHETERIZATION N/A 11/22/2015   Procedure: Left Heart Cath and Cors/Grafts Angiography;  Surgeon: Marykay Lex, MD;  Location: [facility] INVASIVE CV LAB;  Service: Cardiovascular;  Laterality: N/A;   CARDIAC CATHETERIZATION N/A 11/22/2015   Procedure: Coronary Stent Intervention;  Surgeon: Marykay Lex, MD;  Location: [facility] INVASIVE CV LAB;  Service: Cardiovascular;  Laterality: N/A;   CATARACT EXTRACTION, BILATERAL Bilateral    CESAREAN SECTION  1977   CORONARY ANGIOPLASTY WITH STENT PLACEMENT  "several"   "last one was 11/2014:  DES to the posterolateral branch /notes 11/20/2015   CORONARY ARTERY BYPASS GRAFT  2003    [place]; CABG X 4   CYSTOSCOPY WITH INJECTION N/A 03/23/2022   Procedure: CYSTOSCOPY WITH INJECTION;  Surgeon: Malen Gauze, MD;  Location: [facility];  Service: Urology;  Laterality: N/A;   DILATION AND CURETTAGE OF UTERUS     KIDNEY STONE SURGERY     "opened me up"   LAPAROSCOPIC CHOLECYSTECTOMY     LEFT HEART CATH AND CORS/GRAFTS ANGIOGRAPHY N/A 01/03/2018   Procedure: LEFT HEART CATH AND CORS/GRAFTS ANGIOGRAPHY;  Surgeon: [name], Peter M, MD;  Location: [facility] INVASIVE CV LAB;  Service: Cardiovascular;  Laterality: N/A;   TONSILLECTOMY     TUBAL LIGATION      Home Medications:  Allergies as of 12/18/2022   No Known Allergies      Medication List        Accurate as of December 18, 2022 12:32 PM. If you have any questions, ask your nurse or doctor.          acetaminophen 500 MG tablet Commonly known as: TYLENOL Take 1,000 mg by mouth every 8 (eight) hours as needed for moderate pain.   aspirin 81 MG tablet Take 81 mg by mouth at bedtime.   bisoprolol 5 MG tablet Commonly known as: ZEBETA Take 0.5 tablets (2.5 mg total) by mouth daily.   isosorbide mononitrate 30 MG 24 hr tablet Commonly known as: IMDUR TAKE 1 TABLET BY MOUTH TWICE DAILY   levothyroxine 75 MCG tablet Commonly known as: SYNTHROID TAKE  1 TABLET BY MOUTH EVERY DAY BEFORE BREAKFAST   lisinopril 5 MG tablet Commonly known as: ZESTRIL Take 1 tablet (5 mg total) by mouth daily.   loratadine 10 MG tablet Commonly known as: CLARITIN Take 10 mg by mouth daily.   nitrofurantoin (macrocrystal-monohydrate) 100 MG capsule Commonly known as: MACROBID Take 1 capsule (100 mg total) by mouth every 12 (twelve) hours.   nitroGLYCERIN 0.4 MG SL tablet Commonly known as: NITROSTAT DISSOLVE 1 TABLET UNDER THE TONGUE EVERY 5 MINUTES AS NEEDED FOR CHEST PAIN. DO NOT EXCEED A TOTAL OF 3 DOSES IN 15 MINUTES.   PARoxetine 40 MG tablet Commonly known as: PAXIL Take 1 tablet (40 mg total) by mouth daily.    rosuvastatin 20 MG tablet Commonly known as: CRESTOR Take 1 tablet (20 mg total) by mouth in the morning.        Allergies: No Known Allergies  Family History: Family History  Problem Relation Age of Onset   Heart attack Mother    Heart disease Mother    Heart disease Brother     Social History:  reports that she has never smoked. She has never used smokeless tobacco. She reports that she does not currently use alcohol. She reports that she does not use drugs.  ROS: All other review of systems were reviewed and are negative except what is noted above in HPI  Physical Exam: BP 111/79   Pulse 65   Constitutional:  Alert and oriented, No acute distress. HEENT: [place] AT, moist mucus membranes.  Trachea midline, no masses. Cardiovascular: No clubbing, cyanosis, or edema. Respiratory: Normal respiratory effort, no increased work of breathing. GI: Abdomen is soft, nontender, nondistended, no abdominal masses GU: No CVA tenderness.  Lymph: No cervical or inguinal lymphadenopathy. Skin: No rashes, bruises or suspicious lesions. Neurologic: Grossly intact, no focal deficits, moving all 4 extremities. Psychiatric: Normal mood and affect.  Laboratory Data: Lab Results  Component Value Date   WBC 6.7 05/17/2020   HGB 13.9 05/17/2020   HCT 41.3 05/17/2020   MCV 91 05/17/2020   PLT 321 05/17/2020    Lab Results  Component Value Date   CREATININE 1.35 (H) 03/18/2022    No results found for: "PSA"  No results found for: "TESTOSTERONE"  No results found for: "HGBA1C"  Urinalysis    Component Value Date/Time   APPEARANCEUR Cloudy (A) 10/22/2022 1422   GLUCOSEU Negative 10/22/2022 1422   BILIRUBINUR Negative 10/22/2022 1422   PROTEINUR Negative 10/22/2022 1422   NITRITE Positive (A) 10/22/2022 1422   LEUKOCYTESUR Trace (A) 10/22/2022 1422    Lab Results  Component Value Date   LABMICR See below: 10/22/2022   WBCUA 6-10 (A) 10/22/2022   LABEPIT 0-10 10/22/2022    BACTERIA Many (A) 10/22/2022    Pertinent Imaging:  No results found for this or any previous visit.  No results found for this or any previous visit.  No results found for this or any previous visit.  No results found for this or any previous visit.  No results found for this or any previous visit.  No valid procedures specified. No results found for this or any previous visit.  No results found for this or any previous visit.   Assessment & Plan:    1. OAB (overactive bladder) 5 months for intravesical botox. - Urinalysis, Routine w reflex microscopic - BLADDER SCAN AMB NON-IMAGING   No follow-ups on file.  Wilkie Aye, MD  [facility] [place]

## 2022-12-18 NOTE — Patient Instructions (Signed)

## 2022-12-21 DIAGNOSIS — R54 Age-related physical debility: Secondary | ICD-10-CM | POA: Diagnosis not present

## 2022-12-21 DIAGNOSIS — E039 Hypothyroidism, unspecified: Secondary | ICD-10-CM | POA: Diagnosis not present

## 2022-12-21 DIAGNOSIS — I10 Essential (primary) hypertension: Secondary | ICD-10-CM | POA: Diagnosis not present

## 2022-12-21 DIAGNOSIS — E782 Mixed hyperlipidemia: Secondary | ICD-10-CM | POA: Diagnosis not present

## 2022-12-21 DIAGNOSIS — I25709 Atherosclerosis of coronary artery bypass graft(s), unspecified, with unspecified angina pectoris: Secondary | ICD-10-CM | POA: Diagnosis not present

## 2023-01-19 DIAGNOSIS — I2510 Atherosclerotic heart disease of native coronary artery without angina pectoris: Secondary | ICD-10-CM | POA: Diagnosis not present

## 2023-01-19 DIAGNOSIS — I10 Essential (primary) hypertension: Secondary | ICD-10-CM | POA: Diagnosis not present

## 2023-01-19 DIAGNOSIS — E039 Hypothyroidism, unspecified: Secondary | ICD-10-CM | POA: Diagnosis not present

## 2023-01-19 DIAGNOSIS — R54 Age-related physical debility: Secondary | ICD-10-CM | POA: Diagnosis not present

## 2023-01-19 DIAGNOSIS — E782 Mixed hyperlipidemia: Secondary | ICD-10-CM | POA: Diagnosis not present

## 2023-01-28 DIAGNOSIS — E669 Obesity, unspecified: Secondary | ICD-10-CM | POA: Diagnosis not present

## 2023-01-28 DIAGNOSIS — I10 Essential (primary) hypertension: Secondary | ICD-10-CM | POA: Diagnosis not present

## 2023-01-28 DIAGNOSIS — E785 Hyperlipidemia, unspecified: Secondary | ICD-10-CM | POA: Diagnosis not present

## 2023-01-28 DIAGNOSIS — R799 Abnormal finding of blood chemistry, unspecified: Secondary | ICD-10-CM | POA: Diagnosis not present

## 2023-01-28 DIAGNOSIS — Z955 Presence of coronary angioplasty implant and graft: Secondary | ICD-10-CM | POA: Diagnosis not present

## 2023-01-28 DIAGNOSIS — Z951 Presence of aortocoronary bypass graft: Secondary | ICD-10-CM | POA: Diagnosis not present

## 2023-01-28 DIAGNOSIS — Z7982 Long term (current) use of aspirin: Secondary | ICD-10-CM | POA: Diagnosis not present

## 2023-01-28 DIAGNOSIS — I2510 Atherosclerotic heart disease of native coronary artery without angina pectoris: Secondary | ICD-10-CM | POA: Diagnosis not present

## 2023-01-28 DIAGNOSIS — Z6838 Body mass index (BMI) 38.0-38.9, adult: Secondary | ICD-10-CM | POA: Diagnosis not present

## 2023-01-28 DIAGNOSIS — R7989 Other specified abnormal findings of blood chemistry: Secondary | ICD-10-CM | POA: Diagnosis not present

## 2023-01-28 DIAGNOSIS — I214 Non-ST elevation (NSTEMI) myocardial infarction: Secondary | ICD-10-CM | POA: Diagnosis not present

## 2023-01-28 DIAGNOSIS — R011 Cardiac murmur, unspecified: Secondary | ICD-10-CM | POA: Diagnosis not present

## 2023-01-28 DIAGNOSIS — E039 Hypothyroidism, unspecified: Secondary | ICD-10-CM | POA: Diagnosis not present

## 2023-01-28 LAB — HEMOGLOBIN A1C: A1c: 5.5

## 2023-01-28 LAB — LAB REPORT - SCANNED: POC INR: 0.96

## 2023-02-01 ENCOUNTER — Telehealth: Payer: Self-pay | Admitting: *Deleted

## 2023-02-01 NOTE — Telephone Encounter (Signed)
Left message for patient to call office. Need name of ER for chart prep.

## 2023-02-01 NOTE — Telephone Encounter (Signed)
Reports being seen at [facility] ED in [facility] on Wednesday night (01/27/2023) for chest pain. Says she has all ED records including EKG and will bring to visit tomorrow.

## 2023-02-02 ENCOUNTER — Encounter: Payer: Self-pay | Admitting: Cardiology

## 2023-02-02 ENCOUNTER — Ambulatory Visit: Payer: PPO | Attending: Cardiology | Admitting: Cardiology

## 2023-02-02 VITALS — BP 128/76 | HR 71 | Ht 62.5 in | Wt 216.6 lb

## 2023-02-02 DIAGNOSIS — I10 Essential (primary) hypertension: Secondary | ICD-10-CM

## 2023-02-02 DIAGNOSIS — I25119 Atherosclerotic heart disease of native coronary artery with unspecified angina pectoris: Secondary | ICD-10-CM

## 2023-02-02 DIAGNOSIS — E782 Mixed hyperlipidemia: Secondary | ICD-10-CM | POA: Diagnosis not present

## 2023-02-02 NOTE — Patient Instructions (Signed)
Medication Instructions:  Your physician recommends that you continue on your current medications as directed. Please refer to the Current Medication list given to you today.   Labwork: None  Testing/Procedures: None  Follow-Up: Your physician recommends that you schedule a follow-up appointment in: 6 weeks   Any Other Special Instructions Will Be Listed Below (If Applicable).     If you need a refill on your cardiac medications before your next appointment, please call your pharmacy.   

## 2023-02-02 NOTE — Progress Notes (Signed)
Cardiology Office Note  Date: 02/02/2023   ID: Mickala, Poteat [birth year], MRN [id number]  History of Present Illness: Lacey Bailey is a 77 y.o. female last seen in March.  She presents for a follow-up visit with her husband today after outside ER evaluation in [place] on June 5.  I reviewed the records that she brought with her.  With unstable angina at that time, more prolonged episode of chest discomfort in the evening than usual.  Chest x-ray and chest CTA did not report any acute findings.  High-sensitivity troponin I levels were 894 and 740 respectively.  There was discussion at that time about transfer to a higher level of care for consideration of cardiac catheterization, however the patient elected not to pursue this and requested discharge home.  She tells me that since she has been home she has had no further symptoms.  We discussed her test results and implications, likely did have a minor cardiac event based on workup.  She reports compliance with her medications and otherwise indicates being at baseline now.  Physical Exam: VS:  BP 128/76   Pulse 71   Ht 5' 2.5" (1.588 m)   Wt 216 lb 9.6 oz (98.2 kg)   SpO2 94%   BMI 38.99 kg/m , BMI Body mass index is 38.99 kg/m.  Wt Readings from Last 3 Encounters:  02/02/23 216 lb 9.6 oz (98.2 kg)  11/02/22 215 lb (97.5 kg)  04/22/22 209 lb (94.8 kg)    General: Patient appears comfortable at rest. HEENT: Conjunctiva and lids normal, oropharynx clear with moist mucosa. Neck: Supple, no elevated JVP or carotid bruits, no thyromegaly. Lungs: Clear to auscultation, nonlabored breathing at rest. Cardiac: Regular rate and rhythm, no S3 or significant systolic murmur, no pericardial rub. Abdomen: Soft, nontender, no hepatomegaly, bowel sounds present, no guarding or rebound. Extremities: No pitting edema, distal pulses 2+. Skin: Warm and dry. Musculoskeletal: No kyphosis. Neuropsychiatric: Alert and oriented x3, affect  grossly appropriate.  ECG:  An ECG dated 03/18/2022 was personally reviewed today and demonstrated:  Sinus rhythm with LVH and repolarization abnormalities.  Labwork: 03/18/2022: BUN 21; Creatinine, Ser 1.35; Potassium 4.2; Sodium 140     Component Value Date/Time   CHOL 127 05/17/2020 1113   TRIG 157 (H) 05/17/2020 1113   HDL 37 (L) 05/17/2020 1113   CHOLHDL 3.4 05/17/2020 1113   CHOLHDL 6.0 04/20/2017 1100   VLDL 69 (H) 04/20/2017 1100   LDLCALC 63 05/17/2020 1113  February 2024: Hemoglobin 13.1, platelets 294, cholesterol 130, triglycerides 165, HDL 37, LDL 65, hemoglobin A1c 5.7%, TSH 3.95 June 2024: Cholesterol 105, triglycerides 122, HDL 33, LDL 48, hemoglobin 13, platelets 301, hemoglobin A1c 5.5%,, potassium 3.8, BUN 17, creatinine 1.24  Other Studies Reviewed Today:  No interval cardiac testing for review today.  Assessment and Plan:  1.  Multivessel CAD status post CABG in 2003 and multiple PCI's at outside hospitals.  She underwent DES to the PLB in 2016, angioplasty of the mid RCA and mid circumflex in 2017 with documentation of patent LIMA to LAD and occlusion of all vein grafts at that time.  Most recent angiography in 2019 showed continued patency of the LIMA to LAD with occlusion of all vein grafts as well as native circumflex and RCA.  Redo CABG was not recommended at that time and revascularization options are limited.  She had a recent ER encounter as discussed above with unstable angina and fairly mild high-sensitivity troponin I levels  in the 700 800 range.  Symptoms have returned to baseline with no change in baseline medical therapy.  We have discussed options including continuing current course, follow-up noninvasive imaging (which would almost certainly be abnormal and not necessarily help to guide therapy based on her known coronary anatomy), or pursuing a follow-up cardiac catheterization if her symptoms continue to escalate.  We will continue the current course for  now after discussion and arrange office follow-up.  Continue aspirin, bisoprolol, Imdur, lisinopril, Crestor, and as needed nitroglycerin.  2.  Mixed hyperlipidemia, recent LDL 48 on Crestor.  3.  Essential hypertension.  Blood pressure well-controlled today.  Disposition:  Follow up  6 weeks.  Signed, Jonelle Sidle, M.D., F.A.C.C.  [facility] at [facility]

## 2023-02-12 ENCOUNTER — Other Ambulatory Visit: Payer: Self-pay | Admitting: Cardiology

## 2023-03-18 ENCOUNTER — Encounter: Payer: Self-pay | Admitting: Nurse Practitioner

## 2023-03-18 ENCOUNTER — Ambulatory Visit: Payer: PPO | Attending: Nurse Practitioner | Admitting: Nurse Practitioner

## 2023-03-18 VITALS — BP 114/80 | HR 64 | Ht 62.0 in | Wt 219.6 lb

## 2023-03-18 DIAGNOSIS — R5383 Other fatigue: Secondary | ICD-10-CM

## 2023-03-18 DIAGNOSIS — I25119 Atherosclerotic heart disease of native coronary artery with unspecified angina pectoris: Secondary | ICD-10-CM | POA: Diagnosis not present

## 2023-03-18 DIAGNOSIS — E782 Mixed hyperlipidemia: Secondary | ICD-10-CM | POA: Diagnosis not present

## 2023-03-18 DIAGNOSIS — R0789 Other chest pain: Secondary | ICD-10-CM | POA: Diagnosis not present

## 2023-03-18 DIAGNOSIS — I10 Essential (primary) hypertension: Secondary | ICD-10-CM | POA: Diagnosis not present

## 2023-03-18 NOTE — Progress Notes (Signed)
Cardiology Office Note:  .   Date: 03/18/2023 ID:  Lacey Bailey, DOB 21-Jun-1946, MRN [id number] PCP: Renaldo Harrison, NP  [facility] Providers Cardiologist:  Nona Dell, MD    History of Present Illness: .   Lacey Bailey is a very delightful 77 y.o. female with a PMH of multivessel CAD, s/p CABG in 2003, mixed HLD, HTN, hx of TIA, hypothyroidism, and morbid obesity, who presents today for 6 week follow-up.   Previous cardiovascular history of multiple PCI's at outside hospitals. Underwent DES to PLB in 206, angioplasty of mRCA and mid circumflex in 2017, with patent LIMA-LAD and occlusion of vein grafts during that time. Angiography in 2019 showed patency of LIMA-LAD with occluded vein grafts and native circumflex and RCA. Redo CABG wasn't recommended, there were limited revascularization options.   ED visit in [place], [place] on 01/27/2023 for unstable angina. CXR and chest CTA negative for anything acute. Troponin I levels were 894, 740, and was discussed to transfer to another facility for cardiac cath, however pt declined and requested to d/c home.   Last seen by Dr. Diona Browner on February 02, 2023. She denied any further symptoms since returning home. Indicated her symptoms were at baseline. Medication management was continued.   Today she presents for 6 week follow-up. Continues to remain at her baseline per her report. Says ht is hard for her to "stand the summer heat" and reports lack of stamina d/t this. Not able to sing in her church choir due to this and says she misses this. Does report using nitroglycerin around 1-2 times per week. Denies chest pain but admits to occasional chest pressure, difficult for her to describe, but says this does appear stable since last OV. She does admit to fatigue, sleeping around 12 hours per night recently. Denies any  palpitations, syncope, presyncope, dizziness, orthopnea, PND, swelling or significant weight changes, acute bleeding, or  claudication.  Studies Reviewed: Marland Kitchen    LHC: 12/2017: Post Atrio lesion is 20% stenosed. Prox RCA to Mid RCA lesion is 100% stenosed. Prox Cx to Dist Cx lesion is 100% stenosed. Ost LAD to Prox LAD lesion is 100% stenosed. LIMA graft was visualized by angiography and is large. The graft exhibits no disease. SVG graft was visualized by angiography. SVG graft was visualized by angiography. SVG graft was visualized by angiography. Origin lesion is 100% stenosed. Origin to Prox Graft lesion is 100% stenosed. Origin to Prox Graft lesion is 100% stenosed. The left ventricular systolic function is normal. LV end diastolic pressure is normal. The left ventricular ejection fraction is 55-65% by visual estimate.   1. Severe 3 vessel occlusive CAD 2. Patent LIMA to the LAD 3. Occluded SVG to diagonal 4. Occluded SVG to the OM2 5. Occluded SVG to the PDA 6. Good LV function 7. Normal LVEDP   Plan: Compared to prior cardiac cath in March 2017 the stents in the mid to distal LCx and mid RCA are completely occluded. Given extensive work done on these vessels previously she is not a candidate for CTO PCI. The only consideration would be whether she is a candidate for redo CABG. Her RCA appears graftable. Based on old films the OM2 may be graftable. I would obtain surgical consultation. Will stop Plavix. Continue other medical therapy.  Echo 04/2017: - Left ventricle: The cavity size was normal. Wall thickness was    increased in a pattern of moderate LVH. Systolic function was    normal. The estimated ejection fraction was  in the range of 55%    to 60%. Wall motion was normal; there were no regional wall    motion abnormalities. Doppler parameters are consistent with    abnormal left ventricular relaxation (grade 1 diastolic    dysfunction). There was no evidence of elevated ventricular    filling pressure by Doppler parameters.  - Aortic valve: Mildly calcified annulus. Trileaflet; mildly     thickened leaflets. Valve area (VTI): 2.36 cm^2. Valve area    (Vmax): 1.98 cm^2. Valve area (Vmean): 2.13 cm^2.  - Atrial septum: No defect or patent foramen ovale was identified.  - Technically adequate study.  Physical Exam:   VS:  BP 114/80   Pulse 64   Ht 5\' 2"  (1.575 m)   Wt 219 lb 9.6 oz (99.6 kg)   SpO2 97%   BMI 40.17 kg/m    Wt Readings from Last 3 Encounters:  03/18/23 219 lb 9.6 oz (99.6 kg)  02/02/23 216 lb 9.6 oz (98.2 kg)  11/02/22 215 lb (97.5 kg)    GEN: Morbidly obese, 77 y.o. female in no acute distress NECK: No JVD; No carotid bruits CARDIAC: S1/S2,RRR, no murmurs, rubs, gallops RESPIRATORY:  Clear to auscultation without rales, wheezing or rhonchi  ABDOMEN: Soft, non-tender, non-distended EXTREMITIES:  No edema; No deformity   ASSESSMENT AND PLAN: .    Multivessel CAD, s/p CABG, chest pressure Continues to note occasional chest pressure, does report taking NTG at times. Says her symptoms are at baseline. Discussed options regarding observation, medical management, ischemic evaluation, or cardiac catheterization, and she elected to continue to observe for now and will notify if her symptoms escalate. Continue Aspirin, Bisoprolol, Imdur, Lisinopril, Rosuvastatin, and NTG PRN. Care and ED precautions discussed.  Mixed HLD Most recent LDL 48. Continue Crestor. Heart healthy diet and regular cardiovascular exercise as tolerated encouraged.   HTN BP stable. Discussed to monitor BP at home at least 2 hours after medications and sitting for 5-10 minutes. Continue bisoprolol, Imdur, and lisinopril.   Morbid obesity Weight loss via diet and exercise encouraged. Discussed the impact being overweight would have on cardiovascular risk.  Fatigue Etiology multifactorial. Discussed/recommended to speak with PCP. Care precautions discussed. Heart healthy diet and regular cardiovascular exercise as tolerated encouraged.   Dispo: Follow-up with me or APP in 3 months or  sooner if anything changes.   Signed, Sharlene Dory, NP

## 2023-03-18 NOTE — Patient Instructions (Addendum)

## 2023-03-24 DIAGNOSIS — I2510 Atherosclerotic heart disease of native coronary artery without angina pectoris: Secondary | ICD-10-CM | POA: Diagnosis not present

## 2023-03-24 DIAGNOSIS — I10 Essential (primary) hypertension: Secondary | ICD-10-CM | POA: Diagnosis not present

## 2023-03-24 DIAGNOSIS — E039 Hypothyroidism, unspecified: Secondary | ICD-10-CM | POA: Diagnosis not present

## 2023-03-24 DIAGNOSIS — E782 Mixed hyperlipidemia: Secondary | ICD-10-CM | POA: Diagnosis not present

## 2023-03-24 DIAGNOSIS — R54 Age-related physical debility: Secondary | ICD-10-CM | POA: Diagnosis not present

## 2023-05-12 ENCOUNTER — Ambulatory Visit: Payer: PPO | Admitting: Cardiology

## 2023-05-13 ENCOUNTER — Telehealth: Payer: Self-pay | Admitting: Urology

## 2023-05-13 NOTE — Telephone Encounter (Signed)
Patient would like you to call her in a muscle relaxer before her procedure on Monday ?

## 2023-05-17 ENCOUNTER — Other Ambulatory Visit: Payer: Self-pay | Admitting: Cardiology

## 2023-05-17 ENCOUNTER — Ambulatory Visit (INDEPENDENT_AMBULATORY_CARE_PROVIDER_SITE_OTHER): Payer: PPO | Admitting: Urology

## 2023-05-17 VITALS — BP 118/78 | HR 65

## 2023-05-17 DIAGNOSIS — N3281 Overactive bladder: Secondary | ICD-10-CM

## 2023-05-17 MED ORDER — DIAZEPAM 10 MG PO TABS: 10.0000 mg | ORAL_TABLET | Freq: Once | ORAL | 0 refills | Status: AC

## 2023-05-17 NOTE — Progress Notes (Unsigned)
Patient noted in office today diaphoretic with sob. Patient brought to room and vitals taken. Vitals noted to be stable. Patient began to feel better after resting in room. Patient assessed by MD. Appointment rescheduled.

## 2023-05-19 DIAGNOSIS — E782 Mixed hyperlipidemia: Secondary | ICD-10-CM | POA: Diagnosis not present

## 2023-05-19 DIAGNOSIS — I2510 Atherosclerotic heart disease of native coronary artery without angina pectoris: Secondary | ICD-10-CM | POA: Diagnosis not present

## 2023-05-19 DIAGNOSIS — I10 Essential (primary) hypertension: Secondary | ICD-10-CM | POA: Diagnosis not present

## 2023-05-19 DIAGNOSIS — E039 Hypothyroidism, unspecified: Secondary | ICD-10-CM | POA: Diagnosis not present

## 2023-05-24 DIAGNOSIS — R54 Age-related physical debility: Secondary | ICD-10-CM | POA: Diagnosis not present

## 2023-05-24 DIAGNOSIS — I10 Essential (primary) hypertension: Secondary | ICD-10-CM | POA: Diagnosis not present

## 2023-05-24 DIAGNOSIS — E039 Hypothyroidism, unspecified: Secondary | ICD-10-CM | POA: Diagnosis not present

## 2023-05-24 DIAGNOSIS — E782 Mixed hyperlipidemia: Secondary | ICD-10-CM | POA: Diagnosis not present

## 2023-05-24 DIAGNOSIS — I25709 Atherosclerosis of coronary artery bypass graft(s), unspecified, with unspecified angina pectoris: Secondary | ICD-10-CM | POA: Diagnosis not present

## 2023-06-06 ENCOUNTER — Other Ambulatory Visit: Payer: Self-pay | Admitting: Cardiology

## 2023-06-10 DIAGNOSIS — G459 Transient cerebral ischemic attack, unspecified: Secondary | ICD-10-CM | POA: Diagnosis not present

## 2023-06-10 DIAGNOSIS — R54 Age-related physical debility: Secondary | ICD-10-CM | POA: Diagnosis not present

## 2023-06-10 DIAGNOSIS — E039 Hypothyroidism, unspecified: Secondary | ICD-10-CM | POA: Diagnosis not present

## 2023-06-10 DIAGNOSIS — I10 Essential (primary) hypertension: Secondary | ICD-10-CM | POA: Diagnosis not present

## 2023-06-10 DIAGNOSIS — I25709 Atherosclerosis of coronary artery bypass graft(s), unspecified, with unspecified angina pectoris: Secondary | ICD-10-CM | POA: Diagnosis not present

## 2023-06-10 DIAGNOSIS — E782 Mixed hyperlipidemia: Secondary | ICD-10-CM | POA: Diagnosis not present

## 2023-06-16 ENCOUNTER — Telehealth: Payer: Self-pay | Admitting: Family Medicine

## 2023-06-16 ENCOUNTER — Ambulatory Visit
Admission: EM | Admit: 2023-06-16 | Discharge: 2023-06-16 | Disposition: A | Discharge status: Home / Self Care | Payer: PPO | Attending: Family Medicine | Admitting: Family Medicine

## 2023-06-16 ENCOUNTER — Ambulatory Visit: Payer: PPO

## 2023-06-16 ENCOUNTER — Encounter: Payer: Self-pay | Admitting: Emergency Medicine

## 2023-06-16 DIAGNOSIS — J069 Acute upper respiratory infection, unspecified: Secondary | ICD-10-CM | POA: Diagnosis not present

## 2023-06-16 DIAGNOSIS — R058 Other specified cough: Secondary | ICD-10-CM | POA: Diagnosis not present

## 2023-06-16 DIAGNOSIS — R051 Acute cough: Secondary | ICD-10-CM

## 2023-06-16 DIAGNOSIS — K449 Diaphragmatic hernia without obstruction or gangrene: Secondary | ICD-10-CM | POA: Diagnosis not present

## 2023-06-16 MED ORDER — ALBUTEROL SULFATE HFA 108 (90 BASE) MCG/ACT IN AERS: 2.0000 | INHALATION_SPRAY | RESPIRATORY_TRACT | 0 refills | Status: DC | PRN

## 2023-06-16 MED ORDER — PROMETHAZINE-DM 6.25-15 MG/5ML PO SYRP: 5.0000 mL | ORAL_SOLUTION | Freq: Four times a day (QID) | ORAL | 0 refills | Status: DC | PRN

## 2023-06-16 MED ORDER — PREDNISONE 20 MG PO TABS: 40.0000 mg | ORAL_TABLET | Freq: Every day | ORAL | 0 refills | Status: DC

## 2023-06-16 NOTE — Discharge Instructions (Signed)
I will call you when your chest x-ray comes back so that we can discuss findings on this and a plan.

## 2023-06-16 NOTE — ED Triage Notes (Signed)
Dry cough since Sunday with nasal congestion.  Has been taking mucinex, nyquil and tylenol with very little relief.  States ear hurt from time to time.

## 2023-06-16 NOTE — Telephone Encounter (Signed)
Called patient, verified identity and spoke with patient about her chest x-ray which showed no acute abnormalities.  Will treat for viral bronchitis with prednisone, Phenergan DM, albuterol inhaler.  These were sent to the pharmacy, patient aware and agreeable to plan.

## 2023-06-16 NOTE — ED Provider Notes (Signed)
[facility]    CSN: [id number] Arrival date & time: 06/16/23  1408      History   Chief Complaint No chief complaint on file.   HPI Lacey Bailey is a 77 y.o. female.   Patient presenting today with 4-day history of progressively worsening nasal congestion, productive cough, fatigue, ear pressure, scratchy throat.  Denies fever, chills, body aches, chest pain, shortness of breath, abdominal pain, nausea vomiting or diarrhea.  States sometimes she is throwing up because she coughs so hard, particularly overnight.  Denies known history of chronic pulmonary disease.  Taking Mucinex, NyQuil and Tylenol with minimal relief.    Past Medical History:  Diagnosis Date   Aortic sclerosis    CAD (coronary artery disease)    a. s/p CABG in 2003 b. multiple interventions since at outside hospitals c. DES to PLB in 11/2014 d. angioplasty to mid-RCA and mid-LCx in 10/2015 with patent LIMA-LAD and occlusion of all vein grafts e. 12/2017: occlusion of vein grafts and native LCx and RCA with patent LIMA-LAD. Re-do CABG NOT recommended by CT surgery   Depression    Essential hypertension    History of kidney stones    Hyperlipidemia    Hypothyroidism    Sleep apnea    TIA (transient ischemic attack) ~ 2013    Patient Active Problem List   Diagnosis Date Noted   Chronic ear pain, right 01/27/2021   OSA (obstructive sleep apnea) 11/04/2020   Hypotension 03/30/2018   Asthma 06/14/2017   Fatigue 12/11/2015   Accelerating angina (HCC) 11/22/2015   CAD S/P percutaneous coronary angioplasty 11/22/2015   Depression 09/03/2015   Hypothyroidism 09/03/2015   Atherosclerosis of coronary artery bypass graft with angina pectoris (HCC) 09/03/2015   Essential hypertension 09/03/2015   Hyperlipidemia with target LDL less than 70 09/03/2015    Past Surgical History:  Procedure Laterality Date   ABDOMINAL HYSTERECTOMY     APPENDECTOMY     BLEPHAROPLASTY Bilateral    "uppers"   BOTOX  INJECTION N/A 03/23/2022   Procedure: BOTOX INJECTION- 100 units;  Surgeon: Malen Gauze, MD;  Location: [facility];  Service: Urology;  Laterality: N/A;   CARDIAC CATHETERIZATION  11/22/2015   CARDIAC CATHETERIZATION N/A 11/22/2015   Procedure: Left Heart Cath and Cors/Grafts Angiography;  Surgeon: Marykay Lex, MD;  Location: [facility] INVASIVE CV LAB;  Service: Cardiovascular;  Laterality: N/A;   CARDIAC CATHETERIZATION N/A 11/22/2015   Procedure: Coronary Stent Intervention;  Surgeon: Marykay Lex, MD;  Location: [facility] INVASIVE CV LAB;  Service: Cardiovascular;  Laterality: N/A;   CATARACT EXTRACTION, BILATERAL Bilateral    CESAREAN SECTION  1977   CORONARY ANGIOPLASTY WITH STENT PLACEMENT  "several"   "last one was 11/2014:  DES to the posterolateral branch /notes 11/20/2015   CORONARY ARTERY BYPASS GRAFT  2003   [place]; CABG X 4   CYSTOSCOPY WITH INJECTION N/A 03/23/2022   Procedure: CYSTOSCOPY WITH INJECTION;  Surgeon: Malen Gauze, MD;  Location: [facility];  Service: Urology;  Laterality: N/A;   DILATION AND CURETTAGE OF UTERUS     KIDNEY STONE SURGERY     "opened me up"   LAPAROSCOPIC CHOLECYSTECTOMY     LEFT HEART CATH AND CORS/GRAFTS ANGIOGRAPHY N/A 01/03/2018   Procedure: LEFT HEART CATH AND CORS/GRAFTS ANGIOGRAPHY;  Surgeon: [name], Peter M, MD;  Location: [facility] INVASIVE CV LAB;  Service: Cardiovascular;  Laterality: N/A;   TONSILLECTOMY     TUBAL LIGATION      OB History  No obstetric history on file.      Home Medications    Prior to Admission medications   Medication Sig Start Date End Date Taking? Authorizing Provider  acetaminophen (TYLENOL) 500 MG tablet Take 1,000 mg by mouth every 8 (eight) hours as needed for moderate pain.    [provider]  albuterol (VENTOLIN HFA) 108 (90 Base) MCG/ACT inhaler Inhale 2 puffs into the lungs every 4 (four) hours as needed. 06/16/23   Particia Nearing, PA-C  aspirin 81 MG tablet Take 81 mg by mouth at bedtime.      [provider]  bisoprolol (ZEBETA) 5 MG tablet Take 0.5 tablets (2.5 mg total) by mouth daily. 01/08/21   Jonelle Sidle, MD  isosorbide mononitrate (IMDUR) 30 MG 24 hr tablet TAKE 1 TABLET BY MOUTH TWICE DAILY 05/17/23   Jonelle Sidle, MD  levothyroxine (SYNTHROID) 75 MCG tablet TAKE 1 TABLET BY MOUTH EVERY DAY BEFORE BREAKFAST 11/04/20   Ladona Ridgel, Malena M, DO  lisinopril (ZESTRIL) 5 MG tablet Take 1 tablet (5 mg total) by mouth daily. 11/04/20   Laroy Apple M, DO  loratadine (CLARITIN) 10 MG tablet Take 10 mg by mouth daily as needed.    [provider]  nitroGLYCERIN (NITROSTAT) 0.4 MG SL tablet DISSOLVE 1 TABLET UNDER THE TONGUE EVERY 5 MINUTES AS NEEDED FOR CHEST PAIN. DO NOT EXCEED A TOTAL OF 3 DOSES IN 15 MINUTES. 06/07/23   Jonelle Sidle, MD  PARoxetine (PAXIL) 40 MG tablet Take 1 tablet (40 mg total) by mouth daily. 11/04/20   Laroy Apple M, DO  predniSONE (DELTASONE) 20 MG tablet Take 2 tablets (40 mg total) by mouth daily with breakfast. 06/16/23   Particia Nearing, PA-C  promethazine-dextromethorphan (PROMETHAZINE-DM) 6.25-15 MG/5ML syrup Take 5 mLs by mouth 4 (four) times daily as needed. 06/16/23   Particia Nearing, PA-C  rosuvastatin (CRESTOR) 20 MG tablet Take 1 tablet (20 mg total) by mouth in the morning. 11/04/20   Ladona Ridgel, Malena M, DO  SODIUM FLUORIDE 5000 PPM 1.1 % GEL dental gel Take by mouth. 12/08/22   [provider]    Family History Family History  Problem Relation Age of Onset   Heart attack Mother    Heart disease Mother    Heart disease Brother     Social History Social History   Tobacco Use   Smoking status: Never   Smokeless tobacco: Never  Vaping Use   Vaping status: Never Used  Substance Use Topics   Alcohol use: Not Currently    Alcohol/week: 0.0 standard drinks of alcohol    Comment: 11/22/2015 "nothing since ~ 2005; occasionally had a drink w/dinner before 2005"   Drug use: No     Allergies    Patient has no known allergies.   Review of Systems Review of Systems Per HPI  Physical Exam Triage Vital Signs ED Triage Vitals  Encounter Vitals Group     BP 06/16/23 1436 99/67     Systolic BP Percentile --      Diastolic BP Percentile --      Pulse Rate 06/16/23 1436 84     Resp 06/16/23 1436 20     Temp 06/16/23 1436 99 F (37.2 C)     Temp Source 06/16/23 1436 Oral     SpO2 06/16/23 1436 96 %     Weight --      Height --      Head Circumference --  Peak Flow --      Pain Score 06/16/23 1439 0     Pain Loc --      Pain Education --      Exclude from Growth Chart --    No data found.  Updated Vital Signs BP 99/67 (BP Location: Right Arm)   Pulse 84   Temp 99 F (37.2 C) (Oral)   Resp 20   SpO2 96%   Visual Acuity Right Eye Distance:   Left Eye Distance:   Bilateral Distance:    Right Eye Near:   Left Eye Near:    Bilateral Near:     Physical Exam Vitals and nursing note reviewed.  Constitutional:      Appearance: Normal appearance.  HENT:     Head: Atraumatic.     Right Ear: Tympanic membrane and external ear normal.     Left Ear: Tympanic membrane and external ear normal.     Nose: Congestion present.     Mouth/Throat:     Mouth: Mucous membranes are moist.     Pharynx: Posterior oropharyngeal erythema present.  Eyes:     Extraocular Movements: Extraocular movements intact.     Conjunctiva/sclera: Conjunctivae normal.  Cardiovascular:     Rate and Rhythm: Normal rate and regular rhythm.     Heart sounds: Normal heart sounds.  Pulmonary:     Effort: Pulmonary effort is normal.     Breath sounds: Normal breath sounds. No wheezing or rales.  Musculoskeletal:        General: Normal range of motion.     Cervical back: Normal range of motion and neck supple.  Skin:    General: Skin is warm and dry.  Neurological:     Mental Status: She is alert and oriented to person, place, and time.  Psychiatric:        Mood and Affect: Mood normal.         Thought Content: Thought content normal.      UC Treatments / Results  Labs (all labs ordered are listed, but only abnormal results are displayed) Labs Reviewed - No data to display  EKG   Radiology DG Chest 2 View  Result Date: 06/16/2023 CLINICAL DATA:  Productive cough. EXAM: CHEST - 2 VIEW COMPARISON:  Chest x-ray 11/20/2015 FINDINGS: Patient is status post cardiac surgery. The heart size and mediastinal contours are within normal limits. Both lungs are clear. The visualized skeletal structures are unremarkable. Small hiatal hernia is again noted. IMPRESSION: 1. No active cardiopulmonary disease. 2. Small hiatal hernia. Electronically Signed   By: Darliss Cheney M.D.   On: 06/16/2023 18:00    Procedures Procedures (including critical care time)  Medications Ordered in UC Medications - No data to display  Initial Impression / Assessment and Plan / UC Course  I have reviewed the triage vital signs and the nursing notes.  Pertinent labs & imaging results that were available during my care of the patient were reviewed by me and considered in my medical decision making (see chart for details).     Vital signs and exam overall reassuring, suggestive of a viral bronchitis.  Chest x-ray negative for acute cardiopulmonary abnormality.  Discussed results with patient and will treat for a bronchitis type issue with prednisone, Phenergan DM, albuterol.  Discussed supportive home care and return precautions.  Final Clinical Impressions(s) / UC Diagnoses   Final diagnoses:  Upper respiratory tract infection, unspecified type  Acute cough     Discharge Instructions  I will call you when your chest x-ray comes back so that we can discuss findings on this and a plan.    ED Prescriptions   None    PDMP not reviewed this encounter.   Particia Nearing, [place] 06/16/23 1842

## 2023-06-19 ENCOUNTER — Ambulatory Visit
Admission: EM | Admit: 2023-06-19 | Discharge: 2023-06-19 | Disposition: A | Discharge status: Home / Self Care | Payer: PPO | Attending: Internal Medicine | Admitting: Internal Medicine

## 2023-06-19 ENCOUNTER — Ambulatory Visit: Payer: PPO

## 2023-06-19 ENCOUNTER — Encounter: Payer: Self-pay | Admitting: Emergency Medicine

## 2023-06-19 DIAGNOSIS — R0602 Shortness of breath: Secondary | ICD-10-CM | POA: Diagnosis not present

## 2023-06-19 DIAGNOSIS — R059 Cough, unspecified: Secondary | ICD-10-CM | POA: Diagnosis not present

## 2023-06-19 DIAGNOSIS — R051 Acute cough: Secondary | ICD-10-CM

## 2023-06-19 DIAGNOSIS — J209 Acute bronchitis, unspecified: Secondary | ICD-10-CM | POA: Diagnosis not present

## 2023-06-19 DIAGNOSIS — I517 Cardiomegaly: Secondary | ICD-10-CM | POA: Diagnosis not present

## 2023-06-19 MED ORDER — IPRATROPIUM-ALBUTEROL 0.5-2.5 (3) MG/3ML IN SOLN
3.0000 mL | Freq: Once | RESPIRATORY_TRACT | Status: AC
  Administered 2023-06-19: 3 mL via RESPIRATORY_TRACT

## 2023-06-19 MED ORDER — PREDNISONE 10 MG PO TABS: ORAL_TABLET | ORAL | 0 refills | Status: AC

## 2023-06-19 NOTE — ED Triage Notes (Signed)
Was seen on 10/23 for same.  States not any better.  States now cough is productive with yellow sputum.  State has been having "violent coughing spells"  states she took a nitroglycerin pill while in the waiting room because her chest was hurting after coughing. Has been taking mucinex and tylenol in addition to what was prescribed last time she was seen.

## 2023-06-19 NOTE — Discharge Instructions (Addendum)
I will call you if the chest x-ray shows any abnormal findings indicating a pneumonia. No news is good news. I would like to extend your prednisone steroid course. Continue taking prednisone 40 mg once daily for the next 2 days to finish out the course that Roosvelt Maser prescribed for you. Then start taking 20 mg once daily for 3 days, then 10 mg once daily for 3 days. Continue using albuterol inhaler as needed for cough, shortness of breath, and wheeze. Continue other medications as well.  If your symptoms do not improve in the next 1 to 2 days with use of these medications, I would like for you to go to the nearest emergency department for further evaluation.

## 2023-06-19 NOTE — ED Provider Notes (Addendum)
[facility]    CSN: [id number] Arrival date & time: 06/19/23  1042      History   Chief Complaint No chief complaint on file.   HPI Lacey WRITER is a 77 y.o. female.   Lacey Bailey is a 77 y.o. female presenting for chief complaint of persistent cough for the last 7 days.  Cough started out as intermittently productive but mostly dry with clear sputum.  Over the last 3 days, cough has become more productive with yellow/white sputum and she has become increasingly short of breath.  She reports significant chest discomfort bilaterally with coughing spells.  She took 1 pill of nitroglycerin while she was in the waiting room at urgent care to help with chest discomfort and states this only helped a little bit with the chest pain.  History of CAD and accelerating angina, follows with cardiology.  Last used nitroglycerin 2 to 3 days ago.  She has not needed to use more than 2 pills of nitroglycerin in "a long time" and states her cardiologist is aware of her frequent use of nitroglycerin.  She does not currently have a headache and denies dizziness.  She is unsure if she has had a fever at home.  She was seen urgent care 3 days ago where chest x-ray was negative for pneumonia but shows findings consistent with bronchitis.  Patient was prescribed prednisone burst, albuterol, Mucinex, and Promethazine DM to help with symptoms but she states that these medications have been helping and she is getting worse. History of asthma.  No recent antibiotic use.     Past Medical History:  Diagnosis Date   Aortic sclerosis    CAD (coronary artery disease)    a. s/p CABG in 2003 b. multiple interventions since at outside hospitals c. DES to PLB in 11/2014 d. angioplasty to mid-RCA and mid-LCx in 10/2015 with patent LIMA-LAD and occlusion of all vein grafts e. 12/2017: occlusion of vein grafts and native LCx and RCA with patent LIMA-LAD. Re-do CABG NOT recommended by CT surgery   Depression     Essential hypertension    History of kidney stones    Hyperlipidemia    Hypothyroidism    Sleep apnea    TIA (transient ischemic attack) ~ 2013    Patient Active Problem List   Diagnosis Date Noted   Chronic ear pain, right 01/27/2021   OSA (obstructive sleep apnea) 11/04/2020   Hypotension 03/30/2018   Asthma 06/14/2017   Fatigue 12/11/2015   Accelerating angina (HCC) 11/22/2015   CAD S/P percutaneous coronary angioplasty 11/22/2015   Depression 09/03/2015   Hypothyroidism 09/03/2015   Atherosclerosis of coronary artery bypass graft with angina pectoris (HCC) 09/03/2015   Essential hypertension 09/03/2015   Hyperlipidemia with target LDL less than 70 09/03/2015    Past Surgical History:  Procedure Laterality Date   ABDOMINAL HYSTERECTOMY     APPENDECTOMY     BLEPHAROPLASTY Bilateral    "uppers"   BOTOX INJECTION N/A 03/23/2022   Procedure: BOTOX INJECTION- 100 units;  Surgeon: Malen Gauze, MD;  Location: [facility];  Service: Urology;  Laterality: N/A;   CARDIAC CATHETERIZATION  11/22/2015   CARDIAC CATHETERIZATION N/A 11/22/2015   Procedure: Left Heart Cath and Cors/Grafts Angiography;  Surgeon: Marykay Lex, MD;  Location: [facility] INVASIVE CV LAB;  Service: Cardiovascular;  Laterality: N/A;   CARDIAC CATHETERIZATION N/A 11/22/2015   Procedure: Coronary Stent Intervention;  Surgeon: Marykay Lex, MD;  Location: [facility] INVASIVE CV LAB;  Service: Cardiovascular;  Laterality: N/A;   CATARACT EXTRACTION, BILATERAL Bilateral    CESAREAN SECTION  1977   CORONARY ANGIOPLASTY WITH STENT PLACEMENT  "several"   "last one was 11/2014:  DES to the posterolateral branch /notes 11/20/2015   CORONARY ARTERY BYPASS GRAFT  2003   [place]; CABG X 4   CYSTOSCOPY WITH INJECTION N/A 03/23/2022   Procedure: CYSTOSCOPY WITH INJECTION;  Surgeon: Malen Gauze, MD;  Location: [facility];  Service: Urology;  Laterality: N/A;   DILATION AND CURETTAGE OF UTERUS     KIDNEY STONE SURGERY      "opened me up"   LAPAROSCOPIC CHOLECYSTECTOMY     LEFT HEART CATH AND CORS/GRAFTS ANGIOGRAPHY N/A 01/03/2018   Procedure: LEFT HEART CATH AND CORS/GRAFTS ANGIOGRAPHY;  Surgeon: [name], Peter M, MD;  Location: [facility] INVASIVE CV LAB;  Service: Cardiovascular;  Laterality: N/A;   TONSILLECTOMY     TUBAL LIGATION      OB History   No obstetric history on file.      Home Medications    Prior to Admission medications   Medication Sig Start Date End Date Taking? Authorizing Provider  predniSONE (DELTASONE) 10 MG tablet Take 2 tablets (20 mg total) by mouth daily for 3 days, THEN 1 tablet (10 mg total) daily for 3 days. Start this after you have finished the 40mg  once daily course 2 days from now.. 06/19/23 06/25/23 Yes Carlisle Beers, FNP  acetaminophen (TYLENOL) 500 MG tablet Take 1,000 mg by mouth every 8 (eight) hours as needed for moderate pain.    [provider]  albuterol (VENTOLIN HFA) 108 (90 Base) MCG/ACT inhaler Inhale 2 puffs into the lungs every 4 (four) hours as needed. 06/16/23   Particia Nearing, PA-C  aspirin 81 MG tablet Take 81 mg by mouth at bedtime.     [provider]  bisoprolol (ZEBETA) 5 MG tablet Take 0.5 tablets (2.5 mg total) by mouth daily. 01/08/21   Jonelle Sidle, MD  isosorbide mononitrate (IMDUR) 30 MG 24 hr tablet TAKE 1 TABLET BY MOUTH TWICE DAILY 05/17/23   Jonelle Sidle, MD  levothyroxine (SYNTHROID) 75 MCG tablet TAKE 1 TABLET BY MOUTH EVERY DAY BEFORE BREAKFAST 11/04/20   Ladona Ridgel, Malena M, DO  lisinopril (ZESTRIL) 5 MG tablet Take 1 tablet (5 mg total) by mouth daily. 11/04/20   Laroy Apple M, DO  loratadine (CLARITIN) 10 MG tablet Take 10 mg by mouth daily as needed.    [provider]  nitroGLYCERIN (NITROSTAT) 0.4 MG SL tablet DISSOLVE 1 TABLET UNDER THE TONGUE EVERY 5 MINUTES AS NEEDED FOR CHEST PAIN. DO NOT EXCEED A TOTAL OF 3 DOSES IN 15 MINUTES. 06/07/23   Jonelle Sidle, MD  PARoxetine (PAXIL) 40 MG  tablet Take 1 tablet (40 mg total) by mouth daily. 11/04/20   Laroy Apple M, DO  predniSONE (DELTASONE) 20 MG tablet Take 2 tablets (40 mg total) by mouth daily with breakfast. 06/16/23   Particia Nearing, PA-C  promethazine-dextromethorphan (PROMETHAZINE-DM) 6.25-15 MG/5ML syrup Take 5 mLs by mouth 4 (four) times daily as needed. 06/16/23   Particia Nearing, PA-C  rosuvastatin (CRESTOR) 20 MG tablet Take 1 tablet (20 mg total) by mouth in the morning. 11/04/20   Ladona Ridgel, Malena M, DO  SODIUM FLUORIDE 5000 PPM 1.1 % GEL dental gel Take by mouth. 12/08/22   [provider]    Family History Family History  Problem Relation Age of Onset   Heart attack Mother  Heart disease Mother    Heart disease Brother     Social History Social History   Tobacco Use   Smoking status: Never   Smokeless tobacco: Never  Vaping Use   Vaping status: Never Used  Substance Use Topics   Alcohol use: Not Currently    Alcohol/week: 0.0 standard drinks of alcohol    Comment: 11/22/2015 "nothing since ~ 2005; occasionally had a drink w/dinner before 2005"   Drug use: No     Allergies   Patient has no known allergies.   Review of Systems Review of Systems Per HPI  Physical Exam Triage Vital Signs ED Triage Vitals  Encounter Vitals Group     BP 06/19/23 1151 92/60     Systolic BP Percentile --      Diastolic BP Percentile --      Pulse Rate 06/19/23 1151 89     Resp 06/19/23 1151 20     Temp 06/19/23 1151 97.6 F (36.4 C)     Temp Source 06/19/23 1151 Oral     SpO2 06/19/23 1151 94 %     Weight --      Height --      Head Circumference --      Peak Flow --      Pain Score 06/19/23 1153 0     Pain Loc --      Pain Education --      Exclude from Growth Chart --    No data found.  Updated Vital Signs BP 100/64 (BP Location: Left Arm)   Pulse 77   Temp 97.6 F (36.4 C) (Oral)   Resp 20   SpO2 95%   Visual Acuity Right Eye Distance:   Left Eye Distance:    Bilateral Distance:    Right Eye Near:   Left Eye Near:    Bilateral Near:     Physical Exam Vitals and nursing note reviewed.  Constitutional:      Appearance: She is ill-appearing. She is not toxic-appearing.     Comments: Seated in wheelchair in position of comfort.  HENT:     Head: Normocephalic and atraumatic.     Right Ear: Hearing, tympanic membrane, ear canal and external ear normal.     Left Ear: Hearing, tympanic membrane, ear canal and external ear normal.     Nose: Congestion present.     Mouth/Throat:     Lips: Pink.     Mouth: Mucous membranes are moist. No injury.     Tongue: No lesions. Tongue does not deviate from midline.     Palate: No mass and lesions.     Pharynx: Oropharynx is clear. Uvula midline. Posterior oropharyngeal erythema present. No pharyngeal swelling, oropharyngeal exudate or uvula swelling.     Tonsils: No tonsillar exudate or tonsillar abscesses.  Eyes:     General: Lids are normal. Vision grossly intact. Gaze aligned appropriately.     Extraocular Movements: Extraocular movements intact.     Conjunctiva/sclera: Conjunctivae normal.  Cardiovascular:     Rate and Rhythm: Normal rate and regular rhythm.     Heart sounds: Normal heart sounds, S1 normal and S2 normal.  Pulmonary:     Effort: Pulmonary effort is normal. No respiratory distress.     Breath sounds: Normal air entry. Wheezing (Wheezes heard to the bilateral lower lung fields.) and rales (Rales heard to the left lower lung field.) present. No rhonchi.     Comments: Decreased breath sounds throughout.  Deep, harsh, and  barky cough elicited with deep inspiration on exam. Chest:     Chest wall: No tenderness.  Musculoskeletal:     Cervical back: Neck supple.     Right lower leg: No edema.     Left lower leg: No edema.  Skin:    General: Skin is warm and dry.     Capillary Refill: Capillary refill takes less than 2 seconds.     Findings: No rash.  Neurological:     General: No  focal deficit present.     Mental Status: She is alert and oriented to person, place, and time. Mental status is at baseline.     Cranial Nerves: No dysarthria or facial asymmetry.  Psychiatric:        Mood and Affect: Mood normal.        Speech: Speech normal.        Behavior: Behavior normal.        Thought Content: Thought content normal.        Judgment: Judgment normal.      UC Treatments / Results  Labs (all labs ordered are listed, but only abnormal results are displayed) Labs Reviewed - No data to display  EKG   Radiology DG Chest 2 View  Result Date: 06/19/2023 CLINICAL DATA:  Cough for 1 week EXAM: CHEST - 2 VIEW COMPARISON:  06/16/2023 FINDINGS: Cardiomegaly status post median sternotomy and CABG. Both lungs are clear. The visualized skeletal structures are unremarkable. IMPRESSION: Cardiomegaly without acute abnormality of the lungs. Electronically Signed   By: Jearld Lesch M.D.   On: 06/19/2023 13:12    Procedures Procedures (including critical care time)  Medications Ordered in UC Medications  ipratropium-albuterol (DUONEB) 0.5-2.5 (3) MG/3ML nebulizer solution 3 mL (3 mLs Nebulization Given 06/19/23 1232)    Initial Impression / Assessment and Plan / UC Course  I have reviewed the triage vital signs and the nursing notes.  Pertinent labs & imaging results that were available during my care of the patient were reviewed by me and considered in my medical decision making (see chart for details).   1. Acute bronchitis, shortness of breath, acute cough Presentation consistent with acute viral bronchitis. Reviewed previous urgent care note. Patient non-toxic in appearance, vital signs hemodynamically stable, no new oxygen requirement.  Chest x-ray performed to rule out focal consolidation/pneumonia is unremarkable for signs of acute cardiopulmonary process by my interpretation and shows very similar findings to CXR performed on 06/16/2023. Will call if radiolgy  re-read shows need for change in treatment plan.  Duoneb breathing treatment significantly helped with subjective shortness of breath, cough, and lung sounds improved significantly on re-assessment.  Currently on day 3 of prednisone 40mg  burst for 5 days, will extend steroid to include longer taper to further help suppress inflammation to the lungs and aid in resolution of symptoms. Adding on 20mg  daily for 3 days, then 10mg  daily for 3 days after completing 5 day burst of 40mg  daily.  Promethazine DM as needed for cough. Continue albuterol as needed.  Counseled patient on potential for adverse effects with medications prescribed/recommended today, strict ER and return-to-clinic precautions discussed, patient verbalized understanding.   Of note, patient took nitroglycerin tablet while in the urgent care waiting room resulting in soft BP. BP improved on re-check prior to discharge. Discussed indications for nitroglycerin and calling 911 after 2 pills if 2 pills are necessary. Strict ER precautions discussed. She has a a follow-up scheduled with cardiology next week, advised to discuss use of nitroglycerin  with them further.   Final Clinical Impressions(s) / UC Diagnoses   Final diagnoses:  Acute bronchitis, unspecified organism  Shortness of breath  Acute cough     Discharge Instructions      I will call you if the chest x-ray shows any abnormal findings indicating a pneumonia. No news is good news. I would like to extend your prednisone steroid course. Continue taking prednisone 40 mg once daily for the next 2 days to finish out the course that Roosvelt Maser prescribed for you. Then start taking 20 mg once daily for 3 days, then 10 mg once daily for 3 days. Continue using albuterol inhaler as needed for cough, shortness of breath, and wheeze. Continue other medications as well.  If your symptoms do not improve in the next 1 to 2 days with use of these medications, I would like for you  to go to the nearest emergency department for further evaluation.     ED Prescriptions     Medication Sig Dispense Auth. Provider   predniSONE (DELTASONE) 10 MG tablet Take 2 tablets (20 mg total) by mouth daily for 3 days, THEN 1 tablet (10 mg total) daily for 3 days. Start this after you have finished the 40mg  once daily course 2 days from now.. 15 tablet Carlisle Beers, FNP      PDMP not reviewed this encounter.   Carlisle Beers, FNP 06/19/23 2057    Carlisle Beers, FNP 06/19/23 [phone]

## 2023-06-22 ENCOUNTER — Ambulatory Visit: Payer: PPO | Admitting: Nurse Practitioner

## 2023-06-28 ENCOUNTER — Encounter: Payer: Self-pay | Admitting: Family Medicine

## 2023-06-28 ENCOUNTER — Ambulatory Visit (INDEPENDENT_AMBULATORY_CARE_PROVIDER_SITE_OTHER): Payer: PPO | Admitting: Family Medicine

## 2023-06-28 VITALS — BP 118/58 | HR 67 | Temp 96.4°F | Ht 62.0 in | Wt 222.0 lb

## 2023-06-28 DIAGNOSIS — R7309 Other abnormal glucose: Secondary | ICD-10-CM

## 2023-06-28 DIAGNOSIS — E785 Hyperlipidemia, unspecified: Secondary | ICD-10-CM | POA: Diagnosis not present

## 2023-06-28 DIAGNOSIS — Z951 Presence of aortocoronary bypass graft: Secondary | ICD-10-CM | POA: Insufficient documentation

## 2023-06-28 DIAGNOSIS — I25118 Atherosclerotic heart disease of native coronary artery with other forms of angina pectoris: Secondary | ICD-10-CM

## 2023-06-28 DIAGNOSIS — J209 Acute bronchitis, unspecified: Secondary | ICD-10-CM | POA: Insufficient documentation

## 2023-06-28 DIAGNOSIS — I2510 Atherosclerotic heart disease of native coronary artery without angina pectoris: Secondary | ICD-10-CM | POA: Insufficient documentation

## 2023-06-28 DIAGNOSIS — N1832 Chronic kidney disease, stage 3b: Secondary | ICD-10-CM

## 2023-06-28 MED ORDER — DOXYCYCLINE HYCLATE 100 MG PO TABS: 100.0000 mg | ORAL_TABLET | Freq: Two times a day (BID) | ORAL | 0 refills | Status: DC

## 2023-06-28 MED ORDER — BENZONATATE 200 MG PO CAPS: 200.0000 mg | ORAL_CAPSULE | Freq: Three times a day (TID) | ORAL | 0 refills | Status: DC | PRN

## 2023-06-28 MED ORDER — AMOXICILLIN-POT CLAVULANATE 875-125 MG PO TABS: 1.0000 | ORAL_TABLET | Freq: Two times a day (BID) | ORAL | 0 refills | Status: DC

## 2023-06-28 NOTE — Progress Notes (Signed)
Subjective:  Patient ID: Lacey Bailey, female    DOB: 15-Mar-1946  Age: 77 y.o. MRN: [id number]  CC:   Chief Complaint  Patient presents with   Establish Care   Cough    10 days - productive -had 2 xrays results in the system     HPI:  77 year old female presents for evaluation of the above.  Patient is new to our practice.  Patient has underlying coronary artery disease and is status post CABG.  Also has OSA, hyperlipidemia, hypertension, hypothyroidism.  Patient reports that she has been sick for past 2 weeks.  She reports persistent cough.  Has not improved with treatment.  She has been prescribed prednisone and Promethazine DM.  She has had 2 recent chest x-rays.  No evidence of pneumonia.  Cardiomegaly was noted.  Of note, patient has not had an echocardiogram since 2018.  Patient reports baseline shortness of breath particular with exertion.  She denies PND/orthopnea.  She states that she is having to sit up due to her frequent coughing.   Patient Active Problem List   Diagnosis Date Noted   S/P CABG (coronary artery bypass graft) 06/28/2023   CAD (coronary artery disease) 06/28/2023   Acute bronchitis 06/28/2023   OSA (obstructive sleep apnea) 11/04/2020   Depression 09/03/2015   Hypothyroidism 09/03/2015   Essential hypertension 09/03/2015   Hyperlipidemia with target LDL less than 70 09/03/2015    Social Hx   Social History   Socioeconomic History   Marital status: Married    Spouse name: Not on file   Number of children: Not on file   Years of education: Not on file   Highest education level: Not on file  Occupational History   Not on file  Tobacco Use   Smoking status: Never   Smokeless tobacco: Never  Vaping Use   Vaping status: Never Used  Substance and Sexual Activity   Alcohol use: Not Currently    Alcohol/week: 0.0 standard drinks of alcohol    Comment: 11/22/2015 "nothing since ~ 2005; occasionally had a drink w/dinner before 2005"   Drug use:  No   Sexual activity: Not Currently  Other Topics Concern   Not on file  Social History Narrative   Not on file   Social Determinants of Health   Financial Resource Strain: Not on file  Food Insecurity: Not on file  Transportation Needs: Not on file  Physical Activity: Not on file  Stress: Not on file  Social Connections: Not on file    Review of Systems Per HPI  Objective:  BP (!) 118/58   Pulse 67   Temp (!) 96.4 F (35.8 C)   Ht 5\' 2"  (1.575 m)   Wt 222 lb (100.7 kg)   SpO2 97%   BMI 40.60 kg/m      06/28/2023    3:30 PM 06/19/2023   12:55 PM 06/19/2023   11:51 AM  BP/Weight  Systolic BP 118 100 92  Diastolic BP 58 64 60  Wt. (Lbs) 222    BMI 40.6 kg/m2      Physical Exam Vitals and nursing note reviewed.  Constitutional:      General: She is not in acute distress.    Appearance: Normal appearance.  HENT:     Head: Normocephalic and atraumatic.  Eyes:     General:        Right eye: No discharge.        Left eye: No discharge.  Conjunctiva/sclera: Conjunctivae normal.  Cardiovascular:     Rate and Rhythm: Normal rate and regular rhythm.  Pulmonary:     Effort: Pulmonary effort is normal.     Breath sounds: Normal breath sounds. No wheezing, rhonchi or rales.  Neurological:     Mental Status: She is alert.     Lab Results  Component Value Date   WBC 6.7 05/17/2020   HGB 13.9 05/17/2020   HCT 41.3 05/17/2020   PLT 321 05/17/2020   GLUCOSE 131 (H) 03/18/2022   CHOL 127 05/17/2020   TRIG 157 (H) 05/17/2020   HDL 37 (L) 05/17/2020   LDLCALC 63 05/17/2020   ALT 13 05/17/2020   AST 15 05/17/2020   NA 140 03/18/2022   K 4.2 03/18/2022   CL 107 03/18/2022   CREATININE 1.35 (H) 03/18/2022   BUN 21 03/18/2022   CO2 25 03/18/2022   TSH 2.460 05/17/2020   INR 0.96 01/28/2023     Assessment & Plan:   Problem List Items Addressed This Visit       Cardiovascular and Mediastinum   CAD (coronary artery disease)   Relevant Orders    ECHOCARDIOGRAM COMPLETE     Respiratory   Acute bronchitis    Persistent symptoms and lack of improvement, placing on doxycycline.  Tessalon Perles for cough.        Other   Hyperlipidemia with target LDL less than 70 (Chronic)   Relevant Orders   Lipid panel   S/P CABG (coronary artery bypass graft) - Primary   Relevant Orders   ECHOCARDIOGRAM COMPLETE   Other Visit Diagnoses     Stage 3b chronic kidney disease (HCC)       Relevant Orders   CBC   CMP14+EGFR   Elevated glucose       Relevant Orders   Hemoglobin A1c       Meds ordered this encounter  Medications   DISCONTD: amoxicillin-clavulanate (AUGMENTIN) 875-125 MG tablet    Sig: Take 1 tablet by mouth 2 (two) times daily.    Dispense:  20 tablet    Refill:  0   benzonatate (TESSALON) 200 MG capsule    Sig: Take 1 capsule (200 mg total) by mouth 3 (three) times daily as needed for cough.    Dispense:  30 capsule    Refill:  0   doxycycline (VIBRA-TABS) 100 MG tablet    Sig: Take 1 tablet (100 mg total) by mouth 2 (two) times daily.    Dispense:  14 tablet    Refill:  0    Please discontinue Augmentin Rx. I am using Doxy instead. Thank you    Follow-up:  Return in about 3 months (around 09/28/2023) for Follow up Chronic medical issues.  Everlene Other DO [facility]

## 2023-06-28 NOTE — Assessment & Plan Note (Signed)
Persistent symptoms and lack of improvement, placing on doxycycline.  Tessalon Perles for cough.

## 2023-06-28 NOTE — Patient Instructions (Signed)
Labs once you're feeling better.  I went ahead and ordered the Echo.  Medications as prescribed.  Follow up in 3 months or sooner if needed.

## 2023-06-30 ENCOUNTER — Ambulatory Visit: Payer: PPO | Attending: Nurse Practitioner | Admitting: Cardiology

## 2023-06-30 ENCOUNTER — Encounter: Payer: Self-pay | Admitting: Cardiology

## 2023-06-30 VITALS — BP 122/74 | HR 70 | Ht 62.0 in | Wt 224.6 lb

## 2023-06-30 DIAGNOSIS — E782 Mixed hyperlipidemia: Secondary | ICD-10-CM

## 2023-06-30 DIAGNOSIS — R0602 Shortness of breath: Secondary | ICD-10-CM

## 2023-06-30 DIAGNOSIS — I25119 Atherosclerotic heart disease of native coronary artery with unspecified angina pectoris: Secondary | ICD-10-CM

## 2023-06-30 DIAGNOSIS — I517 Cardiomegaly: Secondary | ICD-10-CM

## 2023-06-30 NOTE — Progress Notes (Signed)
    Cardiology Office Note  Date: 06/30/2023   ID: [name], [name] 16-Dec-1945, MRN [id number]  History of Present Illness: Lacey Bailey is a 77 y.o. female last seen in June.  She is here with her husband for a follow-up visit.  Reports URI symptoms over the last few weeks, initially cough that was intermittently productive, general malaise, shortness of breath and fatigue more recently.  She has been seen in urgent care and just recently by her PCP.  Chest x-ray from October 26 indicated cardiomegaly without acute process.  She took a course of steroids, just recently started on benzonatate and doxycycline by PCP.  There had been some discussion of getting an echocardiogram as well, although I do not see that this was scheduled.  I reviewed her medications.  Current cardiac regimen includes aspirin, bisoprolol, Imdur, lisinopril, Crestor, and as needed nitroglycerin.  He does not report any recent increasing nitroglycerin use.  Physical Exam: VS:  BP 122/74 (BP Location: Left Arm)   Pulse 70   Ht 5\' 2"  (1.575 m)   Wt 224 lb 9.6 oz (101.9 kg)   SpO2 98%   BMI 41.08 kg/m , BMI Body mass index is 41.08 kg/m.  Wt Readings from Last 3 Encounters:  06/30/23 224 lb 9.6 oz (101.9 kg)  06/28/23 222 lb (100.7 kg)  03/18/23 219 lb 9.6 oz (99.6 kg)    General: Patient appears comfortable at rest. HEENT: Conjunctiva and lids normal. Neck: Supple, no elevated JVP or carotid bruits. Lungs: Clear to auscultation, nonlabored breathing at rest. Cardiac: RRR, 2/6 systolic murmur, no gallop. Extremities: No pitting edema.  ECG:  An ECG dated 01/28/2023 was personally reviewed today and demonstrated:  Sinus rhythm with nonspecific ST-T changes.  Labwork:  June 2024: Cholesterol 105, triglycerides 122, HDL 33, LDL 48, hemoglobin 13, platelets 301, hemoglobin A1c 5.5%,, potassium 3.8, BUN 17, creatinine 1.24   Other Studies Reviewed Today:  No interval cardiac testing for review  today.  Assessment and Plan:  1.  Recent URI symptoms as discussed above.  Chest x-ray does indicate cardiomegaly, but no infiltrates.  Recently prescribed cough suppressant and doxycycline by PCP and she also completed a course of steroids.  We will go ahead and obtain an echocardiogram mainly to exclude any associated cardiomyopathy that would necessitate change in her cardiovascular regimen.  2.  Multivessel CAD status post CABG in 2003 and multiple PCI's at outside hospitals.  She underwent DES to the PLB in 2016, angioplasty of the mid RCA and mid circumflex in 2017 with documentation of patent LIMA to LAD and occlusion of all vein grafts at that time.  Most recent angiography in 2019 showed continued patency of the LIMA to LAD with occlusion of all vein grafts as well as native circumflex and RCA.  Redo CABG was not recommended at that time and revascularization options are limited.  Current cardiac regimen includes aspirin, bisoprolol, Imdur, Crestor, and as needed nitroglycerin.   3.  Mixed hyperlipidemia, LDL 48 in June of this year on Crestor.   4.  Primary hypertension.  Blood pressure well-controlled.  Continue lisinopril.  Disposition:  Follow up  6 months.  Signed, Jonelle Sidle, M.D., F.A.C.C. [facility]

## 2023-06-30 NOTE — Patient Instructions (Addendum)

## 2023-07-14 ENCOUNTER — Other Ambulatory Visit: Payer: PPO | Admitting: Urology

## 2023-07-15 ENCOUNTER — Ambulatory Visit: Payer: PPO | Attending: Cardiology

## 2023-07-15 DIAGNOSIS — R0602 Shortness of breath: Secondary | ICD-10-CM | POA: Diagnosis not present

## 2023-07-15 DIAGNOSIS — I517 Cardiomegaly: Secondary | ICD-10-CM | POA: Diagnosis not present

## 2023-07-19 LAB — ECHOCARDIOGRAM COMPLETE
AR max vel: 1.21 cm2
AV Area VTI: 1.22 cm2
AV Area mean vel: 1.17 cm2
AV Mean grad: 14 mm[Hg]
AV Peak grad: 22.4 mm[Hg]
Ao pk vel: 2.37 m/s
Area-P 1/2: 2 cm2
Calc EF: 57.2 %
MV VTI: 2.33 cm2
S' Lateral: 3.3 cm
Single Plane A2C EF: 60.6 %
Single Plane A4C EF: 50.5 %

## 2023-08-05 ENCOUNTER — Other Ambulatory Visit: Payer: Self-pay | Admitting: Family Medicine

## 2023-08-23 DIAGNOSIS — N1832 Chronic kidney disease, stage 3b: Secondary | ICD-10-CM | POA: Diagnosis not present

## 2023-08-23 DIAGNOSIS — E785 Hyperlipidemia, unspecified: Secondary | ICD-10-CM | POA: Diagnosis not present

## 2023-08-23 DIAGNOSIS — R7309 Other abnormal glucose: Secondary | ICD-10-CM | POA: Diagnosis not present

## 2023-08-24 LAB — CMP14+EGFR
ALT: 12 [IU]/L (ref 0–32)
AST: 22 [IU]/L (ref 0–40)
Albumin: 4.1 g/dL (ref 3.8–4.8)
Alkaline Phosphatase: 90 [IU]/L (ref 44–121)
BUN/Creatinine Ratio: 12 (ref 12–28)
BUN: 13 mg/dL (ref 8–27)
Bilirubin Total: 0.7 mg/dL (ref 0.0–1.2)
CO2: 22 mmol/L (ref 20–29)
Calcium: 9.8 mg/dL (ref 8.7–10.3)
Chloride: 104 mmol/L (ref 96–106)
Creatinine, Ser: 1.06 mg/dL — ABNORMAL HIGH (ref 0.57–1.00)
Globulin, Total: 2.5 g/dL (ref 1.5–4.5)
Glucose: 116 mg/dL — ABNORMAL HIGH (ref 70–99)
Potassium: 4.8 mmol/L (ref 3.5–5.2)
Sodium: 142 mmol/L (ref 134–144)
Total Protein: 6.6 g/dL (ref 6.0–8.5)
eGFR: 53 mL/min/{1.73_m2} — ABNORMAL LOW (ref >59)

## 2023-08-24 LAB — LIPID PANEL
Chol/HDL Ratio: 3.4 {ratio} (ref 0.0–4.4)
Cholesterol, Total: 124 mg/dL (ref 100–199)
HDL: 36 mg/dL — ABNORMAL LOW (ref >39)
LDL Chol Calc (NIH): 58 mg/dL (ref 0–99)
Triglycerides: 180 mg/dL — ABNORMAL HIGH (ref 0–149)
VLDL Cholesterol Cal: 30 mg/dL (ref 5–40)

## 2023-08-24 LAB — CBC
Hematocrit: 39.4 % (ref 34.0–46.6)
Hemoglobin: 13 g/dL (ref 11.1–15.9)
MCH: 31 pg (ref 26.6–33.0)
MCHC: 33 g/dL (ref 31.5–35.7)
MCV: 94 fL (ref 79–97)
Platelets: 372 10*3/uL (ref 150–450)
RBC: 4.19 x10E6/uL (ref 3.77–5.28)
RDW: 13.7 % (ref 11.7–15.4)
WBC: 9.5 10*3/uL (ref 3.4–10.8)

## 2023-08-24 LAB — HEMOGLOBIN A1C
Est. average glucose Bld gHb Est-mCnc: 120 mg/dL
Hgb A1c MFr Bld: 5.8 % — ABNORMAL HIGH (ref 4.8–5.6)

## 2023-08-30 ENCOUNTER — Telehealth: Payer: Self-pay

## 2023-08-30 NOTE — Telephone Encounter (Signed)
 Left message for patient to return the call for additional details and recommendations of her lab results

## 2023-09-17 DIAGNOSIS — I25709 Atherosclerosis of coronary artery bypass graft(s), unspecified, with unspecified angina pectoris: Secondary | ICD-10-CM | POA: Diagnosis not present

## 2023-09-17 DIAGNOSIS — G459 Transient cerebral ischemic attack, unspecified: Secondary | ICD-10-CM | POA: Diagnosis not present

## 2023-09-17 DIAGNOSIS — I10 Essential (primary) hypertension: Secondary | ICD-10-CM | POA: Diagnosis not present

## 2023-09-17 DIAGNOSIS — E782 Mixed hyperlipidemia: Secondary | ICD-10-CM | POA: Diagnosis not present

## 2023-09-17 DIAGNOSIS — E039 Hypothyroidism, unspecified: Secondary | ICD-10-CM | POA: Diagnosis not present

## 2023-09-17 DIAGNOSIS — R54 Age-related physical debility: Secondary | ICD-10-CM | POA: Diagnosis not present

## 2023-09-20 ENCOUNTER — Other Ambulatory Visit: Payer: PPO | Admitting: Urology

## 2023-09-29 ENCOUNTER — Other Ambulatory Visit: Payer: Self-pay | Admitting: Cardiology

## 2023-10-13 ENCOUNTER — Ambulatory Visit: Payer: PPO | Admitting: Urology

## 2023-10-13 VITALS — BP 110/66 | HR 73

## 2023-10-13 DIAGNOSIS — N3281 Overactive bladder: Secondary | ICD-10-CM | POA: Diagnosis not present

## 2023-10-13 LAB — URINALYSIS, ROUTINE W REFLEX MICROSCOPIC
Bilirubin, UA: NEGATIVE
Glucose, UA: NEGATIVE
Ketones, UA: NEGATIVE
Nitrite, UA: NEGATIVE
Protein,UA: NEGATIVE
RBC, UA: NEGATIVE
Specific Gravity, UA: 1.025 (ref 1.005–1.030)
Urobilinogen, Ur: 1 mg/dL (ref 0.2–1.0)
pH, UA: 6 (ref 5.0–7.5)

## 2023-10-13 LAB — MICROSCOPIC EXAMINATION

## 2023-10-13 MED ORDER — LIDOCAINE HCL 1 % IJ SOLN
50.0000 mL | Freq: Once | INTRAMUSCULAR | Status: AC
  Administered 2023-10-13: 50 mL

## 2023-10-13 MED ORDER — ONABOTULINUMTOXINA 100 UNITS IJ SOLR
100.0000 [IU] | Freq: Once | INTRAMUSCULAR | Status: AC
  Administered 2023-10-13: 100 [IU] via INTRAMUSCULAR

## 2023-10-13 MED ORDER — CIPROFLOXACIN HCL 500 MG PO TABS
500.0000 mg | ORAL_TABLET | Freq: Once | ORAL | Status: AC
  Administered 2023-10-13: 500 mg via ORAL

## 2023-10-13 NOTE — Progress Notes (Signed)
  Bladder Instillation DMSO  Due to bladder botox patient is present today for a Bladder Instillation of . Patient was cleaned and prepped in a sterile fashion with Betadinex3.  A 14FR catheter was inserted, urine return was noted 15ml, urine was Clear yellow In color. 50 cc of 1% lidocaine was instilled into the bladder. After 30 minutes the catheter was then removed. Patient tolerated well, no complications were noted.   Preformed by: Guss Bunde, CMA  Follow up/ Additional notes: Urine sent for routine urinalysis - MD to see after for bladder botox procedure

## 2023-10-19 ENCOUNTER — Encounter: Payer: Self-pay | Admitting: Urology

## 2023-10-19 NOTE — Patient Instructions (Signed)

## 2023-10-19 NOTE — Progress Notes (Signed)
 Intraservice. Urojet was instilled using sterile technique into the urethra without difficulty.  Anesthesia: 1% plain lidocaine 50 cc instilled via careful sterile placement of a foley catheter. The bladder was allowed to achieve anesthesia via gently rolling the patient side to side for 15 minutes. The catheter was removed after the instillation. TIME ELEMENT = 15 MINUTES  Prep: BOTOX(R) (Allergan, [place], [place], 100 Units per vial) was reconstituted gently with 10 mL of 0.9% sterile, nonpreserved saline solution and drawn into a 10-mL syringe. An additional 1 mL of sterile, nonpreserved saline solution was drawn into a separate syringe for the final injection so that the remaining BOTOX(R) in the needle was delivered to the bladder. TIME ELEMENT = 15 MINUTES  Procedure: A rigid cystoscope was gently inserted into the urinary bladder via the urethra. The trigone was identified and evaluated. Next we identified 20 template sites within the urinary bladder. The bladder is only instilled with 100 cc volume to ensure adequate muscle wall thickness to prevent needle penetration beyond the muscle wall. Careful injections were undertaken for a total of 20 injections using a Laborie depth-limiting needle [employer] [place], [place]). 0.5 cc volume was delivered at each site carefully into the muscle without excessive bleb formation submucosally. Targeted zones were injected over TIME ELEMENT =15 MINUTES.  Postservice: The patient was monitored in recovery for 30 minutes. Patient voided successfully. Clean intermittent catheterization training was reviewed again for the patient. Respiratory and cardiac status were confirmed for stability. Mentation is assessed to be adequate for discharge with alteration in status. TIME ELEMENT = 30 MINUTES. TOTAL PROCEDURE TIME: 1 HR AND 10 MINUTES  Qty: 100 Unit: kit Route: Intra-vesically Freq: None Site: None Mfgr: Allergan  Patient was instructed to  call the office immediately for bloody urine, difficulty urinating, urinary retention, painful or frequent urination, fever, chills, nausea, vomiting, or other illness. The patient stated that she understood these instructions and would comply with them. Patient was provided prophylactic antibiotics (except aminoglycosides) for 1 to 3 days postprocedure.

## 2023-10-21 DIAGNOSIS — E782 Mixed hyperlipidemia: Secondary | ICD-10-CM | POA: Diagnosis not present

## 2023-10-21 DIAGNOSIS — I10 Essential (primary) hypertension: Secondary | ICD-10-CM | POA: Diagnosis not present

## 2023-10-21 DIAGNOSIS — R54 Age-related physical debility: Secondary | ICD-10-CM | POA: Diagnosis not present

## 2023-10-21 DIAGNOSIS — I25709 Atherosclerosis of coronary artery bypass graft(s), unspecified, with unspecified angina pectoris: Secondary | ICD-10-CM | POA: Diagnosis not present

## 2023-10-21 DIAGNOSIS — E039 Hypothyroidism, unspecified: Secondary | ICD-10-CM | POA: Diagnosis not present

## 2023-10-22 ENCOUNTER — Ambulatory Visit: Payer: PPO

## 2023-11-22 DIAGNOSIS — G4733 Obstructive sleep apnea (adult) (pediatric): Secondary | ICD-10-CM | POA: Diagnosis not present

## 2024-01-29 ENCOUNTER — Other Ambulatory Visit: Payer: Self-pay | Admitting: Cardiology

## 2024-02-22 ENCOUNTER — Other Ambulatory Visit: Payer: Self-pay | Admitting: Cardiology

## 2024-03-15 ENCOUNTER — Encounter: Payer: Self-pay | Admitting: Cardiology

## 2024-03-15 ENCOUNTER — Ambulatory Visit: Attending: Cardiology | Admitting: Cardiology

## 2024-03-15 VITALS — BP 122/78 | HR 70 | Ht 62.5 in | Wt 220.4 lb

## 2024-03-15 DIAGNOSIS — E782 Mixed hyperlipidemia: Secondary | ICD-10-CM | POA: Diagnosis not present

## 2024-03-15 DIAGNOSIS — I25119 Atherosclerotic heart disease of native coronary artery with unspecified angina pectoris: Secondary | ICD-10-CM | POA: Diagnosis not present

## 2024-03-15 DIAGNOSIS — I350 Nonrheumatic aortic (valve) stenosis: Secondary | ICD-10-CM | POA: Diagnosis not present

## 2024-03-15 DIAGNOSIS — I10 Essential (primary) hypertension: Secondary | ICD-10-CM | POA: Diagnosis not present

## 2024-03-15 NOTE — Progress Notes (Signed)
 Cardiology Office Note  Date: 03/15/2024   ID: Lacey Bailey [birth year], MRN [id number]  History of Present Illness: Lacey Bailey is a 78 y.o. female last seen in November 2024.  She is here with her husband for a follow-up visit.  Reports no accelerating angina or nitroglycerin  requirement with typical activities.  Has good days and bad days in terms of energy and stamina.  She has had no palpitations, no sudden dizziness or syncope.  We went over her medications.  She reports compliance with cardiac regimen which is unchanged.  I reviewed her interval lab work.  LDL was 58 in January.  Blood pressure is well-controlled today.  I reviewed her ECG today which shows sinus rhythm with LVH and repolarization abnormalities.  We also discussed her echocardiogram from November 2024.  Physical Exam: VS:  BP 122/78 (BP Location: Left Arm)   Pulse 70   Ht 5' 2.5 (1.588 m)   Wt 220 lb 6.4 oz (100 kg)   SpO2 97%   BMI 39.67 kg/m , BMI Body mass index is 39.67 kg/m.  Wt Readings from Last 3 Encounters:  03/15/24 220 lb 6.4 oz (100 kg)  06/30/23 224 lb 9.6 oz (101.9 kg)  06/28/23 222 lb (100.7 kg)    General: Patient appears comfortable at rest. HEENT: Conjunctiva and lids normal. Neck: Supple, no elevated JVP or carotid bruits. Lungs: Clear to auscultation, nonlabored breathing at rest. Cardiac: Regular rate and rhythm, no S3, 2/6 systolic murmur, no pericardial rub.  ECG:  An ECG dated 01/28/2023 was personally reviewed today and demonstrated:  Sinus rhythm with nonspecific ST-T changes.  Labwork: 08/23/2023: ALT 12; AST 22; BUN 13; Creatinine, Ser 1.06; Hemoglobin 13.0; Platelets 372; Potassium 4.8; Sodium 142     Component Value Date/Time   CHOL 124 08/23/2023 1444   TRIG 180 (H) 08/23/2023 1444   HDL 36 (L) 08/23/2023 1444   CHOLHDL 3.4 08/23/2023 1444   CHOLHDL 6.0 04/20/2017 1100   VLDL 69 (H) 04/20/2017 1100   LDLCALC 58 08/23/2023 1444   Other Studies Reviewed  Today:  No interval cardiac testing for review today.  Assessment and Plan:  1.  Multivessel CAD status post CABG in 2003 and multiple PCI's at outside hospitals.  She underwent DES to the PLB in 2016, angioplasty of the mid RCA and mid circumflex in 2017 with documentation of patent LIMA to LAD and occlusion of all vein grafts at that time.  Most recent angiography in 2019 showed continued patency of the LIMA to LAD with occlusion of all vein grafts as well as native circumflex and RCA.  Redo CABG was not recommended at that time and revascularization options are limited.  Follow-up echocardiogram in November 2024 revealed normal LVEF at 60 to 65%.  No accelerating angina as discussed above.  Plan to continue medical therapy, pacing herself with activity as tolerated.  Currently on aspirin  81 mg daily, bisoprolol  2.5 mg daily, Imdur  30 mg twice daily, Crestor  20 mg daily, and as needed nitroglycerin .  2.  Aortic stenosis, moderate paradoxical normal flow/low gradient with mean AV gradient 14 mmHg and dimensionless index 0.39 by echocardiogram in November 2024.  Unlikely to be contributing to active symptoms at this time.   3.  Mixed hyperlipidemia, LDL 58 in January 2024.  Continue Crestor  20 mg daily.   4.  Primary hypertension.  Blood pressure well-controlled today.  Also on lisinopril  5 mg daily.  Disposition:  Follow up 6 months.  Signed,  Jayson JUDITHANN Sierras, M.D., F.A.C.C. [facility]

## 2024-03-15 NOTE — Patient Instructions (Addendum)

## 2024-04-12 ENCOUNTER — Ambulatory Visit: Payer: PPO | Admitting: Urology

## 2024-04-12 VITALS — BP 107/73 | HR 75

## 2024-04-12 DIAGNOSIS — N3281 Overactive bladder: Secondary | ICD-10-CM | POA: Diagnosis not present

## 2024-04-12 LAB — URINALYSIS, ROUTINE W REFLEX MICROSCOPIC
Bilirubin, UA: NEGATIVE
Ketones, UA: NEGATIVE
Nitrite, UA: NEGATIVE
RBC, UA: NEGATIVE
Specific Gravity, UA: 1.03 (ref 1.005–1.030)
Urobilinogen, Ur: 1 mg/dL (ref 0.2–1.0)
pH, UA: 5.5 (ref 5.0–7.5)

## 2024-04-12 LAB — MICROSCOPIC EXAMINATION

## 2024-04-12 MED ORDER — LIDOCAINE HCL 1 % IJ SOLN
50.0000 mg | Freq: Once | INTRAMUSCULAR | Status: AC
  Administered 2024-04-12: 50 mg

## 2024-04-12 MED ORDER — DIAZEPAM 10 MG PO TABS
10.0000 mg | ORAL_TABLET | Freq: Once | ORAL | 0 refills | Status: AC
Start: 2024-04-12 — End: 2024-04-12

## 2024-04-12 MED ORDER — CIPROFLOXACIN HCL 500 MG PO TABS
500.0000 mg | ORAL_TABLET | Freq: Once | ORAL | Status: AC
  Administered 2024-04-12: 500 mg via ORAL

## 2024-04-12 MED ORDER — ONABOTULINUMTOXINA 100 UNITS IJ SOLR
100.0000 [IU] | Freq: Once | INTRAMUSCULAR | Status: AC
  Administered 2024-04-12: 100 [IU] via INTRAMUSCULAR

## 2024-04-12 NOTE — Progress Notes (Signed)
 Intraservice. Urojet was instilled using sterile technique into the urethra without difficulty.  Anesthesia: 1% plain lidocaine  50 cc instilled via careful sterile placement of a foley catheter. The bladder was allowed to achieve anesthesia via gently rolling the patient side to side for 15 minutes. The catheter was removed after the instillation. TIME ELEMENT = 15 MINUTES  Prep: BOTOX (R) (Allergan, [place], [place], 100 Units per vial) was reconstituted gently with 10 mL of 0.9% sterile, nonpreserved saline solution and drawn into a 10-mL syringe. An additional 1 mL of sterile, nonpreserved saline solution was drawn into a separate syringe for the final injection so that the remaining BOTOX (R) in the needle was delivered to the bladder. TIME ELEMENT = 15 MINUTES  Procedure: A rigid cystoscope was gently inserted into the urinary bladder via the urethra. The trigone was identified and evaluated. Next we identified 20 template sites within the urinary bladder. The bladder is only instilled with 100 cc volume to ensure adequate muscle wall thickness to prevent needle penetration beyond the muscle wall. Careful injections were undertaken for a total of 20 injections using a Laborie depth-limiting needle [employer] [place], [place]). 0.5 cc volume was delivered at each site carefully into the muscle without excessive bleb formation submucosally. Targeted zones were injected over TIME ELEMENT =15 MINUTES.  Postservice: The patient was monitored in recovery for 30 minutes. Patient voided successfully. Clean intermittent catheterization training was reviewed again for the patient. Respiratory and cardiac status were confirmed for stability. Mentation is assessed to be adequate for discharge with alteration in status. TIME ELEMENT = 30 MINUTES. TOTAL PROCEDURE TIME: 1 HR AND 10 MINUTES  Qty: 100 Unit: kit Route: Intra-vesically Freq: None Site: None   Patient was instructed to call the  office immediately for bloody urine, difficulty urinating, urinary retention, painful or frequent urination, fever, chills, nausea, vomiting, or other illness. The patient stated that she understood these instructions and would comply with them. Patient was provided prophylactic antibiotics (except aminoglycosides) for 1 to 3 days postprocedure.

## 2024-04-12 NOTE — Progress Notes (Signed)
    Bladder Instillation for Botox  procedure  Due to OAB patient is present today for Botox . A Bladder Instillation of 1% lidocaine  was given. Patient was cleaned and prepped in a sterile fashion with Betadinex3.  A 14 FR catheter was inserted, urine return was noted 2ml, urine was  Dark yellow in color.  50 ml was instilled into the bladder. Patient tolerated well, no complications were noted. Patient held in bladder for 30 minutes prior to procedure starting.  MD was notified of bladder instillation time.  Performed by: Exie DASEN. CMA

## 2024-04-18 ENCOUNTER — Encounter: Payer: Self-pay | Admitting: Urology

## 2024-04-18 NOTE — Patient Instructions (Signed)
 Botulinum Toxin Bladder Injection  A botulinum toxin bladder injection is a procedure to treat an overactive bladder. During the procedure, a drug called botulinum toxin is injected into the bladder through a long, thin needle. This drug relaxes the bladder muscles and reduces overactivity. You may need this procedure if your medicines are not working or you cannot take them. The procedure may be repeated as needed. The treatment is done once and it usually lasts for 6 months. Your health care provider will monitor you to see how well you respond. Tell a health care provider about: Any allergies you have. All medicines you are taking, including vitamins, herbs, eye drops, creams, and over-the-counter medicines. Any problems you or family members have had with anesthetic medicines. Any bleeding problems you have. Any surgeries you have had. Any medical conditions you have. Any previous reactions to a botulinum toxin injection. Any symptoms of urinary tract infection. These include chills, fever, a burning feeling when passing urine, and needing to pass urine often. Whether you are pregnant or may be pregnant. What are the risks? Generally this is a safe procedure. However, problems may occur, including: Not being able to pass urine. If this happens, you may need to have your bladder emptied with a thin tube (urinary catheter). Bleeding. Urinary tract infection. Allergic reaction to the botulinum toxin. Pain or burning when passing urine. Damage to nearby structures or organs. What happens before the procedure? When to stop eating and drinking Follow instructions from your health care provider about what you may eat and drink before your procedure. These may include: 8 hours before the procedure Stop eating most foods. Do not eat meat, fried foods, or fatty foods. Eat only light foods, such as toast or crackers. All liquids are okay except energy drinks and alcohol. 6 hours before the  procedure Stop eating. Drink only clear liquids, such as water, clear fruit juice, black coffee, plain tea, and sports drinks. Do not drink energy drinks or alcohol. 2 hours before the procedure Stop drinking all liquids. You may be allowed to take medicines with small sips of water. If you do not follow your health care provider's instructions, your procedure may be delayed or canceled. Medicines Ask your health care provider about: Changing or stopping your regular medicines. This is especially important if you are taking diabetes medicines or blood thinners. Taking medicines such as aspirin and ibuprofen. These medicines can thin your blood. Do not take these medicines unless your health care provider tells you to take them. Taking over-the-counter medicines, vitamins, herbs, and supplements. General instructions Ask your health care provider what steps will be taken to help prevent infection. These steps may include: Removing hair at the procedure site. Washing skin with a germ-killing soap. Taking antibiotic medicine. If you will be going home right after the procedure, plan to have a responsible adult: Take you home from the hospital or clinic. You will not be allowed to drive. Care for you for the time you are told. What happens during the procedure?  You will be asked to empty your bladder. An IV will be inserted into one of your veins. You will be given one or more of the following: A medicine to help you relax (sedative). A medicine to numb the area (local anesthetic). A medicine to make you fall asleep (general anesthetic). A long, thin scope called a cystoscope will be passed into your bladder through the part of the body that carries urine from your bladder (urethra). The cystoscope  will be used to fill your bladder with water. A long needle will be passed through the cystoscope and into the bladder. The botulinum toxin will be injected into your bladder. It may be  injected into multiple areas of your bladder. The cystoscope will be removed and your bladder will be emptied with a urinary catheter. The procedure may vary among health care providers and hospitals. What can I expect after the procedure? After your procedure, it is common to have: Blood-tinged urine. Burning or soreness when you pass urine. Follow these instructions at home: Medicines Take over-the-counter and prescription medicines only as told by your health care provider. If you were prescribed an antibiotic medicine, take it as told by your health care provider. Do not stop using the antibiotic even if you start to feel better. General instructions  If you were given a sedative during the procedure, it can affect you for several hours. Do not drive or operate machinery until your health care provider says that it is safe. Drink enough fluid to keep your urine pale yellow. Return to your normal activities as told by your health care provider. Ask your health care provider what activities are safe for you. Keep all follow-up visits. Contact a health care provider if you have: A fever or chills. Blood-tinged urine for more than one day after your procedure. Worsening pain or burning when you pass urine. Pain or burning when passing urine for more than two days after your procedure. Trouble emptying your bladder. Get help right away if you: Have bright red blood in your urine. Are unable to pass urine. Summary A botulinum toxin bladder injection is a procedure to treat an overactive bladder. This is generally a safe procedure. However, problems may occur, including not being able to pass urine, bleeding, infection, pain, and an allergic reaction to the botulinum toxin. You will be told when to stop eating and drinking, and what medicines to change or stop. Follow instructions carefully. After the procedure, it is common to have blood in your urine and to have soreness or burning when  passing urine. Contact a health care provider if you have a fever, blood in your urine for more than a few days, or trouble passing urine. Get help right away if you have bright red blood in your urine, or if you are unable to pass urine. This information is not intended to replace advice given to you by your health care provider. Make sure you discuss any questions you have with your health care provider. Document Revised: 02/14/2021 Document Reviewed: 02/14/2021 Elsevier Patient Education  2024 [employer].

## 2024-05-10 ENCOUNTER — Other Ambulatory Visit: Payer: Self-pay | Admitting: Cardiology

## 2024-05-22 ENCOUNTER — Other Ambulatory Visit: Payer: Self-pay | Admitting: Cardiology

## 2024-05-25 ENCOUNTER — Other Ambulatory Visit: Payer: Self-pay | Admitting: Family Medicine

## 2024-05-25 DIAGNOSIS — F3289 Other specified depressive episodes: Secondary | ICD-10-CM

## 2024-05-25 DIAGNOSIS — E785 Hyperlipidemia, unspecified: Secondary | ICD-10-CM

## 2024-06-05 ENCOUNTER — Telehealth: Payer: Self-pay

## 2024-06-05 NOTE — Telephone Encounter (Signed)
 Pt called today with pain in left flank area and back , feel nausea after having botox  procedure on 08/20, feels like she has bruising  in her stomach.  Pt state's she have not had kidney stone in awhile. Pt decision not to complete the Dysuria work up due to feeling sick and voiced she will go to the ER.  Dysuria  Patient called with c/o dysuria x 2-3 days days.  Pain: stabbing  Severity:9/10  Associated Signs and Symptoms:  Fever: noTemp.0 Chills: no Hematuria: no Urgency: yes Frequency: yes Hesitancy:yes sometimes  Incontinence: yes Nausea: yes Vomiting: no  Urologic History:  Any Recent Urologic Surgeries or Procedures:yes Botox  04/12/2024 Recurrent UTI's:no Cystitis: no  Prostatitis:no Kidney or Bladder Stones: no Plan: Walk-in Clinic: no Appointment w/Physician: [no Lab visit scheduled for urine drop off: No Advice given:  Do you take on daily medications for UTI suppression No

## 2024-06-07 ENCOUNTER — Encounter: Payer: Self-pay | Admitting: Family Medicine

## 2024-06-07 ENCOUNTER — Ambulatory Visit: Payer: Self-pay | Admitting: Family Medicine

## 2024-06-07 ENCOUNTER — Emergency Department (HOSPITAL_COMMUNITY)

## 2024-06-07 ENCOUNTER — Encounter (HOSPITAL_COMMUNITY): Payer: Self-pay

## 2024-06-07 ENCOUNTER — Inpatient Hospital Stay (HOSPITAL_COMMUNITY)
Admission: EM | Admit: 2024-06-07 | Discharge: 2024-06-11 | DRG: 682 | Disposition: A | Discharge status: Home / Self Care | Attending: Family Medicine | Admitting: Family Medicine

## 2024-06-07 ENCOUNTER — Other Ambulatory Visit: Payer: Self-pay

## 2024-06-07 VITALS — BP 131/85 | HR 74 | Resp 18 | Ht 62.0 in

## 2024-06-07 DIAGNOSIS — Z79890 Hormone replacement therapy (postmenopausal): Secondary | ICD-10-CM | POA: Diagnosis not present

## 2024-06-07 DIAGNOSIS — Z8673 Personal history of transient ischemic attack (TIA), and cerebral infarction without residual deficits: Secondary | ICD-10-CM

## 2024-06-07 DIAGNOSIS — Z7982 Long term (current) use of aspirin: Secondary | ICD-10-CM

## 2024-06-07 DIAGNOSIS — F32A Depression, unspecified: Secondary | ICD-10-CM | POA: Diagnosis not present

## 2024-06-07 DIAGNOSIS — E860 Dehydration: Secondary | ICD-10-CM | POA: Diagnosis present

## 2024-06-07 DIAGNOSIS — R10A0 Flank pain, unspecified side: Secondary | ICD-10-CM | POA: Insufficient documentation

## 2024-06-07 DIAGNOSIS — N179 Acute kidney failure, unspecified: Secondary | ICD-10-CM | POA: Diagnosis not present

## 2024-06-07 DIAGNOSIS — R634 Abnormal weight loss: Secondary | ICD-10-CM | POA: Diagnosis not present

## 2024-06-07 DIAGNOSIS — N390 Urinary tract infection, site not specified: Secondary | ICD-10-CM | POA: Diagnosis present

## 2024-06-07 DIAGNOSIS — R7989 Other specified abnormal findings of blood chemistry: Secondary | ICD-10-CM

## 2024-06-07 DIAGNOSIS — Z79899 Other long term (current) drug therapy: Secondary | ICD-10-CM | POA: Diagnosis not present

## 2024-06-07 DIAGNOSIS — I708 Atherosclerosis of other arteries: Secondary | ICD-10-CM | POA: Diagnosis not present

## 2024-06-07 DIAGNOSIS — I160 Hypertensive urgency: Secondary | ICD-10-CM | POA: Diagnosis not present

## 2024-06-07 DIAGNOSIS — I2510 Atherosclerotic heart disease of native coronary artery without angina pectoris: Secondary | ICD-10-CM | POA: Diagnosis not present

## 2024-06-07 DIAGNOSIS — I10 Essential (primary) hypertension: Secondary | ICD-10-CM

## 2024-06-07 DIAGNOSIS — B9620 Unspecified Escherichia coli [E. coli] as the cause of diseases classified elsewhere: Secondary | ICD-10-CM | POA: Diagnosis not present

## 2024-06-07 DIAGNOSIS — I214 Non-ST elevation (NSTEMI) myocardial infarction: Secondary | ICD-10-CM

## 2024-06-07 DIAGNOSIS — E7800 Pure hypercholesterolemia, unspecified: Secondary | ICD-10-CM | POA: Diagnosis not present

## 2024-06-07 DIAGNOSIS — R5383 Other fatigue: Secondary | ICD-10-CM | POA: Diagnosis not present

## 2024-06-07 DIAGNOSIS — Z8249 Family history of ischemic heart disease and other diseases of the circulatory system: Secondary | ICD-10-CM

## 2024-06-07 DIAGNOSIS — E785 Hyperlipidemia, unspecified: Secondary | ICD-10-CM | POA: Diagnosis present

## 2024-06-07 DIAGNOSIS — Z951 Presence of aortocoronary bypass graft: Secondary | ICD-10-CM | POA: Diagnosis not present

## 2024-06-07 DIAGNOSIS — Z1152 Encounter for screening for COVID-19: Secondary | ICD-10-CM

## 2024-06-07 DIAGNOSIS — G4733 Obstructive sleep apnea (adult) (pediatric): Secondary | ICD-10-CM | POA: Diagnosis present

## 2024-06-07 DIAGNOSIS — I2583 Coronary atherosclerosis due to lipid rich plaque: Secondary | ICD-10-CM | POA: Diagnosis not present

## 2024-06-07 DIAGNOSIS — I25810 Atherosclerosis of coronary artery bypass graft(s) without angina pectoris: Secondary | ICD-10-CM | POA: Diagnosis present

## 2024-06-07 DIAGNOSIS — R10A2 Flank pain, left side: Secondary | ICD-10-CM

## 2024-06-07 DIAGNOSIS — E039 Hypothyroidism, unspecified: Secondary | ICD-10-CM | POA: Diagnosis not present

## 2024-06-07 DIAGNOSIS — Z955 Presence of coronary angioplasty implant and graft: Secondary | ICD-10-CM

## 2024-06-07 DIAGNOSIS — F3289 Other specified depressive episodes: Secondary | ICD-10-CM

## 2024-06-07 DIAGNOSIS — R778 Other specified abnormalities of plasma proteins: Secondary | ICD-10-CM | POA: Diagnosis not present

## 2024-06-07 DIAGNOSIS — I350 Nonrheumatic aortic (valve) stenosis: Secondary | ICD-10-CM | POA: Diagnosis not present

## 2024-06-07 LAB — COMPREHENSIVE METABOLIC PANEL WITH GFR
ALT: 41 U/L (ref 0–44)
AST: 77 U/L — ABNORMAL HIGH (ref 15–41)
Albumin: 4.5 g/dL (ref 3.5–5.0)
Alkaline Phosphatase: 93 U/L (ref 38–126)
Anion gap: 15 (ref 5–15)
BUN: 21 mg/dL (ref 8–23)
CO2: 21 mmol/L — ABNORMAL LOW (ref 22–32)
Calcium: 10.3 mg/dL (ref 8.9–10.3)
Chloride: 99 mmol/L (ref 98–111)
Creatinine, Ser: 2.63 mg/dL — ABNORMAL HIGH (ref 0.44–1.00)
GFR, Estimated: 18 mL/min — ABNORMAL LOW (ref >60)
Glucose, Bld: 110 mg/dL — ABNORMAL HIGH (ref 70–99)
Potassium: 3.8 mmol/L (ref 3.5–5.1)
Sodium: 136 mmol/L (ref 135–145)
Total Bilirubin: 1.4 mg/dL — ABNORMAL HIGH (ref 0.0–1.2)
Total Protein: 7.9 g/dL (ref 6.5–8.1)

## 2024-06-07 LAB — RESP PANEL BY RT-PCR (RSV, FLU A&B, COVID)  RVPGX2
Influenza A by PCR: NEGATIVE
Influenza B by PCR: NEGATIVE
Resp Syncytial Virus by PCR: NEGATIVE
SARS Coronavirus 2 by RT PCR: NEGATIVE

## 2024-06-07 LAB — CBC WITH DIFFERENTIAL/PLATELET
Abs Immature Granulocytes: 0.11 K/uL — ABNORMAL HIGH (ref 0.00–0.07)
Basophils Absolute: 0.1 K/uL (ref 0.0–0.1)
Basophils Relative: 0 %
Eosinophils Absolute: 0.1 K/uL (ref 0.0–0.5)
Eosinophils Relative: 1 %
HCT: 40.3 % (ref 36.0–46.0)
Hemoglobin: 13.4 g/dL (ref 12.0–15.0)
Immature Granulocytes: 1 %
Lymphocytes Relative: 14 %
Lymphs Abs: 2.1 K/uL (ref 0.7–4.0)
MCH: 30.8 pg (ref 26.0–34.0)
MCHC: 33.3 g/dL (ref 30.0–36.0)
MCV: 92.6 fL (ref 80.0–100.0)
Monocytes Absolute: 1.6 K/uL — ABNORMAL HIGH (ref 0.1–1.0)
Monocytes Relative: 11 %
Neutro Abs: 11.2 K/uL — ABNORMAL HIGH (ref 1.7–7.7)
Neutrophils Relative %: 73 %
Platelets: 351 K/uL (ref 150–400)
RBC: 4.35 MIL/uL (ref 3.87–5.11)
RDW: 13.7 % (ref 11.5–15.5)
WBC: 15.1 K/uL — ABNORMAL HIGH (ref 4.0–10.5)
nRBC: 0 % (ref 0.0–0.2)

## 2024-06-07 LAB — LIPASE, BLOOD: Lipase: 14 U/L (ref 11–51)

## 2024-06-07 LAB — MAGNESIUM: Magnesium: 1.8 mg/dL (ref 1.7–2.4)

## 2024-06-07 LAB — TSH: TSH: 20.2 u[IU]/mL — ABNORMAL HIGH (ref 0.350–4.500)

## 2024-06-07 LAB — TROPONIN T, HIGH SENSITIVITY
Troponin T High Sensitivity: 641 ng/L (ref 0–19)
Troponin T High Sensitivity: 598 ng/L (ref 0–19)

## 2024-06-07 LAB — PRO BRAIN NATRIURETIC PEPTIDE: Pro Brain Natriuretic Peptide: 4551 pg/mL — ABNORMAL HIGH (ref <300.0)

## 2024-06-07 MED ORDER — ROSUVASTATIN CALCIUM 20 MG PO TABS
20.0000 mg | ORAL_TABLET | Freq: Every morning | ORAL | Status: DC
  Administered 2024-06-08 – 2024-06-11 (×4): 20 mg via ORAL
  Filled 2024-06-07 (×4): qty 1

## 2024-06-07 MED ORDER — LISINOPRIL 5 MG PO TABS
5.0000 mg | ORAL_TABLET | Freq: Every day | ORAL | Status: DC
  Administered 2024-06-07: 5 mg via ORAL
  Filled 2024-06-07: qty 1

## 2024-06-07 MED ORDER — ACETAMINOPHEN 325 MG PO TABS
650.0000 mg | ORAL_TABLET | Freq: Four times a day (QID) | ORAL | Status: DC | PRN
  Administered 2024-06-09: 650 mg via ORAL
  Filled 2024-06-07: qty 2

## 2024-06-07 MED ORDER — PAROXETINE HCL 20 MG PO TABS
40.0000 mg | ORAL_TABLET | Freq: Every day | ORAL | Status: DC
  Administered 2024-06-07 – 2024-06-10 (×4): 40 mg via ORAL
  Filled 2024-06-07 (×4): qty 2

## 2024-06-07 MED ORDER — SODIUM CHLORIDE 0.9 % IV SOLN: INTRAVENOUS | Status: DC

## 2024-06-07 MED ORDER — HEPARIN SODIUM (PORCINE) 5000 UNIT/ML IJ SOLN
5000.0000 [IU] | Freq: Three times a day (TID) | INTRAMUSCULAR | Status: DC
  Administered 2024-06-07 – 2024-06-08 (×2): 5000 [IU] via SUBCUTANEOUS
  Filled 2024-06-07 (×2): qty 1

## 2024-06-07 MED ORDER — CHLORHEXIDINE GLUCONATE CLOTH 2 % EX PADS
6.0000 | MEDICATED_PAD | Freq: Every day | CUTANEOUS | Status: DC
  Administered 2024-06-07 – 2024-06-10 (×4): 6 via TOPICAL

## 2024-06-07 MED ORDER — ONDANSETRON HCL 4 MG PO TABS: 4.0000 mg | ORAL_TABLET | Freq: Four times a day (QID) | ORAL | Status: DC | PRN

## 2024-06-07 MED ORDER — POLYETHYLENE GLYCOL 3350 17 G PO PACK: 17.0000 g | PACK | Freq: Every day | ORAL | Status: DC | PRN

## 2024-06-07 MED ORDER — NITROGLYCERIN IN D5W 200-5 MCG/ML-% IV SOLN
5.0000 ug/min | INTRAVENOUS | Status: DC
  Administered 2024-06-07: 5 ug/min via INTRAVENOUS
  Filled 2024-06-07: qty 250

## 2024-06-07 MED ORDER — NITROGLYCERIN 2 % TD OINT
1.0000 [in_us] | TOPICAL_OINTMENT | Freq: Once | TRANSDERMAL | Status: AC
  Administered 2024-06-07: 1 [in_us] via TOPICAL
  Filled 2024-06-07: qty 1

## 2024-06-07 MED ORDER — ISOSORBIDE MONONITRATE ER 30 MG PO TB24
30.0000 mg | ORAL_TABLET | Freq: Two times a day (BID) | ORAL | Status: DC
  Administered 2024-06-07 – 2024-06-11 (×8): 30 mg via ORAL
  Filled 2024-06-07 (×8): qty 1

## 2024-06-07 MED ORDER — PAROXETINE HCL 40 MG PO TABS: 40.0000 mg | ORAL_TABLET | Freq: Every day | ORAL | 3 refills | Status: AC

## 2024-06-07 MED ORDER — BISOPROLOL FUMARATE 5 MG PO TABS
2.5000 mg | ORAL_TABLET | Freq: Every day | ORAL | Status: DC
  Administered 2024-06-08: 2.5 mg via ORAL
  Filled 2024-06-07: qty 1

## 2024-06-07 MED ORDER — ASPIRIN 81 MG PO TBEC
81.0000 mg | DELAYED_RELEASE_TABLET | Freq: Every day | ORAL | Status: DC
  Administered 2024-06-07 – 2024-06-10 (×4): 81 mg via ORAL
  Filled 2024-06-07 (×4): qty 1

## 2024-06-07 MED ORDER — LACTATED RINGERS IV BOLUS: 1000.0000 mL | Freq: Once | INTRAVENOUS | Status: DC

## 2024-06-07 MED ORDER — LACTATED RINGERS IV BOLUS
500.0000 mL | Freq: Once | INTRAVENOUS | Status: AC
Start: 2024-06-07 — End: 2024-06-07
  Administered 2024-06-07: 500 mL via INTRAVENOUS

## 2024-06-07 MED ORDER — ACETAMINOPHEN 650 MG RE SUPP: 650.0000 mg | Freq: Four times a day (QID) | RECTAL | Status: DC | PRN

## 2024-06-07 MED ORDER — ONDANSETRON HCL 4 MG/2ML IJ SOLN
4.0000 mg | Freq: Four times a day (QID) | INTRAMUSCULAR | Status: DC | PRN
  Administered 2024-06-09: 4 mg via INTRAVENOUS
  Filled 2024-06-07: qty 2

## 2024-06-07 MED ORDER — LEVOTHYROXINE SODIUM 75 MCG PO TABS
75.0000 ug | ORAL_TABLET | Freq: Every day | ORAL | Status: DC
  Administered 2024-06-08 – 2024-06-09 (×2): 75 ug via ORAL
  Filled 2024-06-07 (×2): qty 1

## 2024-06-07 NOTE — ED Triage Notes (Signed)
 Pt arrived via POV from Dr Metta office for further evaluation of dehydration, increased fatigue and left flank pain. Per a note in the Pts Chart, Dr Bluford is recommending IV Fluids and lab work.

## 2024-06-07 NOTE — H&P (Signed)
 History and Physical    Lacey Bailey FMW:[id number] DOB: [birth year] DOA: 06/07/2024  PCP: Cook, Jayce G, DO   Patient coming from: Home  I have personally briefly reviewed patient's old medical records in [facility]  Chief Complaint: Dizziness, Cough, left flank pain  HPI: Lacey Bailey is a 78 y.o. female with medical history significant for CABG, hypertension, OSA, hypothyroidism, depression. Patient went to see her outpatient provider, with multiple complaints, she was referred to the ED.  Her complaints included dizziness, cough, fatigue, difficulty breathing, left flank pain, reduced oral intake, and overall feeling unwell over the past 1 week.  No vomiting no loose stools.  She reports chronic chest pains, her last chest pain was yesterday, described as pressure-like, she took a dose of nitroglycerin  and felt better.  She takes nitro about 2-3 times a week for same symptoms.  No chest pain today.  ED Course: Blood pressure ranging mostly from 130s to 179.  Temperature 97.  Heart rate 70s.  Respiratory rate 12-19.  O2 sat greater than 96% on room air. Troponin 641>> 598. Elevated at 2.63. COVID influenza RSV negative. TSH 20.2 proBNP 4551. EDP talked to Dr. Marlyn, recommended admission, echocardiogram, no heparin  at this time unless troponin significantly increases.  Okay to stay here at [facility].  Review of Systems: As per HPI all other systems reviewed and negative.  Past Medical History:  Diagnosis Date   Aortic sclerosis    CAD (coronary artery disease)    a. s/p CABG in 2003 b. multiple interventions since at outside hospitals c. DES to PLB in 11/2014 d. angioplasty to mid-RCA and mid-LCx in 10/2015 with patent LIMA-LAD and occlusion of all vein grafts e. 12/2017: occlusion of vein grafts and native LCx and RCA with patent LIMA-LAD. Re-do CABG NOT recommended by CT surgery   Depression    Essential hypertension    History of kidney stones    Hyperlipidemia     Hypothyroidism    Sleep apnea    TIA (transient ischemic attack) ~ 2013    Past Surgical History:  Procedure Laterality Date   ABDOMINAL HYSTERECTOMY     APPENDECTOMY     BLEPHAROPLASTY Bilateral    uppers   BOTOX  INJECTION N/A 03/23/2022   Procedure: BOTOX  INJECTION- 100 units;  Surgeon: Sherrilee Belvie CROME, MD;  Location: [facility];  Service: Urology;  Laterality: N/A;   CARDIAC CATHETERIZATION  11/22/2015   CARDIAC CATHETERIZATION N/A 11/22/2015   Procedure: Left Heart Cath and Cors/Grafts Angiography;  Surgeon: Alm LELON Clay, MD;  Location: [facility] INVASIVE CV LAB;  Service: Cardiovascular;  Laterality: N/A;   CARDIAC CATHETERIZATION N/A 11/22/2015   Procedure: Coronary Stent Intervention;  Surgeon: Alm LELON Clay, MD;  Location: [facility] INVASIVE CV LAB;  Service: Cardiovascular;  Laterality: N/A;   CATARACT EXTRACTION, BILATERAL Bilateral    CESAREAN SECTION  1977   CORONARY ANGIOPLASTY WITH STENT PLACEMENT  several   last one was 11/2014:  DES to the posterolateral branch /notes 11/20/2015   CORONARY ARTERY BYPASS GRAFT  2003   [place] ; CABG X 4   CYSTOSCOPY WITH INJECTION N/A 03/23/2022   Procedure: CYSTOSCOPY WITH INJECTION;  Surgeon: Sherrilee Belvie CROME, MD;  Location: [facility];  Service: Urology;  Laterality: N/A;   DILATION AND CURETTAGE OF UTERUS     KIDNEY STONE SURGERY     opened me up   LAPAROSCOPIC CHOLECYSTECTOMY     LEFT HEART CATH AND CORS/GRAFTS ANGIOGRAPHY N/A 01/03/2018   Procedure: LEFT  HEART CATH AND CORS/GRAFTS ANGIOGRAPHY;  Surgeon: [name], Peter M, MD;  Location: [facility] INVASIVE CV LAB;  Service: Cardiovascular;  Laterality: N/A;   TONSILLECTOMY     TUBAL LIGATION       reports that she has never smoked. She has never used smokeless tobacco. She reports that she does not currently use alcohol. She reports that she does not use drugs.  No Known Allergies  Family History  Problem Relation Age of Onset   Heart attack Mother    Heart disease Mother    Heart disease  Brother     Prior to Admission medications   Medication Sig Start Date End Date Taking? Authorizing Provider  acetaminophen  (TYLENOL ) 500 MG tablet Take 1,000 mg by mouth every 8 (eight) hours as needed for moderate pain.    [provider]  albuterol  (VENTOLIN  HFA) 108 (90 Base) MCG/ACT inhaler Inhale 2 puffs into the lungs every 4 (four) hours as needed. 06/16/23   Stuart Vernell Norris, PA-C  aspirin  81 MG tablet Take 81 mg by mouth at bedtime.     [provider]  bisoprolol  (ZEBETA ) 5 MG tablet Take 0.5 tablets (2.5 mg total) by mouth daily. 01/08/21   Debera Jayson MATSU, MD  isosorbide  mononitrate (IMDUR ) 30 MG 24 hr tablet TAKE 1 TABLET BY MOUTH TWICE DAILY 05/10/24   Debera Jayson MATSU, MD  levothyroxine  (SYNTHROID ) 75 MCG tablet TAKE 1 TABLET BY MOUTH EVERY DAY BEFORE BREAKFAST 11/04/20   Waddell, Malena M, DO  lisinopril  (ZESTRIL ) 5 MG tablet Take 1 tablet (5 mg total) by mouth daily. 11/04/20   Waddell Catholic M, DO  loratadine (CLARITIN) 10 MG tablet Take 10 mg by mouth daily as needed.    [provider]  nitroGLYCERIN  (NITROSTAT ) 0.4 MG SL tablet DISSOLVE 1 TABLET UNDER THE TONGUE EVERY 5 MINUTES AS NEEDED FOR CHEST PAIN. DO NOT EXCEED A TOTAL OF 3 DOSES IN 15 MINUTES. 05/22/24   Debera Jayson MATSU, MD  PARoxetine  (PAXIL ) 40 MG tablet Take 1 tablet (40 mg total) by mouth daily. 06/07/24   Cook, Jayce G, DO  rosuvastatin  (CRESTOR ) 20 MG tablet Take 1 tablet (20 mg total) by mouth in the morning. 11/04/20   Waddell, Malena M, DO  SODIUM FLUORIDE 5000 PPM 1.1 % GEL dental gel Take by mouth. 12/08/22   [provider]    Physical Exam: Vitals:   06/07/24 1900 06/07/24 1902 06/07/24 1915 06/07/24 1930  BP: (!) 160/147 (!) 156/95 (!) 155/100 (!) 157/85  Pulse: 71 75 73 73  Resp: 19 15 17 19   Temp:      TempSrc:      SpO2: 99% 94% 98% 99%  Weight:      Height:        Constitutional: NAD, calm, comfortable Vitals:   06/07/24 1900 06/07/24 1902  06/07/24 1915 06/07/24 1930  BP: (!) 160/147 (!) 156/95 (!) 155/100 (!) 157/85  Pulse: 71 75 73 73  Resp: 19 15 17 19   Temp:      TempSrc:      SpO2: 99% 94% 98% 99%  Weight:      Height:       Eyes: PERRL, lids and conjunctivae normal ENMT: Mucous membranes are mildly dry.   Neck: normal, supple, no masses, no thyromegaly Respiratory: clear to auscultation bilaterally, no wheezing, no crackles. Normal respiratory effort. No accessory muscle use.  Cardiovascular: Regular rate and rhythm, 3/6 systolic murmurs /no rubs / gallops. No extremity edema.  Extremities warm.  Abdomen: no tenderness, no masses palpated. No hepatosplenomegaly.   Musculoskeletal: no clubbing / cyanosis. No joint deformity upper and lower extremities.  Skin: no rashes, lesions, ulcers. No induration Neurologic: No facial asymmetry, moves extremities spontaneously, speech fluent Psychiatric: Normal judgment and insight. Alert and oriented x 3. Normal mood.   Labs on Admission: I have personally reviewed following labs and imaging studies  CBC: Recent Labs  Lab 06/07/24 1700  WBC 15.1*  NEUTROABS 11.2*  HGB 13.4  HCT 40.3  MCV 92.6  PLT 351   Basic Metabolic Panel: Recent Labs  Lab 06/07/24 1700  NA 136  K 3.8  CL 99  CO2 21*  GLUCOSE 110*  BUN 21  CREATININE 2.63*  CALCIUM  10.3  MG 1.8   GFR: Estimated Creatinine Clearance: 18.9 mL/min (A) (by C-G formula based on SCr of 2.63 mg/dL (H)). Liver Function Tests: Recent Labs  Lab 06/07/24 1700  AST 77*  ALT 41  ALKPHOS 93  BILITOT 1.4*  PROT 7.9  ALBUMIN 4.5   Recent Labs  Lab 06/07/24 1700  LIPASE 14   BNP (last 3 results) Recent Labs    06/07/24 1700  PROBNP 4,551.0*   Radiological Exams on Admission: CT CHEST ABDOMEN PELVIS WO CONTRAST Result Date: 06/07/2024 CLINICAL DATA:  Unintended weight loss.  Fatigue.  Left flank pain. EXAM: CT CHEST, ABDOMEN AND PELVIS WITHOUT CONTRAST TECHNIQUE: Multidetector CT imaging of the  chest, abdomen and pelvis was performed following the standard protocol without IV contrast. RADIATION DOSE REDUCTION: This exam was performed according to the departmental dose-optimization program which includes automated exposure control, adjustment of the mA and/or kV according to patient size and/or use of iterative reconstruction technique. COMPARISON:  None Available. FINDINGS: CT CHEST FINDINGS Cardiovascular: Diffuse coronary artery and aortic atherosclerosis. Prior CABG. Heart is normal size. Aorta is normal caliber. Mediastinum/Nodes: No mediastinal, hilar, or axillary adenopathy. Trachea and esophagus are unremarkable. Thyroid  unremarkable. Lungs/Pleura: No confluent airspace opacities or effusions. Musculoskeletal: Chest wall soft tissues are unremarkable. No acute bony abnormality. CT ABDOMEN PELVIS FINDINGS Hepatobiliary: No focal liver abnormality is seen. Status post cholecystectomy. No biliary dilatation. Pancreas: No focal abnormality or ductal dilatation. Spleen: No focal abnormality.  Normal size. Adrenals/Urinary Tract: No adrenal abnormality. No focal renal abnormality. No stones or hydronephrosis. Urinary bladder is unremarkable. Stomach/Bowel: Stomach, large and small bowel grossly unremarkable. Vascular/Lymphatic: Diffuse aortoiliac atherosclerosis. No evidence of aneurysm or adenopathy. Reproductive: Prior hysterectomy.  No adnexal masses. Other: No free fluid or free air. Musculoskeletal: No acute bony abnormality. IMPRESSION: No acute findings in the chest, abdomen or pelvis. Diffuse aortoiliac atherosclerosis. Coronary artery disease, status post CABG. Electronically Signed   By: Franky Crease M.D.   On: 06/07/2024 18:43   EKG: Independently reviewed.  Diffuse T wave inversions in multiple leads.-Significantly changed from prior sinus rhythm, rate 73, QTc 474.  Assessment/Plan Principal Problem:   AKI (acute kidney injury) Active Problems:   NSTEMI (non-ST elevated myocardial  infarction) (HCC)   Hypothyroidism   Essential hypertension   S/P CABG (coronary artery bypass graft)  Assessment and Plan:  AKI-creatinine 2.63, baseline 1-1.3.  In the setting of poor oral intake over the past week. - N/s 100cc/hr x 15hrs - Hold low-dose lisinopril  5 mg daily  NSTEMI with history of CABG 2003 with multiple PCI's-chest pain resolved with nitroglycerin .  Chronic chest pains, typically takes nitroglycerin  2-3 times a week.  Follows with Dr. Debera.  Most recent angiography 2019 showed continued patent LIMA to LAD with occlusion  of all vein grafts as well as native circumflex and RCA.  Redo CABG was not recommended at that time and revascularization options are limited.  Last echo 06/2023 EF of 60 to 65%, G1DD, moderate aortic stenosis. -Troponin today 641>> 498.  With elevated BNP at 4551 in the setting of AKI-no sign of volume overload, clear on imaging. - EDP talked to cardiologist Dr. Linden echo, start heparin  if troponin significantly elevated, cardiology team to see here at [facility]. - Started on nitroglycerin  drip, continue - Resume aspirin , bisoprolol , Crestor   Elevated blood pressure-systolic up to 170s. - Continue nitroglycerin  - Holding low-dose lisinopril  with AKI  Left flank pain-resolved, CT chest abdomen pelvis without contrast negative for acute abnormality.  Moderate aortic stenosis  Hypothyroidism-TSH elevated at 20.2. - Check T3, free T4 - Resume Synthroid  75 mg daily   DVT prophylaxis: Heparin  Code Status: Full code, confirmed with patient and spouse at bedside Family Communication: Spouse at bedside Disposition Plan: ~ 2 days Consults called: Cardiology Admission status: Inpatient, stepdown I certify that at the point of admission it is my clinical judgment that the patient will require inpatient hospital care spanning beyond 2 midnights from the point of admission due to high intensity of service, high risk for further  deterioration and high frequency of surveillance required.   CRITICAL CARE Performed by: Tully FORBES Carwin  Total critical care time: 65 minutes  Critical care time was exclusive of separately billable procedures and treating other patients.  Critical care was necessary to treat or prevent imminent or life-threatening deterioration.  Critical care was time spent personally by me on the following activities: development of treatment plan with patient and/or surrogate as well as nursing, discussions with consultants, evaluation of patient's response to treatment, examination of patient, obtaining history from patient or surrogate, ordering and performing treatments and interventions, ordering and review of laboratory studies, ordering and review of radiographic studies, pulse oximetry and re-evaluation of patient's condition.  Author: Tully FORBES Carwin, MD 06/07/2024 9:57 PM  For on call review [email or web address].

## 2024-06-07 NOTE — ED Notes (Signed)
 Lisinopril  administered. BP noted to be decreasing (156/95). NTG paste and infusions withheld until orders can be confirmed with MD.

## 2024-06-07 NOTE — Progress Notes (Signed)
 Subjective:  Patient ID: Lacey Bailey, female    DOB: Mar 12, 1946  Age: 78 y.o. MRN: [id number]  CC:   Chief Complaint  Patient presents with   Flank Pain    Complains of left flank pain since Friday. Denies injury. Denies uti symptoms    Cough    Pt complains of coughing up green phlegm, and vomiting for x1 week. No appitie and decreased water  intake. Denies fever.      HPI:  78 year old female with severe CAD (occluded grafts and not a candidate for redo CABG) presents with multiple complaints.  Patient reports that she has been sick for the past week.  She does very little activity due to her underlying cardiac disease.  She has had respiratory symptoms, mostly cough.  Cough is productive.  Home COVID test negative.  She has had decreased p.o. intake as Bailey as nausea and vomiting.  She feels dizzy and fatigued.  She is also had ongoing left flank pain.  No current urinary symptoms other than those at her baseline.  She feels very poorly.  No documented fever.  Vitals stable here today.  Patient Active Problem List   Diagnosis Date Noted   Flank pain 06/07/2024   S/P CABG (coronary artery bypass graft) 06/28/2023   CAD (coronary artery disease) 06/28/2023   OSA (obstructive sleep apnea) 11/04/2020   Depression 09/03/2015   Hypothyroidism 09/03/2015   Essential hypertension 09/03/2015   Hyperlipidemia with target LDL less than 70 09/03/2015    Social Hx   Social History   Socioeconomic History   Marital status: Married    Spouse name: Not on file   Number of children: Not on file   Years of education: Not on file   Highest education level: Not on file  Occupational History   Not on file  Tobacco Use   Smoking status: Never   Smokeless tobacco: Never  Vaping Use   Vaping status: Never Used  Substance and Sexual Activity   Alcohol use: Not Currently    Alcohol/week: 0.0 standard drinks of alcohol    Comment: 11/22/2015 nothing since ~ 2005; occasionally had a drink  w/dinner before 2005   Drug use: No   Sexual activity: Not Currently  Other Topics Concern   Not on file  Social History Narrative   Not on file   Social Drivers of Health   Financial Resource Strain: Not on file  Food Insecurity: Not on file  Transportation Needs: Not on file  Physical Activity: Not on file  Stress: Not on file  Social Connections: Not on file    Review of Systems Per HPI  Objective:  BP 131/85   Pulse 74   Resp 18   Ht 5' 2 (1.575 m)   SpO2 98%   BMI 40.31 kg/m      06/07/2024    2:27 PM 04/12/2024   10:08 AM 03/15/2024    1:29 PM  BP/Weight  Systolic BP 131 107 122  Diastolic BP 85 73 78  Wt. (Lbs)   220.4  BMI   39.67 kg/m2    Physical Exam Vitals and nursing note reviewed.  Constitutional:      General: She is not in acute distress.    Appearance: She is obese.  HENT:     Head: Normocephalic and atraumatic.  Eyes:     General:        Right eye: No discharge.        Left eye:  No discharge.     Conjunctiva/sclera: Conjunctivae normal.  Cardiovascular:     Rate and Rhythm: Normal rate and regular rhythm.     Heart sounds: Murmur heard.  Pulmonary:     Effort: Pulmonary effort is normal.     Breath sounds: Normal breath sounds. No wheezing, rhonchi or rales.  Neurological:     Mental Status: She is alert.     Lab Results  Component Value Date   WBC 9.5 08/23/2023   HGB 13.0 08/23/2023   HCT 39.4 08/23/2023   PLT 372 08/23/2023   GLUCOSE 116 (H) 08/23/2023   CHOL 124 08/23/2023   TRIG 180 (H) 08/23/2023   HDL 36 (L) 08/23/2023   LDLCALC 58 08/23/2023   ALT 12 08/23/2023   AST 22 08/23/2023   NA 142 08/23/2023   K 4.8 08/23/2023   CL 104 08/23/2023   CREATININE 1.06 (H) 08/23/2023   BUN 13 08/23/2023   CO2 22 08/23/2023   TSH 2.460 05/17/2020   INR 0.96 01/28/2023   HGBA1C 5.8 (H) 08/23/2023     Assessment & Plan:  Left flank pain Assessment & Plan: Patient with a multitude of symptoms including flank pain.   No flank pain currently.  Her symptoms are concerning.  Needs labs and likely needs fluids.  Suspect that she is volume depleted.  We discussed work up in the outpatient setting versus going to the hospital.  She elected for the latter.  Patient's husband is taking her via private vehicle.   Other depression Assessment & Plan: Paxil  refilled.  Orders: -     PARoxetine  HCl; Take 1 tablet (40 mg total) by mouth daily.  Dispense: 90 tablet; Refill: 3  Essential hypertension Assessment & Plan: BP stable currently.    Jacqulyn Ahle DO [facility]

## 2024-06-07 NOTE — Assessment & Plan Note (Signed)
 BP stable currently

## 2024-06-07 NOTE — Patient Instructions (Signed)
 Straight to the hospital.  [facility] you feel better soon.   Take care  Dr. Bluford

## 2024-06-07 NOTE — Assessment & Plan Note (Signed)
Paxil refilled.

## 2024-06-07 NOTE — ED Provider Notes (Signed)
 [place] [facility] AT [facility] Provider Note   CSN: [id number] Arrival date & time: 06/07/24  1521     Patient presents with: Flank Pain   Lacey Bailey is a 78 y.o. female.    Flank Pain Associated symptoms include shortness of breath.  Patient presents for multiple complaints.  Medical history includes HTN, HLD, depression, OSA, CAD, nephrolithiasis.  Over the past week, she has had congestion, productive cough, shortness of breath, decreased p.o. intake, nausea.  She has felt dizzy and fatigued.  She has had left flank pain and dysuria.  She was seen by PCP earlier today.  She was sent to the ED for further evaluation.  Patient states that her left flank pain have subsided.  She denies any current nausea.  She has Nexus of cardiac history.  At baseline, she is limited to approximately 60 steps at a time.  She has had decreased exercise tolerance this week.     Prior to Admission medications   Medication Sig Start Date End Date Taking? Authorizing Provider  acetaminophen  (TYLENOL ) 500 MG tablet Take 1,000 mg by mouth every 8 (eight) hours as needed for moderate pain.   Yes [provider]  aspirin  81 MG tablet Take 81 mg by mouth at bedtime.    Yes [provider]  bisoprolol  (ZEBETA ) 5 MG tablet Take 0.5 tablets (2.5 mg total) by mouth daily. 01/08/21  Yes Debera Jayson MATSU, MD  guaiFENesin (MUCINEX) 600 MG 12 hr tablet Take 600 mg by mouth 2 (two) times daily as needed for cough or to loosen phlegm.   Yes [provider]  isosorbide  mononitrate (IMDUR ) 30 MG 24 hr tablet TAKE 1 TABLET BY MOUTH TWICE DAILY 05/10/24  Yes Debera Jayson MATSU, MD  levothyroxine  (SYNTHROID ) 75 MCG tablet TAKE 1 TABLET BY MOUTH EVERY DAY BEFORE BREAKFAST Patient taking differently: Take 75 mcg by mouth daily before breakfast. TAKE 1 TABLET BY MOUTH EVERY DAY BEFORE BREAKFAST 11/04/20  Yes Waddell, Malena M, DO  lisinopril  (ZESTRIL ) 5 MG tablet Take 1  tablet (5 mg total) by mouth daily. 11/04/20  Yes Waddell, Malena M, DO  loratadine (CLARITIN) 10 MG tablet Take 10 mg by mouth daily as needed for allergies.   Yes [provider]  Melatonin 1 MG CHEW Chew 1 tablet by mouth at bedtime as needed (sleep).   Yes [provider]  nitroGLYCERIN  (NITROSTAT ) 0.4 MG SL tablet DISSOLVE 1 TABLET UNDER THE TONGUE EVERY 5 MINUTES AS NEEDED FOR CHEST PAIN. DO NOT EXCEED A TOTAL OF 3 DOSES IN 15 MINUTES. Patient taking differently: Place 0.4 mg under the tongue every 5 (five) minutes as needed for chest pain. 05/22/24  Yes Debera Jayson MATSU, MD  PARoxetine  (PAXIL ) 40 MG tablet Take 1 tablet (40 mg total) by mouth daily. Patient taking differently: Take 40 mg by mouth at bedtime. 06/07/24  Yes Cook, Jayce G, DO  rosuvastatin  (CRESTOR ) 20 MG tablet Take 1 tablet (20 mg total) by mouth in the morning. 11/04/20  Yes Taylor, Malena M, DO  SODIUM FLUORIDE 5000 PPM 1.1 % GEL dental gel Place 1 Application onto teeth in the morning and at bedtime. 12/08/22  Yes [provider]  albuterol  (VENTOLIN  HFA) 108 (90 Base) MCG/ACT inhaler Inhale 2 puffs into the lungs every 4 (four) hours as needed. Patient not taking: Reported on 06/07/2024 06/16/23   Stuart Vernell Norris, PA-C    Allergies: Patient has no known allergies.    Review of  Systems  Constitutional:  Positive for activity change, appetite change and fatigue.  HENT:  Positive for congestion.   Respiratory:  Positive for shortness of breath.   Gastrointestinal:  Positive for nausea.  Genitourinary:  Positive for flank pain.  Neurological:  Positive for weakness (Generalized).  All other systems reviewed and are negative.   Updated Vital Signs BP (!) 157/85   Pulse 73   Temp (!) 97 F (36.1 C) (Temporal)   Resp 19   Ht 5' 2 (1.575 m)   Wt 100 kg   SpO2 99%   BMI 40.32 kg/m   Physical Exam Vitals and nursing note reviewed.  Constitutional:      General: She is not in  acute distress.    Appearance: Normal appearance. She is well-developed. She is not ill-appearing, toxic-appearing or diaphoretic.  HENT:     Head: Normocephalic and atraumatic.     Right Ear: External ear normal.     Left Ear: External ear normal.     Nose: Nose normal.     Mouth/Throat:     Mouth: Mucous membranes are moist.  Eyes:     Extraocular Movements: Extraocular movements intact.     Conjunctiva/sclera: Conjunctivae normal.  Cardiovascular:     Rate and Rhythm: Normal rate and regular rhythm.     Heart sounds: No murmur heard. Pulmonary:     Effort: Pulmonary effort is normal. No respiratory distress.     Breath sounds: Normal breath sounds. No wheezing or rales.  Abdominal:     General: There is no distension.     Palpations: Abdomen is soft.     Tenderness: There is abdominal tenderness. There is no guarding or rebound.  Musculoskeletal:        General: No swelling. Normal range of motion.     Cervical back: Normal range of motion and neck supple.  Skin:    General: Skin is warm and dry.     Coloration: Skin is not jaundiced or pale.  Neurological:     General: No focal deficit present.     Mental Status: She is alert and oriented to person, place, and time.  Psychiatric:        Mood and Affect: Mood normal.        Behavior: Behavior normal.     (all labs ordered are listed, but only abnormal results are displayed) Labs Reviewed  COMPREHENSIVE METABOLIC PANEL WITH GFR - Abnormal; Notable for the following components:      Result Value   CO2 21 (*)    Glucose, Bld 110 (*)    Creatinine, Ser 2.63 (*)    AST 77 (*)    Total Bilirubin 1.4 (*)    GFR, Estimated 18 (*)    All other components within normal limits  CBC WITH DIFFERENTIAL/PLATELET - Abnormal; Notable for the following components:   WBC 15.1 (*)    Neutro Abs 11.2 (*)    Monocytes Absolute 1.6 (*)    Abs Immature Granulocytes 0.11 (*)    All other components within normal limits  PRO BRAIN  NATRIURETIC PEPTIDE - Abnormal; Notable for the following components:   Pro Brain Natriuretic Peptide 4,551.0 (*)    All other components within normal limits  TROPONIN T, HIGH SENSITIVITY - Abnormal; Notable for the following components:   Troponin T High Sensitivity 641 (*)    All other components within normal limits  TROPONIN T, HIGH SENSITIVITY - Abnormal; Notable for the following components:   Troponin  T High Sensitivity 598 (*)    All other components within normal limits  RESP PANEL BY RT-PCR (RSV, FLU A&B, COVID)  RVPGX2  LIPASE, BLOOD  MAGNESIUM  URINALYSIS, ROUTINE W REFLEX MICROSCOPIC  TSH    EKG: EKG Interpretation Date/Time:  Wednesday June 07 2024 19:45:18 EDT Ventricular Rate:  73 PR Interval:  189 QRS Duration:  96 QT Interval:  430 QTC Calculation: 474 R Axis:   26  Text Interpretation: Sinus rhythm Atrial premature complex RSR' in V1 or V2, right VCD or RVH LVH with secondary repolarization abnormality Confirmed by Melvenia Motto [phone]) on 06/07/2024 7:48:45 PM  Radiology: CT CHEST ABDOMEN PELVIS WO CONTRAST Result Date: 06/07/2024 CLINICAL DATA:  Unintended weight loss.  Fatigue.  Left flank pain. EXAM: CT CHEST, ABDOMEN AND PELVIS WITHOUT CONTRAST TECHNIQUE: Multidetector CT imaging of the chest, abdomen and pelvis was performed following the standard protocol without IV contrast. RADIATION DOSE REDUCTION: This exam was performed according to the departmental dose-optimization program which includes automated exposure control, adjustment of the mA and/or kV according to patient size and/or use of iterative reconstruction technique. COMPARISON:  None Available. FINDINGS: CT CHEST FINDINGS Cardiovascular: Diffuse coronary artery and aortic atherosclerosis. Prior CABG. Heart is normal size. Aorta is normal caliber. Mediastinum/Nodes: No mediastinal, hilar, or axillary adenopathy. Trachea and esophagus are unremarkable. Thyroid  unremarkable. Lungs/Pleura: No confluent  airspace opacities or effusions. Musculoskeletal: Chest wall soft tissues are unremarkable. No acute bony abnormality. CT ABDOMEN PELVIS FINDINGS Hepatobiliary: No focal liver abnormality is seen. Status post cholecystectomy. No biliary dilatation. Pancreas: No focal abnormality or ductal dilatation. Spleen: No focal abnormality.  Normal size. Adrenals/Urinary Tract: No adrenal abnormality. No focal renal abnormality. No stones or hydronephrosis. Urinary bladder is unremarkable. Stomach/Bowel: Stomach, large and small bowel grossly unremarkable. Vascular/Lymphatic: Diffuse aortoiliac atherosclerosis. No evidence of aneurysm or adenopathy. Reproductive: Prior hysterectomy.  No adnexal masses. Other: No free fluid or free air. Musculoskeletal: No acute bony abnormality. IMPRESSION: No acute findings in the chest, abdomen or pelvis. Diffuse aortoiliac atherosclerosis. Coronary artery disease, status post CABG. Electronically Signed   By: Franky Crease M.D.   On: 06/07/2024 18:43     Procedures   Medications Ordered in the ED  isosorbide  mononitrate (IMDUR ) 24 hr tablet 30 mg (has no administration in time range)  lisinopril  (ZESTRIL ) tablet 5 mg (5 mg Oral Given 06/07/24 1902)  0.9 %  sodium chloride  infusion ( Intravenous New Bag/Given 06/07/24 1934)  nitroGLYCERIN  50 mg in dextrose  5 % 250 mL (0.2 mg/mL) infusion (has no administration in time range)  lactated ringers  bolus 500 mL (0 mLs Intravenous Stopped 06/07/24 1833)  nitroGLYCERIN  (NITROGLYN) 2 % ointment 1 inch (1 inch Topical Given 06/07/24 1914)                                    Medical Decision Making Amount and/or Complexity of Data Reviewed Labs: ordered. Radiology: ordered.  Risk Prescription drug management. Decision regarding hospitalization.   This patient presents to the ED for concern of multiple complaints, this involves an extensive number of treatment options, and is a complaint that carries with it a high risk of  complications and morbidity.  The differential diagnosis includes dehydration, deconditioning, infection, metabolic derangements   Co morbidities / Chronic conditions that complicate the patient evaluation  HTN, HLD, depression, OSA, CAD, nephrolithiasis   Additional history obtained:  Additional history obtained from EMR External records from  outside source obtained and reviewed including N/A   Lab Tests:  I Ordered, and personally interpreted labs.  The pertinent results include: AKI, leukocytosis, elevations in troponin and BNP.   Imaging Studies ordered:  I ordered imaging studies including CT of chest, abdomen, pelvis I independently visualized and interpreted imaging which showed no acute findings I agree with the radiologist interpretation   Cardiac Monitoring: / EKG:  The patient was maintained on a cardiac monitor.  I personally viewed and interpreted the cardiac monitored which showed an underlying rhythm of: Sinus rhythm   Problem List / ED Course / Critical interventions / Medication management  Patient presenting for multiple complaints.  A week ago, she developed URI symptoms.  She has had ongoing cough and congestion since that time.  She has some left flank pain which resolved.  She has had very poor p.o. intake for the past week.  On arrival, vital signs are notable for hypertension.  Patient is overall well-appearing on exam.  Her breathing is unlabored.  SpO2 is normal on room air.  She denies any areas of discomfort at this time.  IV fluid bolus was ordered.  Workup was initiated.  Lab work notable for AKI, leukocytosis, as well as elevations in troponin and BNP.  This does raise concern for possible cardiorenal syndrome, however, patient does not seem fluid overloaded on exam.  She did undergo a CT scan of chest, abdomen, pelvis.  Results did not show any acute findings.  Her blood pressure remains elevated.  Home blood pressure medications were ordered to  minimize afterload and cardiac demand.  I spoke with cardiologist on-call, Dr. Marlyn, who recommends admission here at [facility].  He agrees with gentle IV hydration for now.  Troponins to be trended.  If they do markedly increase, heparin  to be initiated.  He recommends echocardiogram and cardiology consultation in the morning.  Recommendations were relayed to hospitalist.  Patient was admitted for further management. I ordered medication including IV fluid for hydration, Imdur , NTG, lisinopril  for HTN Reevaluation of the patient after these medicines showed that the patient improved I have reviewed the patients home medicines and have made adjustments as needed   Consultations Obtained:  I requested consultation with the cardiologist, Dr. Marlyn,  and discussed lab and imaging findings as well as pertinent plan - they recommend: Admission to [facility], continue to trend troponins, initiate heparin  if troponins continue to elevate.  Echocardiogram cardiology consultation in the morning.   Social Determinants of Health:  Lives independently     Final diagnoses:  AKI (acute kidney injury)  Elevated troponin    ED Discharge Orders     None          Melvenia Motto, MD 06/07/24 1951

## 2024-06-07 NOTE — Assessment & Plan Note (Signed)
 Patient with a multitude of symptoms including flank pain.  No flank pain currently.  Her symptoms are concerning.  Needs labs and likely needs fluids.  Suspect that she is volume depleted.  We discussed work up in the outpatient setting versus going to the hospital.  She elected for the latter.  Patient's husband is taking her via private vehicle.

## 2024-06-08 ENCOUNTER — Other Ambulatory Visit (HOSPITAL_COMMUNITY): Payer: Self-pay | Admitting: *Deleted

## 2024-06-08 ENCOUNTER — Inpatient Hospital Stay (HOSPITAL_COMMUNITY)

## 2024-06-08 DIAGNOSIS — N179 Acute kidney failure, unspecified: Secondary | ICD-10-CM | POA: Diagnosis not present

## 2024-06-08 DIAGNOSIS — I214 Non-ST elevation (NSTEMI) myocardial infarction: Secondary | ICD-10-CM

## 2024-06-08 DIAGNOSIS — I350 Nonrheumatic aortic (valve) stenosis: Secondary | ICD-10-CM | POA: Diagnosis not present

## 2024-06-08 DIAGNOSIS — I2583 Coronary atherosclerosis due to lipid rich plaque: Secondary | ICD-10-CM

## 2024-06-08 DIAGNOSIS — I160 Hypertensive urgency: Secondary | ICD-10-CM | POA: Diagnosis not present

## 2024-06-08 DIAGNOSIS — E7800 Pure hypercholesterolemia, unspecified: Secondary | ICD-10-CM

## 2024-06-08 DIAGNOSIS — I2510 Atherosclerotic heart disease of native coronary artery without angina pectoris: Secondary | ICD-10-CM

## 2024-06-08 DIAGNOSIS — R7989 Other specified abnormal findings of blood chemistry: Secondary | ICD-10-CM | POA: Diagnosis not present

## 2024-06-08 LAB — BASIC METABOLIC PANEL WITH GFR
Anion gap: 11 (ref 5–15)
BUN: 19 mg/dL (ref 8–23)
CO2: 23 mmol/L (ref 22–32)
Calcium: 9.7 mg/dL (ref 8.9–10.3)
Chloride: 103 mmol/L (ref 98–111)
Creatinine, Ser: 2.34 mg/dL — ABNORMAL HIGH (ref 0.44–1.00)
GFR, Estimated: 20 mL/min — ABNORMAL LOW (ref >60)
Glucose, Bld: 88 mg/dL (ref 70–99)
Potassium: 4.4 mmol/L (ref 3.5–5.1)
Sodium: 137 mmol/L (ref 135–145)

## 2024-06-08 LAB — CBC
HCT: 34.7 % — ABNORMAL LOW (ref 36.0–46.0)
Hemoglobin: 11.5 g/dL — ABNORMAL LOW (ref 12.0–15.0)
MCH: 30.7 pg (ref 26.0–34.0)
MCHC: 33.1 g/dL (ref 30.0–36.0)
MCV: 92.8 fL (ref 80.0–100.0)
Platelets: 315 K/uL (ref 150–400)
RBC: 3.74 MIL/uL — ABNORMAL LOW (ref 3.87–5.11)
RDW: 13.8 % (ref 11.5–15.5)
WBC: 13.1 K/uL — ABNORMAL HIGH (ref 4.0–10.5)
nRBC: 0 % (ref 0.0–0.2)

## 2024-06-08 LAB — ECHOCARDIOGRAM COMPLETE
AR max vel: 1.14 cm2
AV Area VTI: 1.06 cm2
AV Area mean vel: 1.12 cm2
AV Mean grad: 13 mmHg
AV Peak grad: 23.2 mmHg
Ao pk vel: 2.41 m/s
Area-P 1/2: 4.06 cm2
Height: 62 in
MV M vel: 4.78 m/s
MV Peak grad: 91.4 mmHg
Radius: 0.6 cm
S' Lateral: 3.85 cm
Weight: 3488.56 [oz_av]

## 2024-06-08 LAB — URINALYSIS, ROUTINE W REFLEX MICROSCOPIC
Bilirubin Urine: NEGATIVE
Glucose, UA: NEGATIVE mg/dL
Ketones, ur: 5 mg/dL — AB
Nitrite: NEGATIVE
Protein, ur: 30 mg/dL — AB
Specific Gravity, Urine: 1.013 (ref 1.005–1.030)
WBC, UA: >50 WBC/hpf (ref 0–5)
pH: 5 (ref 5.0–8.0)

## 2024-06-08 LAB — MRSA NEXT GEN BY PCR, NASAL: MRSA by PCR Next Gen: NOT DETECTED

## 2024-06-08 LAB — TROPONIN T, HIGH SENSITIVITY: Troponin T High Sensitivity: 678 ng/L (ref 0–19)

## 2024-06-08 LAB — HEPARIN LEVEL (UNFRACTIONATED): Heparin Unfractionated: 0.55 [IU]/mL (ref 0.30–0.70)

## 2024-06-08 MED ORDER — GUAIFENESIN 100 MG/5ML PO LIQD
5.0000 mL | ORAL | Status: DC | PRN
  Administered 2024-06-08 – 2024-06-09 (×2): 5 mL via ORAL
  Filled 2024-06-08 (×2): qty 5

## 2024-06-08 MED ORDER — HEPARIN BOLUS VIA INFUSION
4000.0000 [IU] | Freq: Once | INTRAVENOUS | Status: AC
  Administered 2024-06-08: 4000 [IU] via INTRAVENOUS
  Filled 2024-06-08: qty 4000

## 2024-06-08 MED ORDER — MELATONIN 5 MG PO TABS: 5.0000 mg | ORAL_TABLET | Freq: Every evening | ORAL | Status: DC | PRN

## 2024-06-08 MED ORDER — SODIUM CHLORIDE 0.9 % IV SOLN: INTRAVENOUS | Status: AC

## 2024-06-08 MED ORDER — HEPARIN (PORCINE) 25000 UT/250ML-% IV SOLN
900.0000 [IU]/h | INTRAVENOUS | Status: AC
Start: 2024-06-08 — End: 2024-06-10
  Administered 2024-06-08 – 2024-06-09 (×2): 900 [IU]/h via INTRAVENOUS
  Filled 2024-06-08 (×2): qty 250

## 2024-06-08 MED ORDER — MELATONIN 3 MG PO TABS
3.0000 mg | ORAL_TABLET | Freq: Every evening | ORAL | Status: DC | PRN
Start: 2024-06-08 — End: 2024-06-11
  Administered 2024-06-08 – 2024-06-10 (×3): 3 mg via ORAL
  Filled 2024-06-08 (×3): qty 1

## 2024-06-08 MED ORDER — ALUM & MAG HYDROXIDE-SIMETH 200-200-20 MG/5ML PO SUSP
30.0000 mL | Freq: Four times a day (QID) | ORAL | Status: DC | PRN
  Administered 2024-06-08 – 2024-06-09 (×2): 30 mL via ORAL
  Filled 2024-06-08 (×2): qty 30

## 2024-06-08 NOTE — Progress Notes (Signed)
 PHARMACY - ANTICOAGULATION  Pharmacy Consult for Heparin  Indication: chest pain/ACS Brief A/P: Heparin  level within goal range Continue Heparin  at current rate   No Known Allergies  Patient Measurements: Height: 5' 2 (157.5 cm) Weight: 98.9 kg (218 lb 0.6 oz) IBW/kg (Calculated) : 50.1 HEPARIN  DW (KG): 73.5  Vital Signs: Temp: 98 F (36.7 C) (10/16 2000) Temp Source: Axillary (10/16 2000) BP: 103/74 (10/16 2000) Pulse Rate: 62 (10/16 2000)  Labs: Recent Labs    06/07/24 1700 06/08/24 0430 06/08/24 1956  HGB 13.4 11.5*  --   HCT 40.3 34.7*  --   PLT 351 315  --   HEPARINUNFRC  --   --  0.55  CREATININE 2.63* 2.34*  --     Estimated Creatinine Clearance: 21.1 mL/min (A) (by C-G formula based on SCr of 2.34 mg/dL (H)).  Assessment: 78 y.o. female with ACS for heparin    Goal of Therapy:  Heparin  level 0.3-0.7 units/ml Monitor platelets by anticoagulation protocol: Yes   Plan:  No change to heparin  Follow-up am labs.  Cathlyn Arrant, PharmD, BCPS  06/08/2024 11:30 PM

## 2024-06-08 NOTE — Progress Notes (Signed)
 PROGRESS NOTE    ADREONA BRAND  FMW:[id number] DOB: 08-15-1946 DOA: 06/07/2024 PCP: Cook, Jayce G, DO   Brief Narrative:   Lacey Bailey is a 78 y.o. female with medical history significant for CABG, hypertension, OSA, hypothyroidism, depression. Patient went to see her outpatient provider, with multiple complaints, she was referred to the ED.  Her complaints included dizziness, cough, fatigue, difficulty breathing, left flank pain, reduced oral intake, and overall feeling unwell over the past 1 week.  No vomiting no loose stools.  She reports chronic chest pains, her last chest pain was yesterday, described as pressure-like, she took a dose of nitroglycerin  and felt better.  She takes nitro about 2-3 times a week for same symptoms.  No chest pain today.   ED Course: Blood pressure elevated more than 170s in the ER.  Troponin 641>> 598. Elevated at 2.63. COVID influenza RSV negative. TSH 20.2 proBNP 4551. Patient admitted for unusual angina/non-STEMI    Assessment & Plan:   Principal Problem:   AKI (acute kidney injury) Active Problems:   NSTEMI (non-ST elevated myocardial infarction) (HCC)   Hypothyroidism   Essential hypertension   S/P CABG (coronary artery bypass graft)   Assessment and Plan:   AKI-creatinine 2.63, baseline 1-1.3.  In the setting of poor oral intake over the past week. - Received 1.5 L fluid.  Creatinine slightly better today at 2.3.  Will encourage oral intake - Hold low-dose lisinopril  5 mg daily   NSTEMI with history of CABG 2003 with multiple PCI's-chest pain resolved with nitroglycerin .  Chronic chest pains, typically takes nitroglycerin  2-3 times a week.  Follows with Dr. Debera.  Most recent angiography 2019 showed continued patent LIMA to LAD with occlusion of all vein grafts as well as native circumflex and RCA.  Redo CABG was not recommended at that time and revascularization options are limited.  Last echo 06/2023 EF of 60 to 65%, G1DD,  moderate aortic stenosis. -Troponin 641>> 598.  With elevated BNP at 4551  -Cardiology on board.  Continue medical therapy -Titrate down nicardipine.  Continue aspirin , beta-blocker, nitrate - Started on IV heparin  with bump in Troponin this morning   Elevated blood pressure-systolic up to 170s. - Continue nitroglycerin  - Holding low-dose lisinopril  with AKI   Left flank pain-resolved, CT chest abdomen pelvis without contrast negative for acute abnormality. UA concerning for UTI.  Will send for culture. Monitor off antibiotics   Moderate aortic stenosis   Hypothyroidism-TSH elevated at 20.2. - Check T3, free T4 - Resume Synthroid  75 mg daily     DVT prophylaxis: Heparin  Maury Code Status: Full code,  Family Communication: Disposition Plan: ~ 2 days Consults called: Cardiology Admission status: Inpatient, stepdown I certify that at the point of admission it is my clinical judgment that the patient will require inpatient hospital care spanning beyond 2 midnights from the point of admission due to high intensity of service, high risk for further deterioration and high frequency of surveillance required.      Subjective:  Patient seen and examined at the bedside.  She denies any chest pain today.  Denies nausea vomiting, shortness of breath.  She is off nitro drip.  Objective: Vitals:   06/08/24 0600 06/08/24 0700 06/08/24 0730 06/08/24 0800  BP: (!) 163/70 (!) 165/63 130/71 (!) 177/73  Pulse: 74 76 76 71  Resp: 17 20 18    Temp:      TempSrc:      SpO2: 96% 96% 99% 98%  Weight:  Height:        Intake/Output Summary (Last 24 hours) at 06/08/2024 0830 Last data filed at 06/08/2024 9367 Gross per 24 hour  Intake 1474.57 ml  Output 400 ml  Net 1074.57 ml   Filed Weights   06/07/24 1550 06/07/24 2100  Weight: 100 kg 98.9 kg    Examination:     Data Reviewed: I have personally reviewed following labs and imaging studies  CBC: Recent Labs  Lab 06/07/24 1700  06/08/24 0430  WBC 15.1* 13.1*  NEUTROABS 11.2*  --   HGB 13.4 11.5*  HCT 40.3 34.7*  MCV 92.6 92.8  PLT 351 315   Basic Metabolic Panel: Recent Labs  Lab 06/07/24 1700 06/08/24 0430  NA 136 137  K 3.8 4.4  CL 99 103  CO2 21* 23  GLUCOSE 110* 88  BUN 21 19  CREATININE 2.63* 2.34*  CALCIUM  10.3 9.7  MG 1.8  --    GFR: Estimated Creatinine Clearance: 21.1 mL/min (A) (by C-G formula based on SCr of 2.34 mg/dL (H)). Liver Function Tests: Recent Labs  Lab 06/07/24 1700  AST 77*  ALT 41  ALKPHOS 93  BILITOT 1.4*  PROT 7.9  ALBUMIN 4.5   Recent Labs  Lab 06/07/24 1700  LIPASE 14   No results for input(s): AMMONIA in the last 168 hours. Coagulation Profile: No results for input(s): INR, PROTIME in the last 168 hours. Cardiac Enzymes: No results for input(s): CKTOTAL, CKMB, CKMBINDEX, TROPONINI in the last 168 hours. BNP (last 3 results) Recent Labs    06/07/24 1700  PROBNP 4,551.0*   HbA1C: No results for input(s): HGBA1C in the last 72 hours. CBG: No results for input(s): GLUCAP in the last 168 hours. Lipid Profile: No results for input(s): CHOL, HDL, LDLCALC, TRIG, CHOLHDL, LDLDIRECT in the last 72 hours. Thyroid  Function Tests: Recent Labs    06/07/24 1700  TSH 20.200*   Anemia Panel: No results for input(s): VITAMINB12, FOLATE, FERRITIN, TIBC, IRON, RETICCTPCT in the last 72 hours. Sepsis Labs: No results for input(s): PROCALCITON, LATICACIDVEN in the last 168 hours.  Recent Results (from the past 240 hours)  Resp panel by RT-PCR (RSV, Flu A&B, Covid) Anterior Nasal Swab     Status: None   Collection Time: 06/07/24  7:28 PM   Specimen: Anterior Nasal Swab  Result Value Ref Range Status   SARS Coronavirus 2 by RT PCR NEGATIVE NEGATIVE Final    Comment: (NOTE) SARS-CoV-2 target nucleic acids are NOT DETECTED.  The SARS-CoV-2 RNA is generally detectable in upper respiratory specimens during the  acute phase of infection. The lowest concentration of SARS-CoV-2 viral copies this assay can detect is 138 copies/mL. A negative result does not preclude SARS-Cov-2 infection and should not be used as the sole basis for treatment or other patient management decisions. A negative result may occur with  improper specimen collection/handling, submission of specimen other than nasopharyngeal swab, presence of viral mutation(s) within the areas targeted by this assay, and inadequate number of viral copies(<138 copies/mL). A negative result must be combined with clinical observations, patient history, and epidemiological information. The expected result is Negative.  Fact Sheet for Patients:  BloggerCourse.com  Fact Sheet for Healthcare Providers:  SeriousBroker.it  This test is no t yet approved or cleared by the [place]  FDA and  has been authorized for detection and/or diagnosis of SARS-CoV-2 by FDA under an Emergency Use Authorization (EUA). This EUA will remain  in effect (meaning this test can be used) for  the duration of the COVID-19 declaration under Section 564(b)(1) of the Act, 21 U.S.C.section 360bbb-3(b)(1), unless the authorization is terminated  or revoked sooner.       Influenza A by PCR NEGATIVE NEGATIVE Final   Influenza B by PCR NEGATIVE NEGATIVE Final    Comment: (NOTE) The Xpert Xpress SARS-CoV-2/FLU/RSV plus assay is intended as an aid in the diagnosis of influenza from Nasopharyngeal swab specimens and should not be used as a sole basis for treatment. Nasal washings and aspirates are unacceptable for Xpert Xpress SARS-CoV-2/FLU/RSV testing.  Fact Sheet for Patients: BloggerCourse.com  Fact Sheet for Healthcare Providers: SeriousBroker.it  This test is not yet approved or cleared by the [place]  FDA and has been authorized for detection and/or  diagnosis of SARS-CoV-2 by FDA under an Emergency Use Authorization (EUA). This EUA will remain in effect (meaning this test can be used) for the duration of the COVID-19 declaration under Section 564(b)(1) of the Act, 21 U.S.C. section 360bbb-3(b)(1), unless the authorization is terminated or revoked.     Resp Syncytial Virus by PCR NEGATIVE NEGATIVE Final    Comment: (NOTE) Fact Sheet for Patients: BloggerCourse.com  Fact Sheet for Healthcare Providers: SeriousBroker.it  This test is not yet approved or cleared by the [place]  FDA and has been authorized for detection and/or diagnosis of SARS-CoV-2 by FDA under an Emergency Use Authorization (EUA). This EUA will remain in effect (meaning this test can be used) for the duration of the COVID-19 declaration under Section 564(b)(1) of the Act, 21 U.S.C. section 360bbb-3(b)(1), unless the authorization is terminated or revoked.  Performed at [facility], [address] [place], [place] [address]   MRSA Next Gen by PCR, Nasal     Status: None   Collection Time: 06/07/24  9:03 PM   Specimen: Nasal Mucosa; Nasal Swab  Result Value Ref Range Status   MRSA by PCR Next Gen NOT DETECTED NOT DETECTED Final    Comment: (NOTE) The GeneXpert MRSA Assay (FDA approved for NASAL specimens only), is one component of a comprehensive MRSA colonization surveillance program. It is not intended to diagnose MRSA infection nor to guide or monitor treatment for MRSA infections. Test performance is not FDA approved in patients less than 32 years old. Performed at [facility], [address], [place], [place] [address]          Radiology Studies: CT CHEST ABDOMEN PELVIS WO CONTRAST Result Date: 06/07/2024 CLINICAL DATA:  Unintended weight loss.  Fatigue.  Left flank pain. EXAM: CT CHEST, ABDOMEN AND PELVIS WITHOUT CONTRAST TECHNIQUE: Multidetector CT imaging of the chest, abdomen and pelvis  was performed following the standard protocol without IV contrast. RADIATION DOSE REDUCTION: This exam was performed according to the departmental dose-optimization program which includes automated exposure control, adjustment of the mA and/or kV according to patient size and/or use of iterative reconstruction technique. COMPARISON:  None Available. FINDINGS: CT CHEST FINDINGS Cardiovascular: Diffuse coronary artery and aortic atherosclerosis. Prior CABG. Heart is normal size. Aorta is normal caliber. Mediastinum/Nodes: No mediastinal, hilar, or axillary adenopathy. Trachea and esophagus are unremarkable. Thyroid  unremarkable. Lungs/Pleura: No confluent airspace opacities or effusions. Musculoskeletal: Chest wall soft tissues are unremarkable. No acute bony abnormality. CT ABDOMEN PELVIS FINDINGS Hepatobiliary: No focal liver abnormality is seen. Status post cholecystectomy. No biliary dilatation. Pancreas: No focal abnormality or ductal dilatation. Spleen: No focal abnormality.  Normal size. Adrenals/Urinary Tract: No adrenal abnormality. No focal renal abnormality. No stones or hydronephrosis. Urinary bladder is unremarkable. Stomach/Bowel: Stomach, large and  small bowel grossly unremarkable. Vascular/Lymphatic: Diffuse aortoiliac atherosclerosis. No evidence of aneurysm or adenopathy. Reproductive: Prior hysterectomy.  No adnexal masses. Other: No free fluid or free air. Musculoskeletal: No acute bony abnormality. IMPRESSION: No acute findings in the chest, abdomen or pelvis. Diffuse aortoiliac atherosclerosis. Coronary artery disease, status post CABG. Electronically Signed   By: Franky Crease M.D.   On: 06/07/2024 18:43        Scheduled Meds:  aspirin  EC  81 mg Oral QHS   bisoprolol   2.5 mg Oral Daily   Chlorhexidine  Gluconate Cloth  6 each Topical Q0600   heparin   5,000 Units Subcutaneous Q8H   isosorbide  mononitrate  30 mg Oral BID   levothyroxine   75 mcg Oral QAC breakfast   PARoxetine   40 mg  Oral QHS   rosuvastatin   20 mg Oral q AM   Continuous Infusions:  sodium chloride  100 mL/hr at 06/08/24 0713   nitroGLYCERIN  20 mcg/min (06/08/24 9367)          Derryl Duval, MD [facility] 06/08/2024, 8:30 AM

## 2024-06-08 NOTE — Progress Notes (Signed)
*  PRELIMINARY RESULTS* Echocardiogram 2D Echocardiogram has been performed.  Lacey Bailey 06/08/2024, 1:54 PM

## 2024-06-08 NOTE — Consult Note (Signed)
 Cardiology Consultation   Patient ID: Lacey Bailey MRN: [id number]; DOB: [birth year]  Admit date: 06/07/2024 Date of Consult: 06/08/2024  PCP:  Bluford Jacqulyn MATSU, DO   [facility] Providers Cardiologist:  Jayson Sierras, MD        Patient Profile: Lacey Bailey is a 78 y.o. female with a hx of CAD (s/p CABG in 2003, multiple interventions since at outside hospitals and most recent DES to PLB in 11/2014, angioplasty to mid-RCA and mid-LCx in 10/2015 with patent LIMA-LAD and occlusion of all vein grafts, cath in 12/2017 showing patent LIMA-LAD with occlusion of all vein grafts and occlusion of previously placed LCx and RCA stents with collaterals present --> not a candidate for re-do CABG), HTN, HLD, hypothyroidism, aortic stenosis and prior TIA who is being seen 06/08/2024 for the evaluation of NSTEMI at the request of Dr. Mcarthur.  History of Present Illness:  [name] was last examined by Dr. Sierras in 02/2024 and reported variable fatigue but no accelerating anginal symptoms. She was continued on her current medical therapy with ASA 81 mg daily, Bisoprolol  2.5 mg daily, Imdur  30 mg twice daily, Lisinopril  5 mg daily and Crestor  20 mg daily.  She presented to [facility] ED on 06/07/2024 after being evaluated by her PCP and reported worsening fatigue and left flank pain with concerns for dehydration. In talking with the patient today, she reports having sinus congestion and phlegm for the past week. Appetite was decreased during that timeframe. She reports having chronic angina and typically has to take 1-2 SL NTG per week. Activity is overall limited given her angina and her husband helps with most of the chores around the home. She mentions she was having more frequent episodes of chest pain yesterday but now improved. No recent orthopnea, PND or pitting edema.   Initial labs showed WBC 15.1, Hgb 13.4, platelets 351, Na+ 136, K+ 3.8 and creatinine 2.63 (at 1.06 in 07/2023).  Magnesium 1.8. TSH 20.200.  Initial troponin T at 641 with repeat at 598. CT Abdomen showed no acute findings. EKG shows NSR, HR 73 with LVH and diffuse ST abnormalities along the anterior and lateral leads which is likely due to repolarization and similar to prior tracings.  She was started on IV fluids along with a nitroglycerin  drip. Continued on ASA 81 mg daily, Bisoprolol  2.5 mg daily, Imdur  30mg  BID and Crestor  20 mg daily. Repeat labs today show creatinine is slowly improving to 2.34.  WBC at 13.1.  Urinalysis concerning for UTI.   Past Medical History:  Diagnosis Date   Aortic sclerosis    CAD (coronary artery disease)    a. s/p CABG in 2003 b. multiple interventions since at outside hospitals c. DES to PLB in 11/2014 d. angioplasty to mid-RCA and mid-LCx in 10/2015 with patent LIMA-LAD and occlusion of all vein grafts e. 12/2017: occlusion of vein grafts and native LCx and RCA with patent LIMA-LAD. Re-do CABG NOT recommended by CT surgery   Depression    Essential hypertension    History of kidney stones    Hyperlipidemia    Hypothyroidism    Sleep apnea    TIA (transient ischemic attack) ~ 2013    Past Surgical History:  Procedure Laterality Date   ABDOMINAL HYSTERECTOMY     APPENDECTOMY     BLEPHAROPLASTY Bilateral    uppers   BOTOX  INJECTION N/A 03/23/2022   Procedure: BOTOX  INJECTION- 100 units;  Surgeon: Sherrilee Belvie CROME, MD;  Location: [facility];  Service: Urology;  Laterality: N/A;   CARDIAC CATHETERIZATION  11/22/2015   CARDIAC CATHETERIZATION N/A 11/22/2015   Procedure: Left Heart Cath and Cors/Grafts Angiography;  Surgeon: Alm LELON Clay, MD;  Location: [facility] INVASIVE CV LAB;  Service: Cardiovascular;  Laterality: N/A;   CARDIAC CATHETERIZATION N/A 11/22/2015   Procedure: Coronary Stent Intervention;  Surgeon: Alm LELON Clay, MD;  Location: [facility] INVASIVE CV LAB;  Service: Cardiovascular;  Laterality: N/A;   CATARACT EXTRACTION, BILATERAL Bilateral    CESAREAN SECTION   1977   CORONARY ANGIOPLASTY WITH STENT PLACEMENT  several   last one was 11/2014:  DES to the posterolateral branch /notes 11/20/2015   CORONARY ARTERY BYPASS GRAFT  2003   [place] ; CABG X 4   CYSTOSCOPY WITH INJECTION N/A 03/23/2022   Procedure: CYSTOSCOPY WITH INJECTION;  Surgeon: Sherrilee Belvie CROME, MD;  Location: [facility];  Service: Urology;  Laterality: N/A;   DILATION AND CURETTAGE OF UTERUS     KIDNEY STONE SURGERY     opened me up   LAPAROSCOPIC CHOLECYSTECTOMY     LEFT HEART CATH AND CORS/GRAFTS ANGIOGRAPHY N/A 01/03/2018   Procedure: LEFT HEART CATH AND CORS/GRAFTS ANGIOGRAPHY;  Surgeon: [name], Peter M, MD;  Location: [facility] INVASIVE CV LAB;  Service: Cardiovascular;  Laterality: N/A;   TONSILLECTOMY     TUBAL LIGATION       Home Medications:  Prior to Admission medications   Medication Sig Start Date End Date Taking? Authorizing Provider  acetaminophen  (TYLENOL ) 500 MG tablet Take 1,000 mg by mouth every 8 (eight) hours as needed for moderate pain.   Yes [provider]  aspirin  81 MG tablet Take 81 mg by mouth at bedtime.    Yes [provider]  bisoprolol  (ZEBETA ) 5 MG tablet Take 0.5 tablets (2.5 mg total) by mouth daily. 01/08/21  Yes Debera Jayson MATSU, MD  guaiFENesin (MUCINEX) 600 MG 12 hr tablet Take 600 mg by mouth 2 (two) times daily as needed for cough or to loosen phlegm.   Yes [provider]  isosorbide  mononitrate (IMDUR ) 30 MG 24 hr tablet TAKE 1 TABLET BY MOUTH TWICE DAILY 05/10/24  Yes Debera Jayson MATSU, MD  levothyroxine  (SYNTHROID ) 75 MCG tablet TAKE 1 TABLET BY MOUTH EVERY DAY BEFORE BREAKFAST Patient taking differently: Take 75 mcg by mouth daily before breakfast. TAKE 1 TABLET BY MOUTH EVERY DAY BEFORE BREAKFAST 11/04/20  Yes Waddell, Malena M, DO  lisinopril  (ZESTRIL ) 5 MG tablet Take 1 tablet (5 mg total) by mouth daily. 11/04/20  Yes Waddell, Malena M, DO  loratadine (CLARITIN) 10 MG tablet Take 10 mg by mouth daily as needed for  allergies.   Yes [provider]  Melatonin 1 MG CHEW Chew 1 tablet by mouth at bedtime as needed (sleep).   Yes [provider]  nitroGLYCERIN  (NITROSTAT ) 0.4 MG SL tablet DISSOLVE 1 TABLET UNDER THE TONGUE EVERY 5 MINUTES AS NEEDED FOR CHEST PAIN. DO NOT EXCEED A TOTAL OF 3 DOSES IN 15 MINUTES. Patient taking differently: Place 0.4 mg under the tongue every 5 (five) minutes as needed for chest pain. 05/22/24  Yes Debera Jayson MATSU, MD  PARoxetine  (PAXIL ) 40 MG tablet Take 1 tablet (40 mg total) by mouth daily. Patient taking differently: Take 40 mg by mouth at bedtime. 06/07/24  Yes Cook, Jayce G, DO  rosuvastatin  (CRESTOR ) 20 MG tablet Take 1 tablet (20 mg total) by mouth in the morning. 11/04/20  Yes Taylor, Malena M, DO  SODIUM FLUORIDE 5000 PPM 1.1 %  GEL dental gel Place 1 Application onto teeth in the morning and at bedtime. 12/08/22  Yes [provider]    Scheduled Meds:  aspirin  EC  81 mg Oral QHS   bisoprolol   2.5 mg Oral Daily   Chlorhexidine  Gluconate Cloth  6 each Topical Q0600   heparin   5,000 Units Subcutaneous Q8H   isosorbide  mononitrate  30 mg Oral BID   levothyroxine   75 mcg Oral QAC breakfast   PARoxetine   40 mg Oral QHS   rosuvastatin   20 mg Oral q AM   Continuous Infusions:  sodium chloride  100 mL/hr at 06/08/24 0713   nitroGLYCERIN  20 mcg/min (06/08/24 0632)   PRN Meds: acetaminophen  **OR** acetaminophen , ondansetron  **OR** ondansetron  (ZOFRAN ) IV, polyethylene glycol  Allergies:   No Known Allergies  Social History:   Social History   Socioeconomic History   Marital status: Married    Spouse name: Not on file   Number of children: Not on file   Years of education: Not on file   Highest education level: Not on file  Occupational History   Not on file  Tobacco Use   Smoking status: Never   Smokeless tobacco: Never  Vaping Use   Vaping status: Never Used  Substance and Sexual Activity   Alcohol use: Not Currently     Alcohol/week: 0.0 standard drinks of alcohol    Comment: 11/22/2015 nothing since ~ 2005; occasionally had a drink w/dinner before 2005   Drug use: No   Sexual activity: Not Currently  Other Topics Concern   Not on file  Social History Narrative   Not on file   Social Drivers of Health   Financial Resource Strain: Not on file  Food Insecurity: No Food Insecurity (06/07/2024)   Hunger Vital Sign    Worried About Running Out of Food in the Last Year: Never true    Ran Out of Food in the Last Year: Never true  Transportation Needs: No Transportation Needs (06/07/2024)   PRAPARE - Administrator, Civil Service (Medical): No    Lack of Transportation (Non-Medical): No  Physical Activity: Not on file  Stress: Not on file  Social Connections: Moderately Integrated (06/07/2024)   Social Connection and Isolation Panel    Frequency of Communication with Friends and Family: More than three times a week    Frequency of Social Gatherings with Friends and Family: Once a week    Attends Religious Services: 1 to 4 times per year    Active Member of [employer] or Organizations: No    Attends Banker Meetings: Never    Marital Status: Married  Catering manager Violence: Not At Risk (06/07/2024)   Humiliation, Afraid, Rape, and Kick questionnaire    Fear of Current or Ex-Partner: No    Emotionally Abused: No    Physically Abused: No    Sexually Abused: No    Family History:    Family History  Problem Relation Age of Onset   Heart attack Mother    Heart disease Mother    Heart disease Brother      ROS:  Please see the history of present illness.   All other ROS reviewed and negative.     Physical Exam/Data: Vitals:   06/08/24 0600 06/08/24 0700 06/08/24 0730 06/08/24 0800  BP: (!) 163/70 (!) 165/63 130/71 (!) 177/73  Pulse: 74 76 76 71  Resp: 17 20 18    Temp:      TempSrc:  SpO2: 96% 96% 99% 98%  Weight:      Height:        Intake/Output Summary  (Last 24 hours) at 06/08/2024 0829 Last data filed at 06/08/2024 9367 Gross per 24 hour  Intake 1474.57 ml  Output 400 ml  Net 1074.57 ml      06/07/2024    9:00 PM 06/07/2024    3:50 PM 03/15/2024    1:29 PM  Last 3 Weights  Weight (lbs) 218 lb 0.6 oz 220 lb 7.4 oz 220 lb 6.4 oz  Weight (kg) 98.9 kg 100 kg 99.973 kg     Body mass index is 39.88 kg/m.  General:  Pleasant female appearing in no acute distress HEENT: normal Neck: no JVD Vascular: No carotid bruits; Distal pulses 2+ bilaterally Cardiac:  normal S1, S2; RRR; 3/6 SEM along RUSB.  Lungs:  clear to auscultation bilaterally, no wheezing, rhonchi or rales  Abd: soft, nontender, no hepatomegaly  Ext: no pitting edema Musculoskeletal:  No deformities, BUE and BLE strength normal and equal Skin: warm and dry  Neuro:  CNs 2-12 intact, no focal abnormalities noted Psych:  Normal affect   EKG:  The EKG was personally reviewed and demonstrates: NSR, HR 73 with LVH and diffuse ST abnormalities along the anterior and lateral leads which is likely due to repolarization and similar to prior tracings.  Telemetry:  Telemetry was personally reviewed and demonstrates: NSR, HR in 70's to 80's with occasional PVC's.   Relevant CV Studies:  Cardiac Catheterization: 12/2017 Post Atrio lesion is 20% stenosed. Prox RCA to Mid RCA lesion is 100% stenosed. Prox Cx to Dist Cx lesion is 100% stenosed. Ost LAD to Prox LAD lesion is 100% stenosed. LIMA graft was visualized by angiography and is large. The graft exhibits no disease. SVG graft was visualized by angiography. SVG graft was visualized by angiography. SVG graft was visualized by angiography. Origin lesion is 100% stenosed. Origin to Prox Graft lesion is 100% stenosed. Origin to Prox Graft lesion is 100% stenosed. The left ventricular systolic function is normal. LV end diastolic pressure is normal. The left ventricular ejection fraction is 55-65% by visual estimate.   1.  Severe 3 vessel occlusive CAD 2. Patent LIMA to the LAD 3. Occluded SVG to diagonal 4. Occluded SVG to the OM2 5. Occluded SVG to the PDA 6. Good LV function 7. Normal LVEDP   Plan: Compared to prior cardiac cath in March 2017 the stents in the mid to distal LCx and mid RCA are completely occluded. Given extensive work done on these vessels previously she is not a candidate for CTO PCI. The only consideration would be whether she is a candidate for redo CABG. Her RCA appears graftable. Based on old films the OM2 may be graftable. I would obtain surgical consultation. Will stop Plavix . Continue other medical therapy.  Echocardiogram: 06/2023 IMPRESSIONS     1. Left ventricular ejection fraction, by estimation, is 60 to 65%. The  left ventricle has normal function. The left ventricle has no regional  wall motion abnormalities. There is moderate left ventricular hypertrophy.  Left ventricular diastolic  parameters are consistent with Grade I diastolic dysfunction (impaired  relaxation). Elevated left atrial pressure.   2. Right ventricular systolic function is normal. The right ventricular  size is normal.   3. The mitral valve is normal in structure. Mild mitral valve  regurgitation. No evidence of mitral stenosis.   4. Moderate paradoxical low flow low gradient aortic stenosis. Mean grad  14, AVA VTI 1.22, DI 0.39, SVI 31. The aortic valve is tricuspid. There is  moderate calcification of the aortic valve. There is moderate thickening  of the aortic valve. Aortic  valve regurgitation is not visualized. Moderate aortic valve stenosis.   5. IVC is small suggesting low RA pressure and hypovolemia.   Comparison(s): EF 55-60%. Moderate LVH. Mild TR.    Laboratory Data: High Sensitivity Troponin:  No results for input(s): TROPONINIHS in the last 720 hours.   Chemistry Recent Labs  Lab 06/07/24 1700 06/08/24 0430  NA 136 137  K 3.8 4.4  CL 99 103  CO2 21* 23  GLUCOSE 110* 88   BUN 21 19  CREATININE 2.63* 2.34*  CALCIUM  10.3 9.7  MG 1.8  --   GFRNONAA 18* 20*  ANIONGAP 15 11    Recent Labs  Lab 06/07/24 1700  PROT 7.9  ALBUMIN 4.5  AST 77*  ALT 41  ALKPHOS 93  BILITOT 1.4*   Lipids No results for input(s): CHOL, TRIG, HDL, LABVLDL, LDLCALC, CHOLHDL in the last 168 hours.  Hematology Recent Labs  Lab 06/07/24 1700 06/08/24 0430  WBC 15.1* 13.1*  RBC 4.35 3.74*  HGB 13.4 11.5*  HCT 40.3 34.7*  MCV 92.6 92.8  MCH 30.8 30.7  MCHC 33.3 33.1  RDW 13.7 13.8  PLT 351 315   Thyroid   Recent Labs  Lab 06/07/24 1700  TSH 20.200*    BNP Recent Labs  Lab 06/07/24 1700  PROBNP 4,551.0*    DDimer No results for input(s): DDIMER in the last 168 hours.  Radiology/Studies:  CT CHEST ABDOMEN PELVIS WO CONTRAST Result Date: 06/07/2024 CLINICAL DATA:  Unintended weight loss.  Fatigue.  Left flank pain. EXAM: CT CHEST, ABDOMEN AND PELVIS WITHOUT CONTRAST TECHNIQUE: Multidetector CT imaging of the chest, abdomen and pelvis was performed following the standard protocol without IV contrast. RADIATION DOSE REDUCTION: This exam was performed according to the departmental dose-optimization program which includes automated exposure control, adjustment of the mA and/or kV according to patient size and/or use of iterative reconstruction technique. COMPARISON:  None Available. FINDINGS: CT CHEST FINDINGS Cardiovascular: Diffuse coronary artery and aortic atherosclerosis. Prior CABG. Heart is normal size. Aorta is normal caliber. Mediastinum/Nodes: No mediastinal, hilar, or axillary adenopathy. Trachea and esophagus are unremarkable. Thyroid  unremarkable. Lungs/Pleura: No confluent airspace opacities or effusions. Musculoskeletal: Chest wall soft tissues are unremarkable. No acute bony abnormality. CT ABDOMEN PELVIS FINDINGS Hepatobiliary: No focal liver abnormality is seen. Status post cholecystectomy. No biliary dilatation. Pancreas: No focal abnormality  or ductal dilatation. Spleen: No focal abnormality.  Normal size. Adrenals/Urinary Tract: No adrenal abnormality. No focal renal abnormality. No stones or hydronephrosis. Urinary bladder is unremarkable. Stomach/Bowel: Stomach, large and small bowel grossly unremarkable. Vascular/Lymphatic: Diffuse aortoiliac atherosclerosis. No evidence of aneurysm or adenopathy. Reproductive: Prior hysterectomy.  No adnexal masses. Other: No free fluid or free air. Musculoskeletal: No acute bony abnormality. IMPRESSION: No acute findings in the chest, abdomen or pelvis. Diffuse aortoiliac atherosclerosis. Coronary artery disease, status post CABG. Electronically Signed   By: Franky Crease M.D.   On: 06/07/2024 18:43     Assessment and Plan:  1. NSTEMI - Presented with fatigue and flank pain but does report worsening chest pain the day of admission as well. Initial troponin T at 641 with repeat at 598. EKG shows NSR, HR 73 with LVH and diffuse ST abnormalities along the anterior and lateral leads which is likely due to repolarization and similar to prior tracings. Echo pending  for today.  - She is pain-free currently. Will add-on a repeat Hs Troponin to AM labs. If trending up, would start IV Heparin  and medically treat for 48 hours. Would not anticipate repeat cath regardless of trend given her AKI and recommendations for medical management based off last cath in 2019 as she did not have any PCI options and was evaluated by CT Surgery (noted dated 01/05/2018) and not felt to be a candidate for re-do CABG given poor targets and risk of jeopardizing her mammary artery.  - She is currently on IV NTG. Would wean as able. Scheduled to receive Imdur  30mg  BID and can titrate. Continue ASA 81mg  daily, Bisoprolol  2.5mg  daily and Crestor  20mg  daily (LDL 58 when checked in 07/2023).   2. CAD - She underwent CABG in 2003 with multiple interventions since at outside hospitals and most recent DES to PLB in 11/2014, angioplasty to  mid-RCA and mid-LCx in 10/2015 with patent LIMA-LAD and occlusion of all vein grafts. Most recent cardiac catheterization in 12/2017 showed a patent LIMA-LAD with occlusion of all vein grafts and occlusion of previously placed LCx and RCA stents with collaterals present and she was not felt to be a candidate for redo CABG.  - Continue ASA, Bisoprolol  and Crestor .   3. Accelerated HTN - BP was recorded at 225/206 during ED evaluation but at 160/147 on repeat check. BP has improved this morning with SBP in the 150's. She is currently is on IV nitroglycerin  which can be weaned. Continue Bisoprolol  2.5 mg daily. Continue to wean IV NTG. Given that Lisinopril  is held, may need to consider the use of Amlodipine  for additional blood pressure control in the setting of her AKI.  4. Aortic Stenosis - She had moderate, paradoxical low-flow low-gradient AS by echo in 06/2023. Repeat imaging pending.   5. Hypothyroidism - TSH at 20.200 this admission. She has been continued on Synthroid . Defer dose adjustment to the admitting team.   6. AKI - Baseline creatinine 1.0 - 1.3. At 2.63 on admission and improved to 2.34 today. Receiving IV fluids. Lisinopril  currently held.   For questions or updates, please contact [facility] Please consult [email or web address] for contact info under    Signed, Laymon CHRISTELLA Qua, PA-C  06/08/2024 8:29 AM

## 2024-06-08 NOTE — Plan of Care (Signed)

## 2024-06-08 NOTE — Progress Notes (Signed)
 PHARMACY - ANTICOAGULATION CONSULT NOTE  Pharmacy Consult for Heparin  Indication: chest pain/ACS  No Known Allergies  Patient Measurements: Height: 5' 2 (157.5 cm) Weight: 98.9 kg (218 lb 0.6 oz) IBW/kg (Calculated) : 50.1 HEPARIN  DW (KG): 73.5  Vital Signs: Temp: 98.4 F (36.9 C) (10/16 0800) Temp Source: Oral (10/16 0800) BP: 123/62 (10/16 1030) Pulse Rate: 64 (10/16 1030)  Labs: Recent Labs    06/07/24 1700 06/08/24 0430  HGB 13.4 11.5*  HCT 40.3 34.7*  PLT 351 315  CREATININE 2.63* 2.34*    Estimated Creatinine Clearance: 21.1 mL/min (A) (by C-G formula based on SCr of 2.34 mg/dL (H)).   Medical History: Past Medical History:  Diagnosis Date   Aortic sclerosis    CAD (coronary artery disease)    a. s/p CABG in 2003 b. multiple interventions since at outside hospitals c. DES to PLB in 11/2014 d. angioplasty to mid-RCA and mid-LCx in 10/2015 with patent LIMA-LAD and occlusion of all vein grafts e. 12/2017: occlusion of vein grafts and native LCx and RCA with patent LIMA-LAD. Re-do CABG NOT recommended by CT surgery   Depression    Essential hypertension    History of kidney stones    Hyperlipidemia    Hypothyroidism    Sleep apnea    TIA (transient ischemic attack) ~ 2013   Assessment: 78 year female admitted to [facility] with shortness of breath. Patient with known CAD and now elevated (flat) troponin's in 600-700s. Patient not on anticoagulants prior to admission. New orders to start IV heparin  for ACS treatment.   Goal of Therapy:  Heparin  level 0.3-0.7 units/ml Monitor platelets by anticoagulation protocol: Yes   Plan:  Give 4000 units bolus x 1 Start heparin  infusion at 900 units/hr Check anti-Xa level in 8 hours and daily while on heparin  Continue to monitor H&H and platelets  Dempsey Blush PharmD., BCPS Clinical Pharmacist 06/08/2024 11:56 AM

## 2024-06-08 NOTE — Progress Notes (Signed)
 Pt reported cough, she has been coughing up thick mucus for few days, reported same issue in the past and Mucinex was effective.  She also c/o mild epigastric burning and discomfort associated with cough.  Jesus, Np notified.  New orders placed.

## 2024-06-08 NOTE — Progress Notes (Signed)
 06/08/2024 11:08 AM -----------------------------------------------------------[facility]--------------------------------------------------- D(Data) A(Action) R(response)     Data: Discharge Readiness Assessment EDD currently listed for tomorrow 06/09/2024    Action: Chart reviewed    Response: No immediate Barriers to discharge identified at this time pending clinical progression.     Rue Tinnel, RN [employer]

## 2024-06-08 NOTE — TOC CM/SW Note (Signed)
 Transition of Care [facility]) - Inpatient Brief Assessment   Patient Details  Name: Lacey Bailey MRN: [id number] Date of Birth: 1945-11-26  Transition of Care [facility]) CM/SW Contact:    Noreen KATHEE Pinal, LCSWA Phone Number: 06/08/2024, 9:30 AM   Clinical Narrative:  Inpatient Care Management (ICM) has reviewed patient and no other ICM needs have been identified at this time. We will continue to monitor patient advancement through interdisciplinary progression rounds. If new patient transition needs arise, please place a ICM consult.  Transition of Care Asessment: Insurance and Status: Insurance coverage has been reviewed Patient has primary care physician: Yes Home environment has been reviewed: Single Family Home Prior level of function:: Independent Prior/Current Home Services: No current home services Social Drivers of Health Review: SDOH reviewed no interventions necessary Readmission risk has been reviewed: Yes Transition of care needs: no transition of care needs at this time

## 2024-06-09 DIAGNOSIS — I214 Non-ST elevation (NSTEMI) myocardial infarction: Secondary | ICD-10-CM | POA: Diagnosis not present

## 2024-06-09 DIAGNOSIS — I350 Nonrheumatic aortic (valve) stenosis: Secondary | ICD-10-CM | POA: Diagnosis not present

## 2024-06-09 LAB — CBC
HCT: 34.7 % — ABNORMAL LOW (ref 36.0–46.0)
Hemoglobin: 11.5 g/dL — ABNORMAL LOW (ref 12.0–15.0)
MCH: 31.5 pg (ref 26.0–34.0)
MCHC: 33.1 g/dL (ref 30.0–36.0)
MCV: 95.1 fL (ref 80.0–100.0)
Platelets: 318 K/uL (ref 150–400)
RBC: 3.65 MIL/uL — ABNORMAL LOW (ref 3.87–5.11)
RDW: 14 % (ref 11.5–15.5)
WBC: 11.8 K/uL — ABNORMAL HIGH (ref 4.0–10.5)
nRBC: 0 % (ref 0.0–0.2)

## 2024-06-09 LAB — BASIC METABOLIC PANEL WITH GFR
Anion gap: 12 (ref 5–15)
BUN: 18 mg/dL (ref 8–23)
CO2: 21 mmol/L — ABNORMAL LOW (ref 22–32)
Calcium: 9 mg/dL (ref 8.9–10.3)
Chloride: 105 mmol/L (ref 98–111)
Creatinine, Ser: 2.3 mg/dL — ABNORMAL HIGH (ref 0.44–1.00)
GFR, Estimated: 21 mL/min — ABNORMAL LOW (ref >60)
Glucose, Bld: 99 mg/dL (ref 70–99)
Potassium: 3.7 mmol/L (ref 3.5–5.1)
Sodium: 139 mmol/L (ref 135–145)

## 2024-06-09 LAB — T4, FREE: Free T4: 0.7 ng/dL (ref 0.61–1.12)

## 2024-06-09 LAB — HEPARIN LEVEL (UNFRACTIONATED): Heparin Unfractionated: 0.46 [IU]/mL (ref 0.30–0.70)

## 2024-06-09 MED ORDER — LEVOTHYROXINE SODIUM 100 MCG PO TABS
100.0000 ug | ORAL_TABLET | Freq: Every day | ORAL | Status: DC
  Administered 2024-06-10 – 2024-06-11 (×2): 100 ug via ORAL
  Filled 2024-06-09 (×2): qty 1

## 2024-06-09 MED ORDER — BISOPROLOL FUMARATE 5 MG PO TABS
5.0000 mg | ORAL_TABLET | Freq: Every day | ORAL | Status: DC
  Administered 2024-06-09 – 2024-06-11 (×3): 5 mg via ORAL
  Filled 2024-06-09 (×3): qty 1

## 2024-06-09 MED ORDER — HYDRALAZINE HCL 20 MG/ML IJ SOLN
10.0000 mg | Freq: Once | INTRAMUSCULAR | Status: AC
  Administered 2024-06-09: 10 mg via INTRAVENOUS
  Filled 2024-06-09: qty 1

## 2024-06-09 NOTE — Progress Notes (Signed)
 Pt comfortable at this time, no further complains.  Will continue to monitor pt closely.

## 2024-06-09 NOTE — Care Management Important Message (Signed)
 Important Message  Patient Details  Name: Lacey Bailey MRN: [id number] Date of Birth: April 07, 1946   Important Message Given:  Yes - Medicare IM     [name] [name] 06/09/2024, 4:06 PM

## 2024-06-09 NOTE — Plan of Care (Signed)

## 2024-06-09 NOTE — Progress Notes (Signed)
 Cardiology : Lacey Bailey   Subjective:  Denies SSCP, palpitations or Dyspnea   Objective:  Vitals:   06/09/24 0400 06/09/24 0506 06/09/24 0539 06/09/24 0714  BP:  (!) 168/90 (!) 170/92 (!) 140/87  Pulse: 70 67  71  Resp: 19 18  19   Temp:  98.3 F (36.8 C)    TempSrc:  Oral    SpO2: 94% 96%  97%  Weight:      Height:        Intake/Output from previous day:  Intake/Output Summary (Last 24 hours) at 06/09/2024 0755 Last data filed at 06/09/2024 0657 Gross per 24 hour  Intake 1095.68 ml  Output 1275 ml  Net -179.32 ml    Physical Exam: Overweight female AS murmur 4/6 SEM Lungs clear Abdomen benign Plus one edema  Lab Results: Basic Metabolic Panel: Recent Labs    06/07/24 1700 06/08/24 0430  NA 136 137  K 3.8 4.4  CL 99 103  CO2 21* 23  GLUCOSE 110* 88  BUN 21 19  CREATININE 2.63* 2.34*  CALCIUM  10.3 9.7  MG 1.8  --    Liver Function Tests: Recent Labs    06/07/24 1700  AST 77*  ALT 41  ALKPHOS 93  BILITOT 1.4*  PROT 7.9  ALBUMIN 4.5   Recent Labs    06/07/24 1700  LIPASE 14   CBC: Recent Labs    06/07/24 1700 06/08/24 0430 06/09/24 0453  WBC 15.1* 13.1* 11.8*  NEUTROABS 11.2*  --   --   HGB 13.4 11.5* 11.5*  HCT 40.3 34.7* 34.7*  MCV 92.6 92.8 95.1  PLT 351 315 318    Thyroid  Function Tests: Recent Labs    06/07/24 1700  TSH 20.200*     Imaging: ECHOCARDIOGRAM COMPLETE Result Date: 06/08/2024    ECHOCARDIOGRAM REPORT   Patient Name:   Lacey Bailey Date of Exam: 06/08/2024 Medical Rec #:  [id number]      Height:       62.0 in Accession #:    [id number]     Weight:       218.0 lb Date of Birth:  [birth year]      BSA:          1.983 m Patient Age:    78 years       BP:           157/62 mmHg Patient Gender: F              HR:           77 bpm. Exam Location:  [facility] Procedure: 2D Echo, Cardiac Doppler and Color Doppler (Both Spectral and Color            Flow Doppler were utilized during procedure). Indications:    Elevated  Troponin  History:        Patient has prior history of Echocardiogram examinations, most                 recent 07/15/2023. CAD, Prior CABG; Risk Factors:Sleep Apnea,                 Non-Smoker, Dyslipidemia and Hypertension.  Sonographer:    Aida Pizza RCS Referring Phys: [phone] EJIROGHENE E EMOKPAE IMPRESSIONS  1. Left ventricular ejection fraction, by estimation, is 50 to 55%. Left ventricular ejection fraction by 3D volume is 50 %. The left ventricle has low normal function. The left ventricle demonstrates regional wall motion abnormalities (see scoring diagram/findings for description). There  is moderate left ventricular hypertrophy of the basal-septal segment. Left ventricular diastolic parameters are indeterminate. Elevated left atrial pressure. There is akinesis of the left ventricular, basal inferior wall.  2. Right ventricular systolic function is normal. The right ventricular size is normal. There is normal pulmonary artery systolic pressure. The estimated right ventricular systolic pressure is 22.9 mmHg.  3. The mitral valve is degenerative. Mild to moderate mitral valve regurgitation. No evidence of mitral stenosis.  4. The aortic valve is calcified. There is severe calcifcation of the aortic valve. There is severe thickening of the aortic valve. Aortic valve regurgitation is not visualized. Moderate aortic valve stenosis. Aortic valve area, by VTI measures 1.06 cm. Aortic valve mean gradient measures 13.0 mmHg. Aortic valve Vmax measures 2.41 m/s. Althought the mean AVG is c/w mild AS, the DI is low at 0.34 and SVI 29 both c/w moderate paradoxical low flow low gradient AS  5. The inferior vena cava is normal in size with greater than 50% respiratory variability, suggesting right atrial pressure of 3 mmHg. FINDINGS  Left Ventricle: Left ventricular ejection fraction, by estimation, is 50 to 55%. Left ventricular ejection fraction by 3D volume is 50 %. The left ventricle has low normal function. The  left ventricle demonstrates regional wall motion abnormalities. The  left ventricular internal cavity size was normal in size. There is moderate left ventricular hypertrophy of the basal-septal segment. Left ventricular diastolic parameters are indeterminate. Elevated left atrial pressure. Right Ventricle: The right ventricular size is normal. No increase in right ventricular wall thickness. Right ventricular systolic function is normal. There is normal pulmonary artery systolic pressure. The tricuspid regurgitant velocity is 2.23 m/s, and  with an assumed right atrial pressure of 3 mmHg, the estimated right ventricular systolic pressure is 22.9 mmHg. Left Atrium: Left atrial size was normal in size. Right Atrium: Right atrial size was normal in size. Pericardium: There is no evidence of pericardial effusion. Mitral Valve: The mitral valve is degenerative in appearance. There is mild calcification of the mitral valve leaflet(s). Mild mitral annular calcification. Mild to moderate mitral valve regurgitation. No evidence of mitral valve stenosis. Tricuspid Valve: The tricuspid valve is normal in structure. Tricuspid valve regurgitation is mild . No evidence of tricuspid stenosis. Aortic Valve: The aortic valve is calcified. There is severe calcifcation of the aortic valve. There is severe thickening of the aortic valve. Aortic valve regurgitation is not visualized. Moderate aortic stenosis is present. Aortic valve mean gradient measures 13.0 mmHg. Aortic valve peak gradient measures 23.2 mmHg. Aortic valve area, by VTI measures 1.06 cm. Pulmonic Valve: The pulmonic valve was normal in structure. Pulmonic valve regurgitation is trivial. No evidence of pulmonic stenosis. Aorta: The aortic root is normal in size and structure. Venous: The inferior vena cava is normal in size with greater than 50% respiratory variability, suggesting right atrial pressure of 3 mmHg. IAS/Shunts: No atrial level shunt detected by color flow  Doppler. Additional Comments: 3D was performed not requiring image post processing on an independent workstation and was normal.  LEFT VENTRICLE PLAX 2D LVIDd:         5.20 cm         Diastology LVIDs:         3.85 cm         LV e' medial:    5.22 cm/s LV PW:         1.10 cm         LV E/e' medial:  16.0 LV IVS:  1.40 cm         LV e' lateral:   5.97 cm/s LVOT diam:     2.00 cm         LV E/e' lateral: 14.0 LV SV:         58 LV SV Index:   29 LVOT Area:     3.14 cm        3D Volume EF                                LV 3D EF:    Left                                             ventricul                                             ar                                             ejection                                             fraction                                             by 3D                                             volume is                                             50 %.                                 3D Volume EF:                                3D EF:        50 %                                LV EDV:       117 ml                                LV ESV:       59 ml  LV SV:        58 ml RIGHT VENTRICLE RV S prime:     9.32 cm/s TAPSE (M-mode): 1.7 cm LEFT ATRIUM             Index        RIGHT ATRIUM           Index LA diam:        3.50 cm 1.76 cm/m   RA Area:     14.65 cm LA Vol (A2C):   56.0 ml 28.24 ml/m  RA Volume:   35.30 ml  17.80 ml/m LA Vol (A4C):   74.3 ml 37.47 ml/m LA Biplane Vol: 67.1 ml 33.84 ml/m  AORTIC VALVE AV Area (Vmax):    1.14 cm AV Area (Vmean):   1.12 cm AV Area (VTI):     1.06 cm AV Vmax:           241.00 cm/s AV Vmean:          167.500 cm/s AV VTI:            0.550 m AV Peak Grad:      23.2 mmHg AV Mean Grad:      13.0 mmHg LVOT Vmax:         87.60 cm/s LVOT Vmean:        59.600 cm/s LVOT VTI:          0.186 m LVOT/AV VTI ratio: 0.34  AORTA Ao Root diam: 3.70 cm MITRAL VALVE                  TRICUSPID VALVE MV Area (PHT): 4.06 cm        TR Peak grad:   19.9 mmHg MV Decel Time: 187 msec       TR Vmax:        223.00 cm/s MR Peak grad:    91.4 mmHg MR Mean grad:    55.0 mmHg    SHUNTS MR Vmax:         478.00 cm/s  Systemic VTI:  0.19 m MR Vmean:        349.0 cm/s   Systemic Diam: 2.00 cm MR PISA:         2.26 cm MR PISA Eff ROA: 14 mm MR PISA Radius:  0.60 cm MV E velocity: 83.30 cm/s MV A velocity: 74.30 cm/s MV E/A ratio:  1.12 Wilbert Bihari MD Electronically signed by Wilbert Bihari MD Signature Date/Time: 06/08/2024/3:14:53 PM    Final    CT CHEST ABDOMEN PELVIS WO CONTRAST Result Date: 06/07/2024 CLINICAL DATA:  Unintended weight loss.  Fatigue.  Left flank pain. EXAM: CT CHEST, ABDOMEN AND PELVIS WITHOUT CONTRAST TECHNIQUE: Multidetector CT imaging of the chest, abdomen and pelvis was performed following the standard protocol without IV contrast. RADIATION DOSE REDUCTION: This exam was performed according to the departmental dose-optimization program which includes automated exposure control, adjustment of the mA and/or kV according to patient size and/or use of iterative reconstruction technique. COMPARISON:  None Available. FINDINGS: CT CHEST FINDINGS Cardiovascular: Diffuse coronary artery and aortic atherosclerosis. Prior CABG. Heart is normal size. Aorta is normal caliber. Mediastinum/Nodes: No mediastinal, hilar, or axillary adenopathy. Trachea and esophagus are unremarkable. Thyroid  unremarkable. Lungs/Pleura: No confluent airspace opacities or effusions. Musculoskeletal: Chest wall soft tissues are unremarkable. No acute bony abnormality. CT ABDOMEN PELVIS FINDINGS Hepatobiliary: No focal liver abnormality is seen. Status post cholecystectomy. No biliary dilatation. Pancreas: No focal abnormality or ductal dilatation. Spleen: No focal abnormality.  Normal  size. Adrenals/Urinary Tract: No adrenal abnormality. No focal renal abnormality. No stones or hydronephrosis. Urinary bladder is unremarkable. Stomach/Bowel: Stomach, large and  small bowel grossly unremarkable. Vascular/Lymphatic: Diffuse aortoiliac atherosclerosis. No evidence of aneurysm or adenopathy. Reproductive: Prior hysterectomy.  No adnexal masses. Other: No free fluid or free air. Musculoskeletal: No acute bony abnormality. IMPRESSION: No acute findings in the chest, abdomen or pelvis. Diffuse aortoiliac atherosclerosis. Coronary artery disease, status post CABG. Electronically Signed   By: Franky Crease M.D.   On: 06/07/2024 18:43    Cardiac Studies:  ECG: SR LVH with strain rate 92   Telemetry:  06/09/2024 NSR  Echo:   Medications:    aspirin  EC  81 mg Oral QHS   bisoprolol   5 mg Oral Daily   Chlorhexidine  Gluconate Cloth  6 each Topical Q0600   isosorbide  mononitrate  30 mg Oral BID   levothyroxine   75 mcg Oral QAC breakfast   PARoxetine   40 mg Oral QHS   rosuvastatin   20 mg Oral q AM      sodium chloride  50 mL/hr at 06/09/24 0220   heparin  900 Units/hr (06/08/24 1622)   nitroGLYCERIN  Stopped (06/08/24 1037)    Assessment/Plan:  1. NSTEMI - no angina - Known occluded SVG with native collateralized vessels - No indication for cath patent LIMA - nitroglycerin  iv weaned on oral imdur   - Can d/c heparin  in am    2. CAD - She underwent CABG in 2003 with multiple interventions since at outside hospitals and most recent DES to PLB in 11/2014, angioplasty to mid-RCA and mid-LCx in 10/2015 with patent LIMA-LAD and occlusion of all vein grafts. Most recent cardiac catheterization in 12/2017 showed a patent LIMA-LAD with occlusion of all vein grafts and occlusion of previously placed LCx and RCA stents with collaterals present and she was not felt to be a candidate for redo CABG.  - Continue ASA, Bisoprolol  and Crestor .    3. Accelerated HTN - BP improved continue imdur  and beta blocker    4. Aortic Stenosis - She had moderate, paradoxical low-flow low-gradient AS by echo stable by TTE done yesterday mean gradient 13 , peak 23.2 mmHg AVA 1.1 cm2  with DVI 0.34   5. Hypothyroidism - TSH at 20.200 this admission. She has been continued on Synthroid . Defer dose adjustment to the admitting team.    6. AKI - Baseline creatinine 1.0 - 1.3. slight improvement from 2.63-> 2.34 labs pending this am.  Lacey Bailey 06/09/2024, 7:55 AM

## 2024-06-09 NOTE — Progress Notes (Signed)
 At this time pt denied epigastric burning and discomfort, no recent cough spells. Pt comfortable.

## 2024-06-09 NOTE — Progress Notes (Signed)
 Pt BP elevated, manual BP 170/92, other VS WDL. Pt asymptomatic. Pt has order for nitroglycerin  drip for SBP>150. Plan is to discharge pt today. Jesus, NP notified about restarting nitroglycerin  drip vs other treatment option.   Zebeta  scheduled for 10am increased to 5mg .

## 2024-06-09 NOTE — Progress Notes (Signed)
 PHARMACY - ANTICOAGULATION CONSULT NOTE  Pharmacy Consult for Heparin  Indication: chest pain/ACS  No Known Allergies  Patient Measurements: Height: 5' 2 (157.5 cm) Weight: 98.9 kg (218 lb 0.6 oz) IBW/kg (Calculated) : 50.1 HEPARIN  DW (KG): 73.5  Vital Signs: Temp: 98.3 F (36.8 C) (10/17 0506) Temp Source: Oral (10/17 0506) BP: 140/87 (10/17 0714) Pulse Rate: 71 (10/17 0714)  Labs: Recent Labs    06/07/24 1700 06/08/24 0430 06/08/24 1956 06/09/24 0453  HGB 13.4 11.5*  --  11.5*  HCT 40.3 34.7*  --  34.7*  PLT 351 315  --  318  HEPARINUNFRC  --   --  0.55 0.46  CREATININE 2.63* 2.34*  --   --     Estimated Creatinine Clearance: 21.1 mL/min (A) (by C-G formula based on SCr of 2.34 mg/dL (H)).   Medical History: Past Medical History:  Diagnosis Date   Aortic sclerosis    CAD (coronary artery disease)    a. s/p CABG in 2003 b. multiple interventions since at outside hospitals c. DES to PLB in 11/2014 d. angioplasty to mid-RCA and mid-LCx in 10/2015 with patent LIMA-LAD and occlusion of all vein grafts e. 12/2017: occlusion of vein grafts and native LCx and RCA with patent LIMA-LAD. Re-do CABG NOT recommended by CT surgery   Depression    Essential hypertension    History of kidney stones    Hyperlipidemia    Hypothyroidism    Sleep apnea    TIA (transient ischemic attack) ~ 2013   Assessment: 78 year female admitted to [facility] with shortness of breath. Patient with known CAD and now elevated (flat) troponin's in 600-700s. Patient not on anticoagulants prior to admission. New orders to start IV heparin  for ACS treatment.   HL 0.46- therapeutic CBC WNL  Goal of Therapy:  Heparin  level 0.3-0.7 units/ml Monitor platelets by anticoagulation protocol: Yes   Plan:  Continue heparin  infusion at 900 units/hr Check heparin  level daily Continue to monitor H&H and platelets.  Elspeth Sour, PharmD Clinical Pharmacist 06/09/2024 8:10 AM

## 2024-06-09 NOTE — Progress Notes (Addendum)
 PROGRESS NOTE    [name]  FMW:[id number] DOB: Nov 16, 1945 DOA: 06/07/2024 PCP: Cook, Jayce G, DO   Brief Narrative:   Lacey Bailey is a 78 y.o. female with medical history significant for CABG, hypertension, OSA, hypothyroidism, depression who presented to the outpatient provider with multiple complaints including chest pain, was sent to the ED.  Patient was having abdominal discomfort as well as poor oral intake as well.  Patient has complicated coronary artery history with history of bypass and multiple stents with subsequent restenosis.  She is not a candidate for redo CABG. ED Course: Blood pressure elevated more than 170s in the ER.  Troponin 641>> 598>>648. Creatinine elevated at 2.63. COVID influenza RSV negative. TSH 20.2 proBNP 4551. Patient admitted for unstable angina/non-STEMI    Assessment & Plan:   Principal Problem:   AKI (acute kidney injury) Active Problems:   NSTEMI (non-ST elevated myocardial infarction) (HCC)   Hypothyroidism   Essential hypertension   S/P CABG (coronary artery bypass graft)   Assessment and Plan:   AKI-creatinine 2.63 on admission, baseline 1-1.3.  In the setting of poor oral intake over the past week. Has been receiving IV fluid, labs from today is pending. Will continue to hold lisinopril .  Avoid nephrotoxics.   NSTEMI with history of CABG 2003 with multiple PCI's-chest pain resolved with nitroglycerin .  Chronic chest pains, typically takes nitroglycerin  2-3 times a week.  Follows with Dr. Debera.  Most recent angiography 2019 showed continued patent LIMA to LAD with occlusion of all vein grafts as well as native circumflex and RCA.  Redo CABG was not recommended at that time and revascularization options are limited.  Last echo 06/2023 EF of 60 to 65%, G1DD, moderate aortic stenosis. -Troponin 641>> 598.  With elevated BNP at 4551  -Cardiology on board.  Continue medical therapy - Off nicardipine drip, started on Imdur  30  twice daily.  Continue aspirin , beta-blocker - Started on IV heparin  10/16.  Cardiology recommending continuing it until tomorrow. - Patient chest pain for more than 24 hours   Elevated blood pressure-systolic up to 170s on admission. - Continue Imdur  30 mg every 12 hours -Continue bisoprolol  - Holding low-dose lisinopril  with AKI   Left flank pain-resolved, CT chest abdomen pelvis without contrast negative for acute abnormality. UA concerning for UTI.  Culture in progress Monitor off antibiotics.  Patient did report of some pain on admission however this is resolved.   Moderate aortic stenosis   Hypothyroidism-TSH elevated at 20.2. - Check free T4 - Currently on Synthyroid 75 mg daily, will increase to 100 mcg daily and follow-up outpatient with TSH in 6-8 weeks     DVT prophylaxis: Heparin  B and E Code Status: Full code,  Family Communication: Disposition Plan: ~DC home tomorrow   consults called: Cardiology Admission status: Inpatient, stepdown I certify that at the point of admission it is my clinical judgment that the patient will require inpatient hospital care spanning beyond 2 midnights from the point of admission due to high intensity of service, high risk for further deterioration and high frequency of surveillance required.      Subjective:  Patient seen and examined at the bedside.  She denies any chest pain today, in fact has been chest free more than 24 hours.  Denies nausea vomiting, shortness of breath.  Tolerating IV heparin  Hypertensive in the a.m. with blood pressure 191/85.  Objective: Vitals:   06/09/24 0800 06/09/24 0824 06/09/24 0925 06/09/24 1000  BP: (!) 191/85  (!) 151/76 ROLLEN)  155/89  Pulse: 80  64 62  Resp: 20  19 18   Temp:  98.2 F (36.8 C)    TempSrc:  Oral    SpO2: 98%  94% 90%  Weight:      Height:        Intake/Output Summary (Last 24 hours) at 06/09/2024 1222 Last data filed at 06/09/2024 0909 Gross per 24 hour  Intake 574.41 ml  Output  1150 ml  Net -575.59 ml   Filed Weights   06/07/24 1550 06/07/24 2100  Weight: 100 kg 98.9 kg    Examination:  General: Alert, oriented not in any acute distress Chest: Clear CVS: S1, S2, ejection systolic murmur, regular rhythm Abdomen: Soft, nontender Extremities: No edema   Data Reviewed: I have personally reviewed following labs and imaging studies  CBC: Recent Labs  Lab 06/07/24 1700 06/08/24 0430 06/09/24 0453  WBC 15.1* 13.1* 11.8*  NEUTROABS 11.2*  --   --   HGB 13.4 11.5* 11.5*  HCT 40.3 34.7* 34.7*  MCV 92.6 92.8 95.1  PLT 351 315 318   Basic Metabolic Panel: Recent Labs  Lab 06/07/24 1700 06/08/24 0430  NA 136 137  K 3.8 4.4  CL 99 103  CO2 21* 23  GLUCOSE 110* 88  BUN 21 19  CREATININE 2.63* 2.34*  CALCIUM  10.3 9.7  MG 1.8  --    GFR: Estimated Creatinine Clearance: 21.1 mL/min (A) (by C-G formula based on SCr of 2.34 mg/dL (H)). Liver Function Tests: Recent Labs  Lab 06/07/24 1700  AST 77*  ALT 41  ALKPHOS 93  BILITOT 1.4*  PROT 7.9  ALBUMIN 4.5   Recent Labs  Lab 06/07/24 1700  LIPASE 14   No results for input(s): AMMONIA in the last 168 hours. Coagulation Profile: No results for input(s): INR, PROTIME in the last 168 hours. Cardiac Enzymes: No results for input(s): CKTOTAL, CKMB, CKMBINDEX, TROPONINI in the last 168 hours. BNP (last 3 results) Recent Labs    06/07/24 1700  PROBNP 4,551.0*   HbA1C: No results for input(s): HGBA1C in the last 72 hours. CBG: No results for input(s): GLUCAP in the last 168 hours. Lipid Profile: No results for input(s): CHOL, HDL, LDLCALC, TRIG, CHOLHDL, LDLDIRECT in the last 72 hours. Thyroid  Function Tests: Recent Labs    06/07/24 1700  TSH 20.200*   Anemia Panel: No results for input(s): VITAMINB12, FOLATE, FERRITIN, TIBC, IRON, RETICCTPCT in the last 72 hours. Sepsis Labs: No results for input(s): PROCALCITON, LATICACIDVEN in the last  168 hours.  Recent Results (from the past 240 hours)  Resp panel by RT-PCR (RSV, Flu A&B, Covid) Anterior Nasal Swab     Status: None   Collection Time: 06/07/24  7:28 PM   Specimen: Anterior Nasal Swab  Result Value Ref Range Status   SARS Coronavirus 2 by RT PCR NEGATIVE NEGATIVE Final    Comment: (NOTE) SARS-CoV-2 target nucleic acids are NOT DETECTED.  The SARS-CoV-2 RNA is generally detectable in upper respiratory specimens during the acute phase of infection. The lowest concentration of SARS-CoV-2 viral copies this assay can detect is 138 copies/mL. A negative result does not preclude SARS-Cov-2 infection and should not be used as the sole basis for treatment or other patient management decisions. A negative result may occur with  improper specimen collection/handling, submission of specimen other than nasopharyngeal swab, presence of viral mutation(s) within the areas targeted by this assay, and inadequate number of viral copies(<138 copies/mL). A negative result must be combined with clinical  observations, patient history, and epidemiological information. The expected result is Negative.  Fact Sheet for Patients:  BloggerCourse.com  Fact Sheet for Healthcare Providers:  SeriousBroker.it  This test is no t yet approved or cleared by the [place]  FDA and  has been authorized for detection and/or diagnosis of SARS-CoV-2 by FDA under an Emergency Use Authorization (EUA). This EUA will remain  in effect (meaning this test can be used) for the duration of the COVID-19 declaration under Section 564(b)(1) of the Act, 21 U.S.C.section 360bbb-3(b)(1), unless the authorization is terminated  or revoked sooner.       Influenza A by PCR NEGATIVE NEGATIVE Final   Influenza B by PCR NEGATIVE NEGATIVE Final    Comment: (NOTE) The Xpert Xpress SARS-CoV-2/FLU/RSV plus assay is intended as an aid in the diagnosis of influenza from  Nasopharyngeal swab specimens and should not be used as a sole basis for treatment. Nasal washings and aspirates are unacceptable for Xpert Xpress SARS-CoV-2/FLU/RSV testing.  Fact Sheet for Patients: BloggerCourse.com  Fact Sheet for Healthcare Providers: SeriousBroker.it  This test is not yet approved or cleared by the [place]  FDA and has been authorized for detection and/or diagnosis of SARS-CoV-2 by FDA under an Emergency Use Authorization (EUA). This EUA will remain in effect (meaning this test can be used) for the duration of the COVID-19 declaration under Section 564(b)(1) of the Act, 21 U.S.C. section 360bbb-3(b)(1), unless the authorization is terminated or revoked.     Resp Syncytial Virus by PCR NEGATIVE NEGATIVE Final    Comment: (NOTE) Fact Sheet for Patients: BloggerCourse.com  Fact Sheet for Healthcare Providers: SeriousBroker.it  This test is not yet approved or cleared by the [place]  FDA and has been authorized for detection and/or diagnosis of SARS-CoV-2 by FDA under an Emergency Use Authorization (EUA). This EUA will remain in effect (meaning this test can be used) for the duration of the COVID-19 declaration under Section 564(b)(1) of the Act, 21 U.S.C. section 360bbb-3(b)(1), unless the authorization is terminated or revoked.  Performed at [facility], [address], [place], [place] [address]   MRSA Next Gen by PCR, Nasal     Status: None   Collection Time: 06/07/24  9:03 PM   Specimen: Nasal Mucosa; Nasal Swab  Result Value Ref Range Status   MRSA by PCR Next Gen NOT DETECTED NOT DETECTED Final    Comment: (NOTE) The GeneXpert MRSA Assay (FDA approved for NASAL specimens only), is one component of a comprehensive MRSA colonization surveillance program. It is not intended to diagnose MRSA infection nor to guide or monitor treatment for  MRSA infections. Test performance is not FDA approved in patients less than 33 years old. Performed at [facility], [address], [place], [place] [address]          Radiology Studies: ECHOCARDIOGRAM COMPLETE Result Date: 06/08/2024    ECHOCARDIOGRAM REPORT   Patient Name:   Lacey Bailey Date of Exam: 06/08/2024 Medical Rec #:  [id number]      Height:       62.0 in Accession #:    [id number]     Weight:       218.0 lb Date of Birth:  1946-02-28      BSA:          1.983 m Patient Age:    78 years       BP:           157/62 mmHg Patient Gender: F  HR:           77 bpm. Exam Location:  [facility] Procedure: 2D Echo, Cardiac Doppler and Color Doppler (Both Spectral and Color            Flow Doppler were utilized during procedure). Indications:    Elevated Troponin  History:        Patient has prior history of Echocardiogram examinations, most                 recent 07/15/2023. CAD, Prior CABG; Risk Factors:Sleep Apnea,                 Non-Smoker, Dyslipidemia and Hypertension.  Sonographer:    Aida Pizza RCS Referring Phys: [phone] EJIROGHENE E EMOKPAE IMPRESSIONS  1. Left ventricular ejection fraction, by estimation, is 50 to 55%. Left ventricular ejection fraction by 3D volume is 50 %. The left ventricle has low normal function. The left ventricle demonstrates regional wall motion abnormalities (see scoring diagram/findings for description). There is moderate left ventricular hypertrophy of the basal-septal segment. Left ventricular diastolic parameters are indeterminate. Elevated left atrial pressure. There is akinesis of the left ventricular, basal inferior wall.  2. Right ventricular systolic function is normal. The right ventricular size is normal. There is normal pulmonary artery systolic pressure. The estimated right ventricular systolic pressure is 22.9 mmHg.  3. The mitral valve is degenerative. Mild to moderate mitral valve regurgitation. No evidence of mitral stenosis.  4. The  aortic valve is calcified. There is severe calcifcation of the aortic valve. There is severe thickening of the aortic valve. Aortic valve regurgitation is not visualized. Moderate aortic valve stenosis. Aortic valve area, by VTI measures 1.06 cm. Aortic valve mean gradient measures 13.0 mmHg. Aortic valve Vmax measures 2.41 m/s. Althought the mean AVG is c/w mild AS, the DI is low at 0.34 and SVI 29 both c/w moderate paradoxical low flow low gradient AS  5. The inferior vena cava is normal in size with greater than 50% respiratory variability, suggesting right atrial pressure of 3 mmHg. FINDINGS  Left Ventricle: Left ventricular ejection fraction, by estimation, is 50 to 55%. Left ventricular ejection fraction by 3D volume is 50 %. The left ventricle has low normal function. The left ventricle demonstrates regional wall motion abnormalities. The  left ventricular internal cavity size was normal in size. There is moderate left ventricular hypertrophy of the basal-septal segment. Left ventricular diastolic parameters are indeterminate. Elevated left atrial pressure. Right Ventricle: The right ventricular size is normal. No increase in right ventricular wall thickness. Right ventricular systolic function is normal. There is normal pulmonary artery systolic pressure. The tricuspid regurgitant velocity is 2.23 m/s, and  with an assumed right atrial pressure of 3 mmHg, the estimated right ventricular systolic pressure is 22.9 mmHg. Left Atrium: Left atrial size was normal in size. Right Atrium: Right atrial size was normal in size. Pericardium: There is no evidence of pericardial effusion. Mitral Valve: The mitral valve is degenerative in appearance. There is mild calcification of the mitral valve leaflet(s). Mild mitral annular calcification. Mild to moderate mitral valve regurgitation. No evidence of mitral valve stenosis. Tricuspid Valve: The tricuspid valve is normal in structure. Tricuspid valve regurgitation is  mild . No evidence of tricuspid stenosis. Aortic Valve: The aortic valve is calcified. There is severe calcifcation of the aortic valve. There is severe thickening of the aortic valve. Aortic valve regurgitation is not visualized. Moderate aortic stenosis is present. Aortic valve mean gradient measures 13.0 mmHg.  Aortic valve peak gradient measures 23.2 mmHg. Aortic valve area, by VTI measures 1.06 cm. Pulmonic Valve: The pulmonic valve was normal in structure. Pulmonic valve regurgitation is trivial. No evidence of pulmonic stenosis. Aorta: The aortic root is normal in size and structure. Venous: The inferior vena cava is normal in size with greater than 50% respiratory variability, suggesting right atrial pressure of 3 mmHg. IAS/Shunts: No atrial level shunt detected by color flow Doppler. Additional Comments: 3D was performed not requiring image post processing on an independent workstation and was normal.  LEFT VENTRICLE PLAX 2D LVIDd:         5.20 cm         Diastology LVIDs:         3.85 cm         LV e' medial:    5.22 cm/s LV PW:         1.10 cm         LV E/e' medial:  16.0 LV IVS:        1.40 cm         LV e' lateral:   5.97 cm/s LVOT diam:     2.00 cm         LV E/e' lateral: 14.0 LV SV:         58 LV SV Index:   29 LVOT Area:     3.14 cm        3D Volume EF                                LV 3D EF:    Left                                             ventricul                                             ar                                             ejection                                             fraction                                             by 3D                                             volume is  50 %.                                 3D Volume EF:                                3D EF:        50 %                                LV EDV:       117 ml                                LV ESV:       59 ml                                LV SV:        58  ml RIGHT VENTRICLE RV S prime:     9.32 cm/s TAPSE (M-mode): 1.7 cm LEFT ATRIUM             Index        RIGHT ATRIUM           Index LA diam:        3.50 cm 1.76 cm/m   RA Area:     14.65 cm LA Vol (A2C):   56.0 ml 28.24 ml/m  RA Volume:   35.30 ml  17.80 ml/m LA Vol (A4C):   74.3 ml 37.47 ml/m LA Biplane Vol: 67.1 ml 33.84 ml/m  AORTIC VALVE AV Area (Vmax):    1.14 cm AV Area (Vmean):   1.12 cm AV Area (VTI):     1.06 cm AV Vmax:           241.00 cm/s AV Vmean:          167.500 cm/s AV VTI:            0.550 m AV Peak Grad:      23.2 mmHg AV Mean Grad:      13.0 mmHg LVOT Vmax:         87.60 cm/s LVOT Vmean:        59.600 cm/s LVOT VTI:          0.186 m LVOT/AV VTI ratio: 0.34  AORTA Ao Root diam: 3.70 cm MITRAL VALVE                  TRICUSPID VALVE MV Area (PHT): 4.06 cm       TR Peak grad:   19.9 mmHg MV Decel Time: 187 msec       TR Vmax:        223.00 cm/s MR Peak grad:    91.4 mmHg MR Mean grad:    55.0 mmHg    SHUNTS MR Vmax:         478.00 cm/s  Systemic VTI:  0.19 m MR Vmean:        349.0 cm/s   Systemic Diam: 2.00 cm MR PISA:         2.26 cm MR PISA Eff ROA: 14 mm MR PISA Radius:  0.60 cm MV E velocity: 83.30 cm/s MV A velocity: 74.30 cm/s MV E/A  ratio:  1.12 Wilbert Bihari MD Electronically signed by Wilbert Bihari MD Signature Date/Time: 06/08/2024/3:14:53 PM    Final    CT CHEST ABDOMEN PELVIS WO CONTRAST Result Date: 06/07/2024 CLINICAL DATA:  Unintended weight loss.  Fatigue.  Left flank pain. EXAM: CT CHEST, ABDOMEN AND PELVIS WITHOUT CONTRAST TECHNIQUE: Multidetector CT imaging of the chest, abdomen and pelvis was performed following the standard protocol without IV contrast. RADIATION DOSE REDUCTION: This exam was performed according to the departmental dose-optimization program which includes automated exposure control, adjustment of the mA and/or kV according to patient size and/or use of iterative reconstruction technique. COMPARISON:  None Available. FINDINGS: CT CHEST FINDINGS  Cardiovascular: Diffuse coronary artery and aortic atherosclerosis. Prior CABG. Heart is normal size. Aorta is normal caliber. Mediastinum/Nodes: No mediastinal, hilar, or axillary adenopathy. Trachea and esophagus are unremarkable. Thyroid  unremarkable. Lungs/Pleura: No confluent airspace opacities or effusions. Musculoskeletal: Chest wall soft tissues are unremarkable. No acute bony abnormality. CT ABDOMEN PELVIS FINDINGS Hepatobiliary: No focal liver abnormality is seen. Status post cholecystectomy. No biliary dilatation. Pancreas: No focal abnormality or ductal dilatation. Spleen: No focal abnormality.  Normal size. Adrenals/Urinary Tract: No adrenal abnormality. No focal renal abnormality. No stones or hydronephrosis. Urinary bladder is unremarkable. Stomach/Bowel: Stomach, large and small bowel grossly unremarkable. Vascular/Lymphatic: Diffuse aortoiliac atherosclerosis. No evidence of aneurysm or adenopathy. Reproductive: Prior hysterectomy.  No adnexal masses. Other: No free fluid or free air. Musculoskeletal: No acute bony abnormality. IMPRESSION: No acute findings in the chest, abdomen or pelvis. Diffuse aortoiliac atherosclerosis. Coronary artery disease, status post CABG. Electronically Signed   By: Franky Crease M.D.   On: 06/07/2024 18:43        Scheduled Meds:  aspirin  EC  81 mg Oral QHS   bisoprolol   5 mg Oral Daily   Chlorhexidine  Gluconate Cloth  6 each Topical Q0600   isosorbide  mononitrate  30 mg Oral BID   levothyroxine   75 mcg Oral QAC breakfast   PARoxetine   40 mg Oral QHS   rosuvastatin   20 mg Oral q AM   Continuous Infusions:  sodium chloride  50 mL/hr at 06/09/24 0220   heparin  900 Units/hr (06/08/24 1622)   nitroGLYCERIN  Stopped (06/08/24 1037)          Derryl Duval, MD [facility] 06/09/2024, 12:22 PM

## 2024-06-10 DIAGNOSIS — N179 Acute kidney failure, unspecified: Secondary | ICD-10-CM | POA: Diagnosis not present

## 2024-06-10 LAB — URINE CULTURE: Culture: >=100000 — AB

## 2024-06-10 LAB — CBC
HCT: 34.5 % — ABNORMAL LOW (ref 36.0–46.0)
Hemoglobin: 11.2 g/dL — ABNORMAL LOW (ref 12.0–15.0)
MCH: 30.9 pg (ref 26.0–34.0)
MCHC: 32.5 g/dL (ref 30.0–36.0)
MCV: 95 fL (ref 80.0–100.0)
Platelets: 317 K/uL (ref 150–400)
RBC: 3.63 MIL/uL — ABNORMAL LOW (ref 3.87–5.11)
RDW: 14.1 % (ref 11.5–15.5)
WBC: 11.3 K/uL — ABNORMAL HIGH (ref 4.0–10.5)
nRBC: 0 % (ref 0.0–0.2)

## 2024-06-10 LAB — HEPARIN LEVEL (UNFRACTIONATED): Heparin Unfractionated: 0.29 [IU]/mL — ABNORMAL LOW (ref 0.30–0.70)

## 2024-06-10 MED ORDER — CEPHALEXIN 250 MG PO CAPS
500.0000 mg | ORAL_CAPSULE | Freq: Three times a day (TID) | ORAL | Status: DC
  Administered 2024-06-10 – 2024-06-11 (×3): 500 mg via ORAL
  Filled 2024-06-10 (×3): qty 2

## 2024-06-10 MED ORDER — HEPARIN (PORCINE) 25000 UT/250ML-% IV SOLN: 1000.0000 [IU]/h | INTRAVENOUS | Status: AC

## 2024-06-10 MED ORDER — HEPARIN (PORCINE) 25000 UT/250ML-% IV SOLN: 1000.0000 [IU]/h | INTRAVENOUS | Status: DC

## 2024-06-10 NOTE — Progress Notes (Signed)
 PHARMACY - ANTICOAGULATION CONSULT NOTE  Pharmacy Consult for Heparin  Indication: chest pain/ACS  No Known Allergies  Patient Measurements: Height: 5' 2 (157.5 cm) Weight: 98.9 kg (218 lb 0.6 oz) IBW/kg (Calculated) : 50.1 HEPARIN  DW (KG): 73.5  Vital Signs: Temp: 98.6 F (37 C) (10/18 1135) Temp Source: Oral (10/18 1135) BP: 144/47 (10/18 1000) Pulse Rate: 56 (10/18 1000)  Labs: Recent Labs    06/07/24 1700 06/08/24 0430 06/08/24 1956 06/09/24 0453 06/09/24 1315 06/10/24 0505  HGB 13.4 11.5*  --  11.5*  --  11.2*  HCT 40.3 34.7*  --  34.7*  --  34.5*  PLT 351 315  --  318  --  317  HEPARINUNFRC  --   --  0.55 0.46  --  0.29*  CREATININE 2.63* 2.34*  --   --  2.30*  --     Estimated Creatinine Clearance: 21.4 mL/min (A) (by C-G formula based on SCr of 2.3 mg/dL (H)).   Medical History: Past Medical History:  Diagnosis Date   Aortic sclerosis    CAD (coronary artery disease)    a. s/p CABG in 2003 b. multiple interventions since at outside hospitals c. DES to PLB in 11/2014 d. angioplasty to mid-RCA and mid-LCx in 10/2015 with patent LIMA-LAD and occlusion of all vein grafts e. 12/2017: occlusion of vein grafts and native LCx and RCA with patent LIMA-LAD. Re-do CABG NOT recommended by CT surgery   Depression    Essential hypertension    History of kidney stones    Hyperlipidemia    Hypothyroidism    Sleep apnea    TIA (transient ischemic attack) ~ 2013   Assessment: 78 year female admitted to [facility] with shortness of breath. Patient with known CAD and now elevated (flat) troponin's in 600-700s. Patient not on anticoagulants prior to admission. New orders to start IV heparin  for ACS treatment.   HL 0.29- slightly subtherapeutic CBC WNL  Goal of Therapy:  Heparin  level 0.3-0.7 units/ml Monitor platelets by anticoagulation protocol: Yes   Plan:  Heparin  infusion at 1000 units/hr Stopping tonight ~1900 Continue to monitor H&H and platelets.  Elspeth Sour,  PharmD Clinical Pharmacist 06/10/2024 1:18 PM

## 2024-06-10 NOTE — Plan of Care (Signed)

## 2024-06-10 NOTE — Progress Notes (Signed)
 PROGRESS NOTE   Lacey Bailey  FMW:[id number] DOB: 11-17-1945 DOA: 06/07/2024 PCP: Cook, Jayce G, DO   Brief Narrative:   Lacey Bailey is a 78 y.o. female with medical history significant for CABG, hypertension, OSA, hypothyroidism, depression who presented to the outpatient provider with multiple complaints including chest pain, was sent to the ED.  Patient was having abdominal discomfort as well as poor oral intake as well.  Patient has complicated coronary artery history with history of bypass and multiple stents with subsequent restenosis.  She is not a candidate for redo CABG. ED Course: Blood pressure elevated more than 170s in the ER.  Troponin 641>> 598>>648. Creatinine elevated at 2.63. COVID influenza RSV negative. TSH 20.2 proBNP 4551. Patient admitted for unstable angina/non-STEMI   Assessment & Plan:   Principal Problem:   AKI (acute kidney injury) Active Problems:   NSTEMI (non-ST elevated myocardial infarction) (HCC)   Hypothyroidism   Essential hypertension   S/P CABG (coronary artery bypass graft)   Assessment and Plan:  1)NSTEMI with history of CABG in 2003 with multiple PCI's-chest pain resolved with nitroglycerin .  Chronic chest pains, typically takes nitroglycerin  2-3 times a week.  Follows with Dr. Debera.  Most recent angiography 2019 showed continued patent LIMA to LAD with occlusion of all vein grafts as well as native circumflex and RCA.  Redo CABG was not recommended at that time and revascularization options are limited.  Last echo 06/2023 EF of 60 to 65%, G1DD, moderate aortic stenosis. -Troponin 641>> 598>.678.  With elevated proBNP at 4551  -Please call cardiologist recommends medical management - Transition from off nitro drip to p.o. isosorbide   -continue aspirin  and bisoprolol  - Started on IV heparin  10/16.  -Okay to stop IV heparin  after current heparin  bag runs out later this evening - Will repeat troponin and EKG in a.m. and ambulate  patient in a.m. if asymptomatic from chest pain or dyspnea standpoint may discharge home   2)AKI----acute kidney injury in the setting of poor oral intake and dehydration prior to admission -   creatinine on admission= 2.63 , - baseline creatinine = 1.0 (08/22/24)   ,  creatinine is now= 2.30  , -Continue to hold lisinopril  --- renally adjust medications, avoid nephrotoxic agents / dehydration  / hypotension  3)HTN--stable, continue bisoprolol  and isosorbide  - Lisinopril  on hold due to AKI as above  4) pansensitive E. coli UTI with left flank pain-resolved left flank pain, CT chest abdomen pelvis without contrast negative for acute abnormality. -Treat empirically with Keflex per sensitivity report -No fevers or leukocytosis   5)Moderate aortic stenosis--- outpatient follow-up with cardiology for ongoing management   Hypothyroidism-TSH elevated at 20.2. -Free T4 WNL - Levothyroxine  increased to 100 mcg daily from 75 mcg prior to admission, - follow-up outpatient for repeat TSH in 4 to 6-weeks     DVT prophylaxis: Heparin  [place] Code Status: Full code,  Family Communication: Disposition Plan: ~DC home tomorrow   consults called: Cardiology Admission status: Inpatient, stepdown I certify that at the point of admission it is my clinical judgment that the patient will require inpatient hospital care spanning beyond 2 midnights from the point of admission due to high intensity of service, high risk for further deterioration and high frequency of surveillance required.      Subjective:  -Husband at bedside, - Appetite is poor, but no emesis -No fever  Or chills  -No chest pains or dyspnea at rest  Objective: Vitals:   06/10/24 0800 06/10/24 0900 06/10/24  1000 06/10/24 1135  BP: (!) 156/66 (!) 133/55 (!) 144/47   Pulse: 66 67 (!) 56   Resp: 20 16 15    Temp:    98.6 F (37 C)  TempSrc:    Oral  SpO2: 95% 97% 95%   Weight:      Height:        Intake/Output Summary (Last 24  hours) at 06/10/2024 1601 Last data filed at 06/10/2024 1221 Gross per 24 hour  Intake 355.41 ml  Output 750 ml  Net -394.59 ml   Filed Weights   06/07/24 1550 06/07/24 2100  Weight: 100 kg 98.9 kg    Physical Exam Gen:- Awake Alert, in no acute distress  HEENT:- [place].AT, No sclera icterus Neck-Supple Neck,No JVD,.  Lungs-  CTAB , fair air movement bilaterally  CV- S1, S2 normal, RRR, prior healed sternotomy scar Abd-  +ve B.Sounds, Abd Soft, No tenderness, no CVA area tenderness Extremity/Skin:- No  edema,   good pedal pulses  Psych-affect is appropriate, oriented x3 Neuro-no new focal deficits, no tremors  Data Reviewed: I have personally reviewed following labs and imaging studies  CBC: Recent Labs  Lab 06/07/24 1700 06/08/24 0430 06/09/24 0453 06/10/24 0505  WBC 15.1* 13.1* 11.8* 11.3*  NEUTROABS 11.2*  --   --   --   HGB 13.4 11.5* 11.5* 11.2*  HCT 40.3 34.7* 34.7* 34.5*  MCV 92.6 92.8 95.1 95.0  PLT 351 315 318 317   Basic Metabolic Panel: Recent Labs  Lab 06/07/24 1700 06/08/24 0430 06/09/24 1315  NA 136 137 139  K 3.8 4.4 3.7  CL 99 103 105  CO2 21* 23 21*  GLUCOSE 110* 88 99  BUN 21 19 18   CREATININE 2.63* 2.34* 2.30*  CALCIUM  10.3 9.7 9.0  MG 1.8  --   --    GFR: Estimated Creatinine Clearance: 21.4 mL/min (A) (by C-G formula based on SCr of 2.3 mg/dL (H)). Liver Function Tests: Recent Labs  Lab 06/07/24 1700  AST 77*  ALT 41  ALKPHOS 93  BILITOT 1.4*  PROT 7.9  ALBUMIN 4.5   Recent Labs  Lab 06/07/24 1700  LIPASE 14   BNP (last 3 results) Recent Labs    06/07/24 1700  PROBNP 4,551.0*   Thyroid  Function Tests: Recent Labs    06/07/24 1700 06/09/24 1315  TSH 20.200*  --   FREET4  --  0.70   Recent Results (from the past 240 hours)  Resp panel by RT-PCR (RSV, Flu A&B, Covid) Anterior Nasal Swab     Status: None   Collection Time: 06/07/24  7:28 PM   Specimen: Anterior Nasal Swab  Result Value Ref Range Status   SARS  Coronavirus 2 by RT PCR NEGATIVE NEGATIVE Final    Comment: (NOTE) SARS-CoV-2 target nucleic acids are NOT DETECTED.  The SARS-CoV-2 RNA is generally detectable in upper respiratory specimens during the acute phase of infection. The lowest concentration of SARS-CoV-2 viral copies this assay can detect is 138 copies/mL. A negative result does not preclude SARS-Cov-2 infection and should not be used as the sole basis for treatment or other patient management decisions. A negative result may occur with  improper specimen collection/handling, submission of specimen other than nasopharyngeal swab, presence of viral mutation(s) within the areas targeted by this assay, and inadequate number of viral copies(<138 copies/mL). A negative result must be combined with clinical observations, patient history, and epidemiological information. The expected result is Negative.  Fact Sheet for Patients:  BloggerCourse.com  Fact Sheet for Healthcare Providers:  SeriousBroker.it  This test is no t yet approved or cleared by the [place]  FDA and  has been authorized for detection and/or diagnosis of SARS-CoV-2 by FDA under an Emergency Use Authorization (EUA). This EUA will remain  in effect (meaning this test can be used) for the duration of the COVID-19 declaration under Section 564(b)(1) of the Act, 21 U.S.C.section 360bbb-3(b)(1), unless the authorization is terminated  or revoked sooner.       Influenza A by PCR NEGATIVE NEGATIVE Final   Influenza B by PCR NEGATIVE NEGATIVE Final    Comment: (NOTE) The Xpert Xpress SARS-CoV-2/FLU/RSV plus assay is intended as an aid in the diagnosis of influenza from Nasopharyngeal swab specimens and should not be used as a sole basis for treatment. Nasal washings and aspirates are unacceptable for Xpert Xpress SARS-CoV-2/FLU/RSV testing.  Fact Sheet for  Patients: BloggerCourse.com  Fact Sheet for Healthcare Providers: SeriousBroker.it  This test is not yet approved or cleared by the [place]  FDA and has been authorized for detection and/or diagnosis of SARS-CoV-2 by FDA under an Emergency Use Authorization (EUA). This EUA will remain in effect (meaning this test can be used) for the duration of the COVID-19 declaration under Section 564(b)(1) of the Act, 21 U.S.C. section 360bbb-3(b)(1), unless the authorization is terminated or revoked.     Resp Syncytial Virus by PCR NEGATIVE NEGATIVE Final    Comment: (NOTE) Fact Sheet for Patients: BloggerCourse.com  Fact Sheet for Healthcare Providers: SeriousBroker.it  This test is not yet approved or cleared by the [place]  FDA and has been authorized for detection and/or diagnosis of SARS-CoV-2 by FDA under an Emergency Use Authorization (EUA). This EUA will remain in effect (meaning this test can be used) for the duration of the COVID-19 declaration under Section 564(b)(1) of the Act, 21 U.S.C. section 360bbb-3(b)(1), unless the authorization is terminated or revoked.  Performed at [facility], [address], [place], [place] [address]   MRSA Next Gen by PCR, Nasal     Status: None   Collection Time: 06/07/24  9:03 PM   Specimen: Nasal Mucosa; Nasal Swab  Result Value Ref Range Status   MRSA by PCR Next Gen NOT DETECTED NOT DETECTED Final    Comment: (NOTE) The GeneXpert MRSA Assay (FDA approved for NASAL specimens only), is one component of a comprehensive MRSA colonization surveillance program. It is not intended to diagnose MRSA infection nor to guide or monitor treatment for MRSA infections. Test performance is not FDA approved in patients less than 88 years old. Performed at [facility], [address], [place], [place] [address]   Urine Culture     Status: Abnormal    Collection Time: 06/08/24  6:00 AM   Specimen: Urine, Random  Result Value Ref Range Status   Specimen Description   Final    URINE, RANDOM Performed at [facility], [address] [place], [place] [address]    Special Requests   Final    NONE Performed at [facility], [address] [place], [place] [address]    Culture >=100,000 COLONIES/mL ESCHERICHIA COLI (A)  Final   Report Status 06/10/2024 FINAL  Final   Organism ID, Bacteria ESCHERICHIA COLI (A)  Final      Susceptibility   Escherichia coli - MIC*    AMPICILLIN 8 SENSITIVE Sensitive     CEFAZOLIN  (URINE) Value in next row Sensitive      <=1 SENSITIVEThis is a modified FDA-approved test that  has been validated and its performance characteristics determined by the reporting laboratory.  This laboratory is certified under the Clinical Laboratory Improvement Amendments CLIA as qualified to perform high complexity clinical laboratory testing.    CEFEPIME Value in next row Sensitive      <=1 SENSITIVEThis is a modified FDA-approved test that has been validated and its performance characteristics determined by the reporting laboratory.  This laboratory is certified under the Clinical Laboratory Improvement Amendments CLIA as qualified to perform high complexity clinical laboratory testing.    ERTAPENEM Value in next row Sensitive      <=1 SENSITIVEThis is a modified FDA-approved test that has been validated and its performance characteristics determined by the reporting laboratory.  This laboratory is certified under the Clinical Laboratory Improvement Amendments CLIA as qualified to perform high complexity clinical laboratory testing.    CEFTRIAXONE Value in next row Sensitive      <=1 SENSITIVEThis is a modified FDA-approved test that has been validated and its performance characteristics determined by the reporting laboratory.  This laboratory is certified under the Clinical Laboratory Improvement Amendments CLIA as qualified to  perform high complexity clinical laboratory testing.    CIPROFLOXACIN  Value in next row Sensitive      <=1 SENSITIVEThis is a modified FDA-approved test that has been validated and its performance characteristics determined by the reporting laboratory.  This laboratory is certified under the Clinical Laboratory Improvement Amendments CLIA as qualified to perform high complexity clinical laboratory testing.    GENTAMICIN Value in next row Sensitive      <=1 SENSITIVEThis is a modified FDA-approved test that has been validated and its performance characteristics determined by the reporting laboratory.  This laboratory is certified under the Clinical Laboratory Improvement Amendments CLIA as qualified to perform high complexity clinical laboratory testing.    NITROFURANTOIN  Value in next row Sensitive      <=1 SENSITIVEThis is a modified FDA-approved test that has been validated and its performance characteristics determined by the reporting laboratory.  This laboratory is certified under the Clinical Laboratory Improvement Amendments CLIA as qualified to perform high complexity clinical laboratory testing.    TRIMETH/SULFA Value in next row Sensitive      <=1 SENSITIVEThis is a modified FDA-approved test that has been validated and its performance characteristics determined by the reporting laboratory.  This laboratory is certified under the Clinical Laboratory Improvement Amendments CLIA as qualified to perform high complexity clinical laboratory testing.    AMPICILLIN/SULBACTAM Value in next row Sensitive      <=1 SENSITIVEThis is a modified FDA-approved test that has been validated and its performance characteristics determined by the reporting laboratory.  This laboratory is certified under the Clinical Laboratory Improvement Amendments CLIA as qualified to perform high complexity clinical laboratory testing.    PIP/TAZO Value in next row Sensitive      <=4 SENSITIVEThis is a modified FDA-approved test  that has been validated and its performance characteristics determined by the reporting laboratory.  This laboratory is certified under the Clinical Laboratory Improvement Amendments CLIA as qualified to perform high complexity clinical laboratory testing.    MEROPENEM Value in next row Sensitive      <=4 SENSITIVEThis is a modified FDA-approved test that has been validated and its performance characteristics determined by the reporting laboratory.  This laboratory is certified under the Clinical Laboratory Improvement Amendments CLIA as qualified to perform high complexity clinical laboratory testing.    * >=100,000 COLONIES/mL ESCHERICHIA COLI     Scheduled Meds:  aspirin  EC  81 mg Oral QHS   bisoprolol   5 mg Oral Daily   Chlorhexidine  Gluconate Cloth  6 each Topical Q0600   isosorbide  mononitrate  30 mg Oral BID   levothyroxine   100 mcg Oral Q0600   PARoxetine   40 mg Oral QHS   rosuvastatin   20 mg Oral q AM   Continuous Infusions:  heparin      nitroGLYCERIN  Stopped (06/08/24 1037)     Rendall Carwin, MD [facility] 06/10/2024, 4:01 PM

## 2024-06-11 DIAGNOSIS — N179 Acute kidney failure, unspecified: Secondary | ICD-10-CM | POA: Diagnosis not present

## 2024-06-11 LAB — RENAL FUNCTION PANEL
Albumin: 3.7 g/dL (ref 3.5–5.0)
Anion gap: 12 (ref 5–15)
BUN: 15 mg/dL (ref 8–23)
CO2: 22 mmol/L (ref 22–32)
Calcium: 9.3 mg/dL (ref 8.9–10.3)
Chloride: 104 mmol/L (ref 98–111)
Creatinine, Ser: 2.42 mg/dL — ABNORMAL HIGH (ref 0.44–1.00)
GFR, Estimated: 20 mL/min — ABNORMAL LOW (ref >60)
Glucose, Bld: 107 mg/dL — ABNORMAL HIGH (ref 70–99)
Phosphorus: 3.6 mg/dL (ref 2.5–4.6)
Potassium: 3.6 mmol/L (ref 3.5–5.1)
Sodium: 138 mmol/L (ref 135–145)

## 2024-06-11 LAB — CBC
HCT: 34.2 % — ABNORMAL LOW (ref 36.0–46.0)
Hemoglobin: 11.4 g/dL — ABNORMAL LOW (ref 12.0–15.0)
MCH: 31.1 pg (ref 26.0–34.0)
MCHC: 33.3 g/dL (ref 30.0–36.0)
MCV: 93.2 fL (ref 80.0–100.0)
Platelets: 335 K/uL (ref 150–400)
RBC: 3.67 MIL/uL — ABNORMAL LOW (ref 3.87–5.11)
RDW: 14.3 % (ref 11.5–15.5)
WBC: 10 K/uL (ref 4.0–10.5)
nRBC: 0 % (ref 0.0–0.2)

## 2024-06-11 LAB — TROPONIN T, HIGH SENSITIVITY: Troponin T High Sensitivity: 269 ng/L (ref 0–19)

## 2024-06-11 MED ORDER — ROSUVASTATIN CALCIUM 20 MG PO TABS: 20.0000 mg | ORAL_TABLET | Freq: Every morning | ORAL | 3 refills | Status: DC

## 2024-06-11 MED ORDER — ISOSORBIDE MONONITRATE ER 60 MG PO TB24: 60.0000 mg | ORAL_TABLET | Freq: Two times a day (BID) | ORAL | 3 refills | Status: DC

## 2024-06-11 MED ORDER — BISOPROLOL FUMARATE 5 MG PO TABS: 2.5000 mg | ORAL_TABLET | Freq: Every day | ORAL | 3 refills | Status: AC

## 2024-06-11 MED ORDER — ASPIRIN 81 MG PO TABS: 81.0000 mg | ORAL_TABLET | Freq: Every day | ORAL | 11 refills | Status: AC

## 2024-06-11 MED ORDER — LEVOTHYROXINE SODIUM 100 MCG PO TABS: 100.0000 ug | ORAL_TABLET | Freq: Every day | ORAL | 3 refills | Status: DC

## 2024-06-11 MED ORDER — CEPHALEXIN 500 MG PO CAPS: 500.0000 mg | ORAL_CAPSULE | Freq: Two times a day (BID) | ORAL | 0 refills | Status: AC

## 2024-06-11 MED ORDER — ACETAMINOPHEN 325 MG PO TABS: 650.0000 mg | ORAL_TABLET | Freq: Four times a day (QID) | ORAL | Status: AC | PRN

## 2024-06-11 NOTE — Progress Notes (Signed)
 Patient discharged to home. She had to wait for family to get here and I had given and explained discharge information. IV access removed. Patient was able to dress herself with minimal help. Taken to car by wheel chair.

## 2024-06-11 NOTE — Discharge Summary (Signed)
 Lacey Bailey, is a 78 y.o. female  DOB 1945-10-22  MRN [id number].  Admission date:  06/07/2024  Admitting Physician  Tully FORBES Carwin, MD  Discharge Date:  06/11/2024   Primary MD  Cook, Jayce G, DO  Recommendations for primary care physician for things to follow:  1)Very Low-salt diet advised---Less than 2 gm of Sodium per day advised----ok to use Mrs DASH salt substitute instead of Salt 2)Weigh yourself daily, call if you gain more than 3 pounds in 1 day or more than 5 pounds in 1 week as your diuretic medications may need to be adjusted 3)Avoid ibuprofen/Advil/Aleve/Motrin/Goody Powders/Naproxen/BC powders/Meloxicam/Diclofenac/Indomethacin and other Nonsteroidal anti-inflammatory medications as these will make you more likely to bleed and can cause stomach ulcers, can also cause Kidney problems.  4) outpatient follow-up with cardiologist within a week advised 5) please note that there has been changes to your medications 6)Repeat BMP blood test to recheck creatinine within a week 7) repeat TSH thyroid  test in about 4 to 6 weeks with PCP  Admission Diagnosis  Elevated troponin [R79.89] AKI (acute kidney injury) [N17.9]   Discharge Diagnosis  Elevated troponin [R79.89] AKI (acute kidney injury) [N17.9]    Principal Problem:   AKI (acute kidney injury) Active Problems:   NSTEMI (non-ST elevated myocardial infarction) (HCC)   Hypothyroidism   Essential hypertension   S/P CABG (coronary artery bypass graft)      Past Medical History:  Diagnosis Date   Aortic sclerosis    CAD (coronary artery disease)    a. s/p CABG in 2003 b. multiple interventions since at outside hospitals c. DES to PLB in 11/2014 d. angioplasty to mid-RCA and mid-LCx in 10/2015 with patent LIMA-LAD and occlusion of all vein grafts e. 12/2017: occlusion of vein grafts and native LCx and RCA with patent LIMA-LAD. Re-do CABG NOT  recommended by CT surgery   Depression    Essential hypertension    History of kidney stones    Hyperlipidemia    Hypothyroidism    Sleep apnea    TIA (transient ischemic attack) ~ 2013    Past Surgical History:  Procedure Laterality Date   ABDOMINAL HYSTERECTOMY     APPENDECTOMY     BLEPHAROPLASTY Bilateral    uppers   BOTOX  INJECTION N/A 03/23/2022   Procedure: BOTOX  INJECTION- 100 units;  Surgeon: Sherrilee Belvie CROME, MD;  Location: [facility];  Service: Urology;  Laterality: N/A;   CARDIAC CATHETERIZATION  11/22/2015   CARDIAC CATHETERIZATION N/A 11/22/2015   Procedure: Left Heart Cath and Cors/Grafts Angiography;  Surgeon: Alm LELON Clay, MD;  Location: [facility] INVASIVE CV LAB;  Service: Cardiovascular;  Laterality: N/A;   CARDIAC CATHETERIZATION N/A 11/22/2015   Procedure: Coronary Stent Intervention;  Surgeon: Alm LELON Clay, MD;  Location: [facility] INVASIVE CV LAB;  Service: Cardiovascular;  Laterality: N/A;   CATARACT EXTRACTION, BILATERAL Bilateral    CESAREAN SECTION  1977   CORONARY ANGIOPLASTY WITH STENT PLACEMENT  several   last one was 11/2014:  DES to the posterolateral branch thelbert 11/20/2015  CORONARY ARTERY BYPASS GRAFT  2003   [place] ; CABG X 4   CYSTOSCOPY WITH INJECTION N/A 03/23/2022   Procedure: CYSTOSCOPY WITH INJECTION;  Surgeon: Sherrilee Belvie CROME, MD;  Location: [facility];  Service: Urology;  Laterality: N/A;   DILATION AND CURETTAGE OF UTERUS     KIDNEY STONE SURGERY     opened me up   LAPAROSCOPIC CHOLECYSTECTOMY     LEFT HEART CATH AND CORS/GRAFTS ANGIOGRAPHY N/A 01/03/2018   Procedure: LEFT HEART CATH AND CORS/GRAFTS ANGIOGRAPHY;  Surgeon: [name], Peter M, MD;  Location: [facility] INVASIVE CV LAB;  Service: Cardiovascular;  Laterality: N/A;   TONSILLECTOMY     TUBAL LIGATION       HPI  from the history and physical done on the day of admission:  Chief Complaint: Dizziness, Cough, left flank pain   HPI: Lacey Bailey is a 78 y.o. female with medical history  significant for CABG, hypertension, OSA, hypothyroidism, depression. Patient went to see her outpatient provider, with multiple complaints, she was referred to the ED.  Her complaints included dizziness, cough, fatigue, difficulty breathing, left flank pain, reduced oral intake, and overall feeling unwell over the past 1 week.  No vomiting no loose stools.  She reports chronic chest pains, her last chest pain was yesterday, described as pressure-like, she took a dose of nitroglycerin  and felt better.  She takes nitro about 2-3 times a week for same symptoms.  No chest pain today.   ED Course: Blood pressure ranging mostly from 130s to 179.  Temperature 97.  Heart rate 70s.  Respiratory rate 12-19.  O2 sat greater than 96% on room air. Troponin 641>> 598. Elevated at 2.63. COVID influenza RSV negative. TSH 20.2 proBNP 4551. EDP talked to Dr. Marlyn, recommended admission, echocardiogram, no heparin  at this time unless troponin significantly increases.  Okay to stay here at [facility].   Review of Systems: As per HPI all other systems reviewed and negative.   Hospital Course:   Assessment and Plan: 1)NSTEMI with history of CABG in 2003 with multiple PCI's-chest pain resolved with nitroglycerin .  Chronic chest pains, typically takes nitroglycerin  2-3 times a week.  Follows with Dr. Debera.  Most recent angiography 2019 showed continued patent LIMA to LAD with occlusion of all vein grafts as well as native circumflex and RCA.  Redo CABG was not recommended at that time and revascularization options are limited.  Last echo 06/2023 EF of 60 to 65%, G1DD, moderate aortic stenosis. -Troponin 641>> 598>.678>>269.  With elevated proBNP at 4551  -Cardiology consult appreciated - Transitioned from off nitro drip to p.o. isosorbide   -continue aspirin  and bisoprolol  Treated with IV heparin  for about 60 hours or so -Okay to stop IV heparin  after current heparin  bag runs out later this evening -  Twelve-lead EKG on 06/11/2024 reviewed by Dr. Cathlyn Birmingham on-call cardiologist who states that there are no acute changes and EKG is unchanged from prior EKG from 06/07/2024   2)AKI----acute kidney injury in the setting of poor oral intake and dehydration prior to admission -   creatinine on admission= 2.63 , - baseline creatinine = 1.0 (08/23/23)   ,  creatinine is now= 2.42 , -Repeat BMP with PCP or cardiologist as outpatient in about a week or so --- renally adjust medications, avoid nephrotoxic agents / dehydration  / hypotension   3)HTN--stable, continue bisoprolol   -isosorbide  adjusted   4) pansensitive E. coli UTI with left flank pain-resolved left flank pain, CT chest abdomen pelvis without contrast  negative for acute abnormality. -Treat empirically with Keflex per sensitivity report -No fevers or leukocytosis   5)Moderate aortic stenosis--- outpatient follow-up with cardiology for ongoing management   Hypothyroidism-TSH elevated at 20.2. -Free T4 WNL - Levothyroxine  increased to 100 mcg daily from 75 mcg prior to admission, - follow-up outpatient for repeat TSH in 4 to 6-weeks  Discharge Condition: Stable without chest pains and without hypoxia  Follow UP   Follow-up Information     Debera Jayson MATSU, MD. Schedule an appointment as soon as possible for a visit in 1 week(s).   Specialty: Cardiology Contact information: [address] [place] [address] [phone]         Johnson Laymon HERO, [place]. Schedule an appointment as soon as possible for a visit in 1 week(s).   Specialties: Cardiology, Cardiology Contact information: [address] [place] [address] [phone]                 Consults obtained -cardiology  Diet and Activity recommendation:  As advised  Discharge Instructions   Discharge Instructions     Call MD for:  difficulty breathing, headache or visual disturbances   Complete by: As directed    Call MD for:  persistant  dizziness or light-headedness   Complete by: As directed    Call MD for:  temperature >100.4   Complete by: As directed    Diet - low sodium heart healthy   Complete by: As directed    Discharge instructions   Complete by: As directed    1)Very Low-salt diet advised---Less than 2 gm of Sodium per day advised----ok to use Mrs DASH salt substitute instead of Salt 2)Weigh yourself daily, call if you gain more than 3 pounds in 1 day or more than 5 pounds in 1 week as your diuretic medications may need to be adjusted 3)Avoid ibuprofen/Advil/Aleve/Motrin/Goody Powders/Naproxen/BC powders/Meloxicam/Diclofenac/Indomethacin and other Nonsteroidal anti-inflammatory medications as these will make you more likely to bleed and can cause stomach ulcers, can also cause Kidney problems.  4) outpatient follow-up with cardiologist within a week advised 5) please note that there has been changes to your medications 6)Repeat BMP blood test to recheck creatinine within a week 7) repeat TSH thyroid  test in about 4 to 6 weeks with PCP   Increase activity slowly   Complete by: As directed        Discharge Medications     Allergies as of 06/11/2024   No Known Allergies      Medication List     TAKE these medications    acetaminophen  325 MG tablet Commonly known as: TYLENOL  Take 2 tablets (650 mg total) by mouth every 6 (six) hours as needed for mild pain (pain score 1-3) or fever (or Fever >/= 101). What changed:  medication strength how much to take when to take this reasons to take this   aspirin  81 MG tablet Take 1 tablet (81 mg total) by mouth daily with breakfast. What changed: when to take this   bisoprolol  5 MG tablet Commonly known as: ZEBETA  Take 0.5 tablets (2.5 mg total) by mouth daily.   cephALEXin 500 MG capsule Commonly known as: KEFLEX Take 1 capsule (500 mg total) by mouth 2 (two) times daily for 5 days.   guaiFENesin 600 MG 12 hr tablet Commonly known as: MUCINEX Take  600 mg by mouth 2 (two) times daily as needed for cough or to loosen phlegm.   isosorbide  mononitrate 60 MG 24 hr tablet Commonly known  as: IMDUR  Take 1 tablet (60 mg total) by mouth 2 (two) times daily. What changed:  medication strength how much to take   levothyroxine  100 MCG tablet Commonly known as: SYNTHROID  Take 1 tablet (100 mcg total) by mouth daily before breakfast. What changed:  medication strength how much to take how to take this when to take this additional instructions   lisinopril  5 MG tablet Commonly known as: ZESTRIL  Take 1 tablet (5 mg total) by mouth daily.   loratadine 10 MG tablet Commonly known as: CLARITIN Take 10 mg by mouth daily as needed for allergies.   Melatonin 1 MG Chew Chew 1 tablet by mouth at bedtime as needed (sleep).   nitroGLYCERIN  0.4 MG SL tablet Commonly known as: NITROSTAT  DISSOLVE 1 TABLET UNDER THE TONGUE EVERY 5 MINUTES AS NEEDED FOR CHEST PAIN. DO NOT EXCEED A TOTAL OF 3 DOSES IN 15 MINUTES. What changed: See the new instructions.   PARoxetine  40 MG tablet Commonly known as: PAXIL  Take 1 tablet (40 mg total) by mouth daily. What changed: when to take this   rosuvastatin  20 MG tablet Commonly known as: CRESTOR  Take 1 tablet (20 mg total) by mouth in the morning.   Sodium Fluoride 5000 PPM 1.1 % Gel dental gel Generic drug: sodium fluoride Place 1 Application onto teeth in the morning and at bedtime.       Major procedures and Radiology Reports - PLEASE review detailed and final reports for all details, in brief -   ECHOCARDIOGRAM COMPLETE Result Date: 06/08/2024    ECHOCARDIOGRAM REPORT   Patient Name:   BIANNEY ROCKWOOD Date of Exam: 06/08/2024 Medical Rec #:  [id number]      Height:       62.0 in Accession #:    [id number]     Weight:       218.0 lb Date of Birth:  1946/01/08      BSA:          1.983 m Patient Age:    78 years       BP:           157/62 mmHg Patient Gender: F              HR:           77 bpm. Exam  Location:  [facility] Procedure: 2D Echo, Cardiac Doppler and Color Doppler (Both Spectral and Color            Flow Doppler were utilized during procedure). Indications:    Elevated Troponin  History:        Patient has prior history of Echocardiogram examinations, most                 recent 07/15/2023. CAD, Prior CABG; Risk Factors:Sleep Apnea,                 Non-Smoker, Dyslipidemia and Hypertension.  Sonographer:    Aida Pizza RCS Referring Phys: [phone] EJIROGHENE E Namiko Pritts IMPRESSIONS  1. Left ventricular ejection fraction, by estimation, is 50 to 55%. Left ventricular ejection fraction by 3D volume is 50 %. The left ventricle has low normal function. The left ventricle demonstrates regional wall motion abnormalities (see scoring diagram/findings for description). There is moderate left ventricular hypertrophy of the basal-septal segment. Left ventricular diastolic parameters are indeterminate. Elevated left atrial pressure. There is akinesis of the left ventricular, basal inferior wall.  2. Right ventricular systolic function is normal. The right ventricular size is normal. There  is normal pulmonary artery systolic pressure. The estimated right ventricular systolic pressure is 22.9 mmHg.  3. The mitral valve is degenerative. Mild to moderate mitral valve regurgitation. No evidence of mitral stenosis.  4. The aortic valve is calcified. There is severe calcifcation of the aortic valve. There is severe thickening of the aortic valve. Aortic valve regurgitation is not visualized. Moderate aortic valve stenosis. Aortic valve area, by VTI measures 1.06 cm. Aortic valve mean gradient measures 13.0 mmHg. Aortic valve Vmax measures 2.41 m/s. Althought the mean AVG is c/w mild AS, the DI is low at 0.34 and SVI 29 both c/w moderate paradoxical low flow low gradient AS  5. The inferior vena cava is normal in size with greater than 50% respiratory variability, suggesting right atrial pressure of 3 mmHg. FINDINGS  Left  Ventricle: Left ventricular ejection fraction, by estimation, is 50 to 55%. Left ventricular ejection fraction by 3D volume is 50 %. The left ventricle has low normal function. The left ventricle demonstrates regional wall motion abnormalities. The  left ventricular internal cavity size was normal in size. There is moderate left ventricular hypertrophy of the basal-septal segment. Left ventricular diastolic parameters are indeterminate. Elevated left atrial pressure. Right Ventricle: The right ventricular size is normal. No increase in right ventricular wall thickness. Right ventricular systolic function is normal. There is normal pulmonary artery systolic pressure. The tricuspid regurgitant velocity is 2.23 m/s, and  with an assumed right atrial pressure of 3 mmHg, the estimated right ventricular systolic pressure is 22.9 mmHg. Left Atrium: Left atrial size was normal in size. Right Atrium: Right atrial size was normal in size. Pericardium: There is no evidence of pericardial effusion. Mitral Valve: The mitral valve is degenerative in appearance. There is mild calcification of the mitral valve leaflet(s). Mild mitral annular calcification. Mild to moderate mitral valve regurgitation. No evidence of mitral valve stenosis. Tricuspid Valve: The tricuspid valve is normal in structure. Tricuspid valve regurgitation is mild . No evidence of tricuspid stenosis. Aortic Valve: The aortic valve is calcified. There is severe calcifcation of the aortic valve. There is severe thickening of the aortic valve. Aortic valve regurgitation is not visualized. Moderate aortic stenosis is present. Aortic valve mean gradient measures 13.0 mmHg. Aortic valve peak gradient measures 23.2 mmHg. Aortic valve area, by VTI measures 1.06 cm. Pulmonic Valve: The pulmonic valve was normal in structure. Pulmonic valve regurgitation is trivial. No evidence of pulmonic stenosis. Aorta: The aortic root is normal in size and structure. Venous: The  inferior vena cava is normal in size with greater than 50% respiratory variability, suggesting right atrial pressure of 3 mmHg. IAS/Shunts: No atrial level shunt detected by color flow Doppler. Additional Comments: 3D was performed not requiring image post processing on an independent workstation and was normal.  LEFT VENTRICLE PLAX 2D LVIDd:         5.20 cm         Diastology LVIDs:         3.85 cm         LV e' medial:    5.22 cm/s LV PW:         1.10 cm         LV E/e' medial:  16.0 LV IVS:        1.40 cm         LV e' lateral:   5.97 cm/s LVOT diam:     2.00 cm         LV E/e' lateral: 14.0  LV SV:         58 LV SV Index:   29 LVOT Area:     3.14 cm        3D Volume EF                                LV 3D EF:    Left                                             ventricul                                             ar                                             ejection                                             fraction                                             by 3D                                             volume is                                             50 %.                                 3D Volume EF:                                3D EF:        50 %                                LV EDV:       117 ml                                LV ESV:       59 ml                                LV SV:        58 ml RIGHT VENTRICLE RV S prime:  9.32 cm/s TAPSE (M-mode): 1.7 cm LEFT ATRIUM             Index        RIGHT ATRIUM           Index LA diam:        3.50 cm 1.76 cm/m   RA Area:     14.65 cm LA Vol (A2C):   56.0 ml 28.24 ml/m  RA Volume:   35.30 ml  17.80 ml/m LA Vol (A4C):   74.3 ml 37.47 ml/m LA Biplane Vol: 67.1 ml 33.84 ml/m  AORTIC VALVE AV Area (Vmax):    1.14 cm AV Area (Vmean):   1.12 cm AV Area (VTI):     1.06 cm AV Vmax:           241.00 cm/s AV Vmean:          167.500 cm/s AV VTI:            0.550 m AV Peak Grad:      23.2 mmHg AV Mean Grad:      13.0 mmHg LVOT Vmax:         87.60  cm/s LVOT Vmean:        59.600 cm/s LVOT VTI:          0.186 m LVOT/AV VTI ratio: 0.34  AORTA Ao Root diam: 3.70 cm MITRAL VALVE                  TRICUSPID VALVE MV Area (PHT): 4.06 cm       TR Peak grad:   19.9 mmHg MV Decel Time: 187 msec       TR Vmax:        223.00 cm/s MR Peak grad:    91.4 mmHg MR Mean grad:    55.0 mmHg    SHUNTS MR Vmax:         478.00 cm/s  Systemic VTI:  0.19 m MR Vmean:        349.0 cm/s   Systemic Diam: 2.00 cm MR PISA:         2.26 cm MR PISA Eff ROA: 14 mm MR PISA Radius:  0.60 cm MV E velocity: 83.30 cm/s MV A velocity: 74.30 cm/s MV E/A ratio:  1.12 Wilbert Bihari MD Electronically signed by Wilbert Bihari MD Signature Date/Time: 06/08/2024/3:14:53 PM    Final    CT CHEST ABDOMEN PELVIS WO CONTRAST Result Date: 06/07/2024 CLINICAL DATA:  Unintended weight loss.  Fatigue.  Left flank pain. EXAM: CT CHEST, ABDOMEN AND PELVIS WITHOUT CONTRAST TECHNIQUE: Multidetector CT imaging of the chest, abdomen and pelvis was performed following the standard protocol without IV contrast. RADIATION DOSE REDUCTION: This exam was performed according to the departmental dose-optimization program which includes automated exposure control, adjustment of the mA and/or kV according to patient size and/or use of iterative reconstruction technique. COMPARISON:  None Available. FINDINGS: CT CHEST FINDINGS Cardiovascular: Diffuse coronary artery and aortic atherosclerosis. Prior CABG. Heart is normal size. Aorta is normal caliber. Mediastinum/Nodes: No mediastinal, hilar, or axillary adenopathy. Trachea and esophagus are unremarkable. Thyroid  unremarkable. Lungs/Pleura: No confluent airspace opacities or effusions. Musculoskeletal: Chest wall soft tissues are unremarkable. No acute bony abnormality. CT ABDOMEN PELVIS FINDINGS Hepatobiliary: No focal liver abnormality is seen. Status post cholecystectomy. No biliary dilatation. Pancreas: No focal abnormality or ductal dilatation. Spleen: No focal  abnormality.  Normal size. Adrenals/Urinary Tract: No adrenal abnormality. No focal renal abnormality. No stones or hydronephrosis. Urinary bladder is unremarkable. Stomach/Bowel:  Stomach, large and small bowel grossly unremarkable. Vascular/Lymphatic: Diffuse aortoiliac atherosclerosis. No evidence of aneurysm or adenopathy. Reproductive: Prior hysterectomy.  No adnexal masses. Other: No free fluid or free air. Musculoskeletal: No acute bony abnormality. IMPRESSION: No acute findings in the chest, abdomen or pelvis. Diffuse aortoiliac atherosclerosis. Coronary artery disease, status post CABG. Electronically Signed   By: Franky Crease M.D.   On: 06/07/2024 18:43   Micro Results   Recent Results (from the past 240 hours)  Resp panel by RT-PCR (RSV, Flu A&B, Covid) Anterior Nasal Swab     Status: None   Collection Time: 06/07/24  7:28 PM   Specimen: Anterior Nasal Swab  Result Value Ref Range Status   SARS Coronavirus 2 by RT PCR NEGATIVE NEGATIVE Final    Comment: (NOTE) SARS-CoV-2 target nucleic acids are NOT DETECTED.  The SARS-CoV-2 RNA is generally detectable in upper respiratory specimens during the acute phase of infection. The lowest concentration of SARS-CoV-2 viral copies this assay can detect is 138 copies/mL. A negative result does not preclude SARS-Cov-2 infection and should not be used as the sole basis for treatment or other patient management decisions. A negative result may occur with  improper specimen collection/handling, submission of specimen other than nasopharyngeal swab, presence of viral mutation(s) within the areas targeted by this assay, and inadequate number of viral copies(<138 copies/mL). A negative result must be combined with clinical observations, patient history, and epidemiological information. The expected result is Negative.  Fact Sheet for Patients:  BloggerCourse.com  Fact Sheet for Healthcare Providers:   SeriousBroker.it  This test is no t yet approved or cleared by the [place]  FDA and  has been authorized for detection and/or diagnosis of SARS-CoV-2 by FDA under an Emergency Use Authorization (EUA). This EUA will remain  in effect (meaning this test can be used) for the duration of the COVID-19 declaration under Section 564(b)(1) of the Act, 21 U.S.C.section 360bbb-3(b)(1), unless the authorization is terminated  or revoked sooner.       Influenza A by PCR NEGATIVE NEGATIVE Final   Influenza B by PCR NEGATIVE NEGATIVE Final    Comment: (NOTE) The Xpert Xpress SARS-CoV-2/FLU/RSV plus assay is intended as an aid in the diagnosis of influenza from Nasopharyngeal swab specimens and should not be used as a sole basis for treatment. Nasal washings and aspirates are unacceptable for Xpert Xpress SARS-CoV-2/FLU/RSV testing.  Fact Sheet for Patients: BloggerCourse.com  Fact Sheet for Healthcare Providers: SeriousBroker.it  This test is not yet approved or cleared by the [place]  FDA and has been authorized for detection and/or diagnosis of SARS-CoV-2 by FDA under an Emergency Use Authorization (EUA). This EUA will remain in effect (meaning this test can be used) for the duration of the COVID-19 declaration under Section 564(b)(1) of the Act, 21 U.S.C. section 360bbb-3(b)(1), unless the authorization is terminated or revoked.     Resp Syncytial Virus by PCR NEGATIVE NEGATIVE Final    Comment: (NOTE) Fact Sheet for Patients: BloggerCourse.com  Fact Sheet for Healthcare Providers: SeriousBroker.it  This test is not yet approved or cleared by the [place]  FDA and has been authorized for detection and/or diagnosis of SARS-CoV-2 by FDA under an Emergency Use Authorization (EUA). This EUA will remain in effect (meaning this test can be used) for  the duration of the COVID-19 declaration under Section 564(b)(1) of the Act, 21 U.S.C. section 360bbb-3(b)(1), unless the authorization is terminated or revoked.  Performed at [facility], [address], [place], [place] [address]  MRSA Next Gen by PCR, Nasal     Status: None   Collection Time: 06/07/24  9:03 PM   Specimen: Nasal Mucosa; Nasal Swab  Result Value Ref Range Status   MRSA by PCR Next Gen NOT DETECTED NOT DETECTED Final    Comment: (NOTE) The GeneXpert MRSA Assay (FDA approved for NASAL specimens only), is one component of a comprehensive MRSA colonization surveillance program. It is not intended to diagnose MRSA infection nor to guide or monitor treatment for MRSA infections. Test performance is not FDA approved in patients less than 68 years old. Performed at [facility], [address] [place], [place] [address]   Urine Culture     Status: Abnormal   Collection Time: 06/08/24  6:00 AM   Specimen: Urine, Random  Result Value Ref Range Status   Specimen Description   Final    URINE, RANDOM Performed at [facility], [address], [place], [place] [address]    Special Requests   Final    NONE Performed at [facility], [address], [place], [place] [address]    Culture >=100,000 COLONIES/mL ESCHERICHIA COLI (A)  Final   Report Status 06/10/2024 FINAL  Final   Organism ID, Bacteria ESCHERICHIA COLI (A)  Final      Susceptibility   Escherichia coli - MIC*    AMPICILLIN 8 SENSITIVE Sensitive     CEFAZOLIN  (URINE) Value in next row Sensitive      <=1 SENSITIVEThis is a modified FDA-approved test that has been validated and its performance characteristics determined by the reporting laboratory.  This laboratory is certified under the Clinical Laboratory Improvement Amendments CLIA as qualified to perform high complexity clinical laboratory testing.    CEFEPIME Value in next row Sensitive      <=1 SENSITIVEThis is a modified FDA-approved test that has been  validated and its performance characteristics determined by the reporting laboratory.  This laboratory is certified under the Clinical Laboratory Improvement Amendments CLIA as qualified to perform high complexity clinical laboratory testing.    ERTAPENEM Value in next row Sensitive      <=1 SENSITIVEThis is a modified FDA-approved test that has been validated and its performance characteristics determined by the reporting laboratory.  This laboratory is certified under the Clinical Laboratory Improvement Amendments CLIA as qualified to perform high complexity clinical laboratory testing.    CEFTRIAXONE Value in next row Sensitive      <=1 SENSITIVEThis is a modified FDA-approved test that has been validated and its performance characteristics determined by the reporting laboratory.  This laboratory is certified under the Clinical Laboratory Improvement Amendments CLIA as qualified to perform high complexity clinical laboratory testing.    CIPROFLOXACIN  Value in next row Sensitive      <=1 SENSITIVEThis is a modified FDA-approved test that has been validated and its performance characteristics determined by the reporting laboratory.  This laboratory is certified under the Clinical Laboratory Improvement Amendments CLIA as qualified to perform high complexity clinical laboratory testing.    GENTAMICIN Value in next row Sensitive      <=1 SENSITIVEThis is a modified FDA-approved test that has been validated and its performance characteristics determined by the reporting laboratory.  This laboratory is certified under the Clinical Laboratory Improvement Amendments CLIA as qualified to perform high complexity clinical laboratory testing.    NITROFURANTOIN  Value in next row Sensitive      <=1 SENSITIVEThis is a modified FDA-approved test that has been validated and its performance characteristics determined by the reporting  laboratory.  This laboratory is certified under the Clinical Laboratory Improvement  Amendments CLIA as qualified to perform high complexity clinical laboratory testing.    TRIMETH/SULFA Value in next row Sensitive      <=1 SENSITIVEThis is a modified FDA-approved test that has been validated and its performance characteristics determined by the reporting laboratory.  This laboratory is certified under the Clinical Laboratory Improvement Amendments CLIA as qualified to perform high complexity clinical laboratory testing.    AMPICILLIN/SULBACTAM Value in next row Sensitive      <=1 SENSITIVEThis is a modified FDA-approved test that has been validated and its performance characteristics determined by the reporting laboratory.  This laboratory is certified under the Clinical Laboratory Improvement Amendments CLIA as qualified to perform high complexity clinical laboratory testing.    PIP/TAZO Value in next row Sensitive      <=4 SENSITIVEThis is a modified FDA-approved test that has been validated and its performance characteristics determined by the reporting laboratory.  This laboratory is certified under the Clinical Laboratory Improvement Amendments CLIA as qualified to perform high complexity clinical laboratory testing.    MEROPENEM Value in next row Sensitive      <=4 SENSITIVEThis is a modified FDA-approved test that has been validated and its performance characteristics determined by the reporting laboratory.  This laboratory is certified under the Clinical Laboratory Improvement Amendments CLIA as qualified to perform high complexity clinical laboratory testing.    * >=100,000 COLONIES/mL ESCHERICHIA COLI    Today   Subjective    Anthony Roland today has no new complaints No fever  Or chills   No Nausea, Vomiting or Diarrhea - Ambulatory in hallways, no chest pains no palpitations no dizziness - O2 sats with ambulation and post ambulation 95 to 97% on room air      Patient has been seen and examined prior to discharge   Objective   Blood pressure (!) 159/91, pulse  60, temperature 98.3 F (36.8 C), temperature source Oral, resp. rate 15, height 5' 2 (1.575 m), weight 98.9 kg, SpO2 96%.   Intake/Output Summary (Last 24 hours) at 06/11/2024 1109 Last data filed at 06/11/2024 0806 Gross per 24 hour  Intake 320 ml  Output 1275 ml  Net -955 ml   Exam Gen:- Awake Alert, no acute distress  HEENT:- [place].AT, No sclera icterus Neck-Supple Neck,No JVD,.  Lungs-  CTAB , good air movement bilaterally CV- S1, S2 normal, regular, prior healed sternotomy scar  Abd-  +ve B.Sounds, Abd Soft, No tenderness,    Extremity/Skin:- No  edema,   good pulses Psych-affect is appropriate, oriented x3 Neuro-no new focal deficits, no tremors    Data Review   CBC w Diff:  Lab Results  Component Value Date   WBC 10.0 06/11/2024   HGB 11.4 (L) 06/11/2024   HGB 13.0 08/23/2023   HCT 34.2 (L) 06/11/2024   HCT 39.4 08/23/2023   PLT 335 06/11/2024   PLT 372 08/23/2023   LYMPHOPCT 14 06/07/2024   MONOPCT 11 06/07/2024   EOSPCT 1 06/07/2024   BASOPCT 0 06/07/2024   CMP:  Lab Results  Component Value Date   NA 138 06/11/2024   NA 142 08/23/2023   K 3.6 06/11/2024   CL 104 06/11/2024   CO2 22 06/11/2024   BUN 15 06/11/2024   BUN 13 08/23/2023   CREATININE 2.42 (H) 06/11/2024   PROT 7.9 06/07/2024   PROT 6.6 08/23/2023   ALBUMIN 3.7 06/11/2024   ALBUMIN 4.1 08/23/2023   BILITOT 1.4 (  H) 06/07/2024   BILITOT 0.7 08/23/2023   ALKPHOS 93 06/07/2024   AST 77 (H) 06/07/2024   ALT 41 06/07/2024   Total Discharge time is about 33 minutes  Rendall Carwin M.D on 06/11/2024 at 11:09 AM  Go to [email or web address] -  for contact info  [facility] - Office  [phone]

## 2024-06-11 NOTE — Discharge Instructions (Addendum)
 1)Very Low-salt diet advised---Less than 2 gm of Sodium per day advised----ok to use Mrs DASH salt substitute instead of Salt 2)Weigh yourself daily, call if you gain more than 3 pounds in 1 day or more than 5 pounds in 1 week as your diuretic medications may need to be adjusted 3)Avoid ibuprofen/Advil/Aleve/Motrin/Goody Powders/Naproxen/BC powders/Meloxicam/Diclofenac/Indomethacin and other Nonsteroidal anti-inflammatory medications as these will make you more likely to bleed and can cause stomach ulcers, can also cause Kidney problems.  4) outpatient follow-up with cardiologist within a week advised 5) please note that there has been changes to your medications 6)Repeat BMP blood test to recheck creatinine within a week 7) repeat TSH thyroid  test in about 4 to 6 weeks with PCP

## 2024-06-12 ENCOUNTER — Telehealth: Payer: Self-pay

## 2024-06-12 NOTE — Transitions of Care (Post Inpatient/ED Visit) (Signed)
   06/12/2024  Name: BOLUWATIFE MUTCHLER MRN: [id number] DOB: 1946-02-26  Today's TOC FU Call Status: Today's TOC FU Call Status:: Unsuccessful Call (1st Attempt) Unsuccessful Call (1st Attempt) Date: 06/12/24  Attempted to reach the patient regarding the most recent Inpatient/ED visit.  Follow Up Plan: Additional outreach attempts will be made to reach the patient to complete the Transitions of Care (Post Inpatient/ED visit) call.   Signature  Charmaine Bloodgood, LPN [facility] Advisor [place] [facility] You Are. We Are. One [facility] Direct Dial [phone]

## 2024-06-12 NOTE — Transitions of Care (Post Inpatient/ED Visit) (Signed)
 06/12/2024  Name: Lacey Bailey MRN: [id number] DOB: Apr 10, 1946  Today's TOC FU Call Status: Today's TOC FU Call Status:: Successful TOC FU Call Completed Unsuccessful Call (1st Attempt) Date: 06/12/24 [facility] FU Call Complete Date: 06/12/24 Patient's Name and Date of Birth confirmed.  Transition Care Management Follow-up Telephone Call Date of Discharge: 06/11/24 [facility] Type of Discharge: Inpatient Admission Primary Inpatient Discharge Diagnosis:: Acute Kidney Injury How have you been since you were released from the hospital?: Better Any questions or concerns?: No  Items Reviewed: Medications obtained,verified, and reconciled?: Yes (Medications Reviewed) Any new allergies since your discharge?: No Dietary orders reviewed?: Yes Type of Diet Ordered:: Very Low Salt Diet Do you have support at home?: Yes People in Home [RPT]: spouse  Medications Reviewed Today: Medications Reviewed Today     Reviewed by Lavelle Charmaine NOVAK, LPN (Licensed Practical Nurse) on 06/12/24 at 1138  Med List Status: <None>   Medication Order Taking? Sig Documenting Provider Last Dose Status Informant  acetaminophen  (TYLENOL ) 325 MG tablet [id number] Yes Take 2 tablets (650 mg total) by mouth every 6 (six) hours as needed for mild pain (pain score 1-3) or fever (or Fever >/= 101). Pearlean Manus, MD  Active   aspirin  81 MG tablet [id number] Yes Take 1 tablet (81 mg total) by mouth daily with breakfast. Pearlean Manus, MD  Active   bisoprolol  (ZEBETA ) 5 MG tablet [id number] Yes Take 0.5 tablets (2.5 mg total) by mouth daily. Pearlean Manus, MD  Active   cephALEXin (KEFLEX) 500 MG capsule [id number] Yes Take 1 capsule (500 mg total) by mouth 2 (two) times daily for 5 days. Pearlean Manus, MD  Active   guaiFENesin (MUCINEX) 600 MG 12 hr tablet [id number] Yes Take 600 mg by mouth 2 (two) times daily as needed for cough or to loosen phlegm. [provider]  Active Self,  Spouse/Significant Other, Pharmacy Records  isosorbide  mononitrate (IMDUR ) 60 MG 24 hr tablet [id number] Yes Take 1 tablet (60 mg total) by mouth 2 (two) times daily. Pearlean Manus, MD  Active   levothyroxine  (SYNTHROID ) 100 MCG tablet [id number] Yes Take 1 tablet (100 mcg total) by mouth daily before breakfast. Pearlean Manus, MD  Active   lisinopril  (ZESTRIL ) 5 MG tablet [id number] Yes Take 1 tablet (5 mg total) by mouth daily. Waddell Simone HERO, DO  Active Self, Spouse/Significant Other, Pharmacy Records  loratadine (CLARITIN) 10 MG tablet [id number] Yes Take 10 mg by mouth daily as needed for allergies. [provider]  Active Self, Spouse/Significant Other, Pharmacy Records  Melatonin 1 MG CHEW [id number] Yes Chew 1 tablet by mouth at bedtime as needed (sleep). [provider]  Active Self, Spouse/Significant Other, Pharmacy Records  nitroGLYCERIN  (NITROSTAT ) 0.4 MG SL tablet [id number] Yes DISSOLVE 1 TABLET UNDER THE TONGUE EVERY 5 MINUTES AS NEEDED FOR CHEST PAIN. DO NOT EXCEED A TOTAL OF 3 DOSES IN 15 MINUTES.  Patient taking differently: Place 0.4 mg under the tongue every 5 (five) minutes as needed for chest pain.   Debera Jayson MATSU, MD  Active Self, Spouse/Significant Other, Pharmacy Records  PARoxetine  (PAXIL ) 40 MG tablet [id number] Yes Take 1 tablet (40 mg total) by mouth daily.  Patient taking differently: Take 40 mg by mouth at bedtime.   Cook, Jayce G, DO  Active Self, Spouse/Significant Other, Pharmacy Records  rosuvastatin  (CRESTOR ) 20 MG tablet [id number] Yes Take 1 tablet (20 mg total) by mouth in the morning. Pearlean Manus, MD  Active   SODIUM FLUORIDE 5000  PPM 1.1 % GEL dental gel [id number] Yes Place 1 Application onto teeth in the morning and at bedtime. [provider]  Active Self, Spouse/Significant Other, Pharmacy Records            Home Care and Equipment/Supplies: Were Home Health Services Ordered?: NA Any new equipment or medical  supplies ordered?: NA  Functional Questionnaire: Do you need assistance with bathing/showering or dressing?: No Do you need assistance with meal preparation?: No Do you need assistance with eating?: No Do you have difficulty maintaining continence: No Do you need assistance with getting out of bed/getting out of a chair/moving?: No Do you have difficulty managing or taking your medications?: No  Follow up appointments reviewed: PCP Follow-up appointment confirmed?: Yes Date of PCP follow-up appointment?: 06/15/24 Follow-up Provider: Dr. Jacqulyn Ahle Specialist [facility] Follow-up appointment confirmed?: No Reason Specialist Follow-Up Not Confirmed: Patient has Specialist Provider Number and will Call for Appointment Do you need transportation to your follow-up appointment?: No Do you understand care options if your condition(s) worsen?: Yes-patient verbalized understanding    SIGNATURE Charmaine Bloodgood, LPN [facility] Advisor [place] [facility] You Are. We Are. One [facility] Direct Dial [phone]

## 2024-06-15 ENCOUNTER — Encounter: Payer: Self-pay | Admitting: Family Medicine

## 2024-06-15 ENCOUNTER — Ambulatory Visit (INDEPENDENT_AMBULATORY_CARE_PROVIDER_SITE_OTHER): Admitting: Family Medicine

## 2024-06-15 VITALS — BP 121/64 | HR 61 | Temp 98.1°F | Ht 62.0 in | Wt 217.8 lb

## 2024-06-15 DIAGNOSIS — R10A2 Flank pain, left side: Secondary | ICD-10-CM | POA: Diagnosis not present

## 2024-06-15 DIAGNOSIS — I214 Non-ST elevation (NSTEMI) myocardial infarction: Secondary | ICD-10-CM

## 2024-06-15 DIAGNOSIS — N179 Acute kidney failure, unspecified: Secondary | ICD-10-CM

## 2024-06-15 NOTE — Patient Instructions (Addendum)
 Labs next week.  Follow up in 6 weeks.

## 2024-06-17 NOTE — Assessment & Plan Note (Signed)
 Flank pain is resolved.  She is finishing antibiotic therapy for UTI.

## 2024-06-17 NOTE — Assessment & Plan Note (Signed)
 Clinically improving.  Follow-up labs have been ordered.

## 2024-06-17 NOTE — Progress Notes (Signed)
 Subjective:  Patient ID: Lacey Bailey, female    DOB: 1945-11-25  Age: 78 y.o. MRN: [id number]  CC:   Chief Complaint  Patient presents with   Follow-up    HPI:  78 year old female presents for hospital follow-up.  Patient recently admitted from 10/15 to 10/19.  She initially presented to clinic and was evaluated by me.  I sent her directly to the ER.  Hospital course, discharge summary, labs reviewed. Summary: Patient presented to the clinic with multiple complaints including decreased p.o. intake and flank pain.  She also reported dizziness and fatigue.  She denied any urinary symptoms.  She was evaluated in the ER and was subsequently admitted after she was found to have acute kidney injury.  Peak creatinine 2.63.  2.42 at the time of discharge.  Patient also had elevated troponins consistent with NSTEMI.  Cardiology was consulted.  She was treated medically.  Additionally, patient was found to have UTI.  Was treated with Keflex.  Culture grew pansensitive E. coli.  Patient presents today for follow-up.  She states that overall she is feeling significantly better.  She is compliant with her antibiotic therapy.  Her appetite and overall p.o. intake has improved.  She still has significant fatigue but this is expected.  Patient needs follow-up labs to be ordered to reassess renal function.  Patient Active Problem List   Diagnosis Date Noted   Flank pain 06/07/2024   AKI (acute kidney injury) 06/07/2024   NSTEMI (non-ST elevated myocardial infarction) (HCC) 06/07/2024   S/P CABG (coronary artery bypass graft) 06/28/2023   CAD (coronary artery disease) 06/28/2023   OSA (obstructive sleep apnea) 11/04/2020   Depression 09/03/2015   Hypothyroidism 09/03/2015   Essential hypertension 09/03/2015   Hyperlipidemia with target LDL less than 70 09/03/2015    Social Hx   Social History   Socioeconomic History   Marital status: Married    Spouse name: Not on file   Number of  children: Not on file   Years of education: Not on file   Highest education level: Not on file  Occupational History   Not on file  Tobacco Use   Smoking status: Never   Smokeless tobacco: Never  Vaping Use   Vaping status: Never Used  Substance and Sexual Activity   Alcohol use: Not Currently    Alcohol/week: 0.0 standard drinks of alcohol    Comment: 11/22/2015 nothing since ~ 2005; occasionally had a drink w/dinner before 2005   Drug use: No   Sexual activity: Not Currently  Other Topics Concern   Not on file  Social History Narrative   Not on file   Social Drivers of Health   Financial Resource Strain: Not on file  Food Insecurity: No Food Insecurity (06/07/2024)   Hunger Vital Sign    Worried About Running Out of Food in the Last Year: Never true    Ran Out of Food in the Last Year: Never true  Transportation Needs: No Transportation Needs (06/07/2024)   PRAPARE - Administrator, Civil Service (Medical): No    Lack of Transportation (Non-Medical): No  Physical Activity: Not on file  Stress: Not on file  Social Connections: Moderately Integrated (06/07/2024)   Social Connection and Isolation Panel    Frequency of Communication with Friends and Family: More than three times a week    Frequency of Social Gatherings with Friends and Family: Once a week    Attends Religious Services: 1 to 4 times per  year    Active Member of Clubs or Organizations: No    Attends Banker Meetings: Never    Marital Status: Married    Review of Systems Per HPI  Objective:  BP 121/64   Pulse 61   Temp 98.1 F (36.7 C) (Temporal)   Ht 5' 2 (1.575 m)   Wt 217 lb 12.8 oz (98.8 kg)   SpO2 95%   BMI 39.84 kg/m      06/15/2024    2:20 PM 06/11/2024    8:55 AM 06/11/2024    7:40 AM  BP/Weight  Systolic BP 121 159 152  Diastolic BP 64 91 74  Wt. (Lbs) 217.8    BMI 39.84 kg/m2      Physical Exam Vitals and nursing note reviewed.  Constitutional:       General: She is not in acute distress.    Appearance: Normal appearance. She is obese.  HENT:     Head: Normocephalic and atraumatic.  Cardiovascular:     Rate and Rhythm: Normal rate and regular rhythm.  Pulmonary:     Effort: Pulmonary effort is normal.     Breath sounds: Normal breath sounds. No wheezing, rhonchi or rales.  Neurological:     Mental Status: She is alert.  Psychiatric:        Mood and Affect: Mood normal.        Behavior: Behavior normal.    Lab Results  Component Value Date   WBC 10.0 06/11/2024   HGB 11.4 (L) 06/11/2024   HCT 34.2 (L) 06/11/2024   PLT 335 06/11/2024   GLUCOSE 107 (H) 06/11/2024   CHOL 124 08/23/2023   TRIG 180 (H) 08/23/2023   HDL 36 (L) 08/23/2023   LDLCALC 58 08/23/2023   ALT 41 06/07/2024   AST 77 (H) 06/07/2024   NA 138 06/11/2024   K 3.6 06/11/2024   CL 104 06/11/2024   CREATININE 2.42 (H) 06/11/2024   BUN 15 06/11/2024   CO2 22 06/11/2024   TSH 20.200 (H) 06/07/2024   INR 0.96 01/28/2023   HGBA1C 5.8 (H) 08/23/2023     Assessment & Plan:  AKI (acute kidney injury) Assessment & Plan: Clinically improving.  Follow-up labs have been ordered.  Orders: -     Basic metabolic panel with GFR  Left flank pain Assessment & Plan: Flank pain is resolved.  She is finishing antibiotic therapy for UTI.   NSTEMI (non-ST elevated myocardial infarction) [facility]) Assessment & Plan: Stable currently.  No chest pain.  She is at her baseline as far as exertional symptoms.    Follow-up:  6 weeks  Danish Ruffins Bluford DO [facility]

## 2024-06-17 NOTE — Assessment & Plan Note (Signed)
 Stable currently.  No chest pain.  She is at her baseline as far as exertional symptoms.

## 2024-06-22 ENCOUNTER — Ambulatory Visit: Payer: Self-pay | Admitting: Family Medicine

## 2024-06-22 LAB — BASIC METABOLIC PANEL WITH GFR
BUN/Creatinine Ratio: 12 (ref 12–28)
BUN: 29 mg/dL — ABNORMAL HIGH (ref 8–27)
CO2: 20 mmol/L (ref 20–29)
Calcium: 9.8 mg/dL (ref 8.7–10.3)
Chloride: 103 mmol/L (ref 96–106)
Creatinine, Ser: 2.36 mg/dL — ABNORMAL HIGH (ref 0.57–1.00)
Glucose: 108 mg/dL — ABNORMAL HIGH (ref 70–99)
Potassium: 4.7 mmol/L (ref 3.5–5.2)
Sodium: 139 mmol/L (ref 134–144)
eGFR: 20 mL/min/1.73 — ABNORMAL LOW (ref >59)

## 2024-06-30 ENCOUNTER — Other Ambulatory Visit: Payer: Self-pay

## 2024-06-30 NOTE — Progress Notes (Signed)
 Left voicemail for pt to call back.

## 2024-07-05 ENCOUNTER — Telehealth: Payer: Self-pay

## 2024-07-05 NOTE — Telephone Encounter (Signed)
 Copied from CRM [id number]. Topic: Clinical - Lab/Test Results >> Jul 05, 2024 12:07 PM Joesph B wrote: Reason for CRM: relayed results. Patient would like to speak to a nurse. Please follow up with patient [phone].

## 2024-07-06 NOTE — Telephone Encounter (Signed)
 Left message for pt to return call.

## 2024-07-07 ENCOUNTER — Telehealth: Payer: Self-pay

## 2024-07-07 ENCOUNTER — Other Ambulatory Visit: Payer: Self-pay

## 2024-07-07 NOTE — Progress Notes (Unsigned)
 nep

## 2024-07-07 NOTE — Telephone Encounter (Signed)
 Copied from CRM #[id number]. Topic: Clinical - Lab/Test Results >> Jul 07, 2024 10:34 AM Sophia H wrote: Reason for CRM: Patient is returning missed phone call from yesterday - looks like it was regarding following up with nurse about lab results. Please reach out # [phone]

## 2024-07-10 ENCOUNTER — Other Ambulatory Visit: Payer: Self-pay

## 2024-07-10 DIAGNOSIS — R748 Abnormal levels of other serum enzymes: Secondary | ICD-10-CM

## 2024-07-10 DIAGNOSIS — N179 Acute kidney failure, unspecified: Secondary | ICD-10-CM

## 2024-07-11 NOTE — Telephone Encounter (Signed)
 Patient advised of labs.

## 2024-07-11 NOTE — Telephone Encounter (Signed)
Spoke with pt and addressed in separate encounter.

## 2024-09-06 ENCOUNTER — Encounter: Payer: Self-pay | Admitting: Family Medicine

## 2024-09-06 ENCOUNTER — Ambulatory Visit: Admitting: Family Medicine

## 2024-09-06 VITALS — BP 101/69 | HR 90 | Temp 96.6°F | Ht 62.0 in | Wt 210.0 lb

## 2024-09-06 DIAGNOSIS — I10 Essential (primary) hypertension: Secondary | ICD-10-CM

## 2024-09-06 DIAGNOSIS — N179 Acute kidney failure, unspecified: Secondary | ICD-10-CM

## 2024-09-06 DIAGNOSIS — I25118 Atherosclerotic heart disease of native coronary artery with other forms of angina pectoris: Secondary | ICD-10-CM | POA: Diagnosis not present

## 2024-09-06 DIAGNOSIS — N184 Chronic kidney disease, stage 4 (severe): Secondary | ICD-10-CM

## 2024-09-06 NOTE — Progress Notes (Signed)
 "  Subjective:  Patient ID: Lacey Bailey, female    DOB: [birth year]  Age: 79 y.o. MRN: [id number]  CC:   Chief Complaint  Patient presents with   3 month follow up     Follow up for chronic medical concerns- chest pressure when laying in down at night takes nitroglycerin  tabs to help  And right ear pain/ pressure at times w/ Phlegm in throat     HPI:  79 year old female presents for follow-up.  Needs reassessment renal function.  Had AKI and now renal function has been worse.  She has underlying chronic kidney disease.  She has left nephrology.  Patient is overall feeling fairly well.  Still has significant shortness of breath with exertion.  Has been having intermittent chest pain particularly at night.  She has severe coronary disease that is not amenable to another revascularization.  Patient Active Problem List   Diagnosis Date Noted   Morbid obesity (HCC) 09/07/2024   CKD (chronic kidney disease) stage 4, GFR 15-29 ml/min (HCC) 09/07/2024   S/P CABG (coronary artery bypass graft) 06/28/2023   CAD (coronary artery disease) 06/28/2023   OSA (obstructive sleep apnea) 11/04/2020   Depression 09/03/2015   Hypothyroidism 09/03/2015   Essential hypertension 09/03/2015   Hyperlipidemia with target LDL less than 70 09/03/2015    Social Hx   Social History   Socioeconomic History   Marital status: Married    Spouse name: Not on file   Number of children: Not on file   Years of education: Not on file   Highest education level: Not on file  Occupational History   Not on file  Tobacco Use   Smoking status: Never   Smokeless tobacco: Never  Vaping Use   Vaping status: Never Used  Substance and Sexual Activity   Alcohol use: Not Currently    Alcohol/week: 0.0 standard drinks of alcohol    Comment: 11/22/2015 nothing since ~ 2005; occasionally had a drink w/dinner before 2005   Drug use: No   Sexual activity: Not Currently  Other Topics Concern   Not on file  Social  History Narrative   Not on file   Social Drivers of Health   Tobacco Use: Low Risk (09/06/2024)   Patient History    Smoking Tobacco Use: Never    Smokeless Tobacco Use: Never    Passive Exposure: Not on file  Financial Resource Strain: Not on file  Food Insecurity: No Food Insecurity (06/07/2024)   Epic    Worried About Programme Researcher, Broadcasting/film/video in the Last Year: Never true    [employer] of Food in the Last Year: Never true  Transportation Needs: No Transportation Needs (06/07/2024)   Epic    Lack of Transportation (Medical): No    Lack of Transportation (Non-Medical): No  Physical Activity: Not on file  Stress: Not on file  Social Connections: Moderately Integrated (06/07/2024)   Social Connection and Isolation Panel    Frequency of Communication with Friends and Family: More than three times a week    Frequency of Social Gatherings with Friends and Family: Once a week    Attends Religious Services: 1 to 4 times per year    Active Member of [employer] or Organizations: No    Attends Banker Meetings: Never    Marital Status: Married  Depression (PHQ2-9): Low Risk (09/06/2024)   Depression (PHQ2-9)    PHQ-2 Score: 0  Alcohol Screen: Not on file  Housing: Low Risk (06/07/2024)  Epic    Unable to Pay for Housing in the Last Year: No    Number of Times Moved in the Last Year: 0    Homeless in the Last Year: No  Utilities: Not At Risk (06/07/2024)   Epic    Threatened with loss of utilities: No  Health Literacy: Not on file    Review of Systems Per HPI  Objective:  BP 101/69   Pulse 90   Temp (!) 96.6 F (35.9 C)   Ht 5' 2 (1.575 m)   Wt 210 lb (95.3 kg)   SpO2 98%   BMI 38.41 kg/m      09/06/2024    2:22 PM 06/15/2024    2:20 PM 06/11/2024    8:55 AM  BP/Weight  Systolic BP 101 121 159  Diastolic BP 69 64 91  Wt. (Lbs) 210 217.8   BMI 38.41 kg/m2 39.84 kg/m2     Physical Exam Vitals and nursing note reviewed.  Constitutional:      General: She is  not in acute distress.    Appearance: Normal appearance. She is obese.  HENT:     Head: Normocephalic and atraumatic.  Cardiovascular:     Rate and Rhythm: Normal rate and regular rhythm.  Pulmonary:     Effort: Pulmonary effort is normal.     Breath sounds: Normal breath sounds. No wheezing, rhonchi or rales.  Neurological:     Mental Status: She is alert.  Psychiatric:        Mood and Affect: Mood normal.        Behavior: Behavior normal.     Lab Results  Component Value Date   WBC 10.0 06/11/2024   HGB 11.4 (L) 06/11/2024   HCT 34.2 (L) 06/11/2024   PLT 335 06/11/2024   GLUCOSE 145 (H) 09/06/2024   CHOL 124 08/23/2023   TRIG 180 (H) 08/23/2023   HDL 36 (L) 08/23/2023   LDLCALC 58 08/23/2023   ALT 41 06/07/2024   AST 77 (H) 06/07/2024   NA 140 09/06/2024   K 4.9 09/06/2024   CL 105 09/06/2024   CREATININE 2.34 (H) 09/06/2024   BUN 27 09/06/2024   CO2 19 (L) 09/06/2024   TSH 20.200 (H) 06/07/2024   INR 0.96 01/28/2023   HGBA1C 5.8 (H) 08/23/2023     Assessment & Plan:  Essential hypertension Assessment & Plan: Well-controlled.  Continue bisoprolol  and lisinopril .  Orders: -     Lisinopril ; Take 1 tablet (5 mg total) by mouth daily.  Dispense: 90 tablet; Refill: 3  Morbid obesity (HCC) Assessment & Plan: Weight loss efforts limited due to underlying medical issues.  Will continue to follow.  Encouraged regular activity.   CKD (chronic kidney disease) stage 4, GFR 15-29 ml/min (HCC) Assessment & Plan: Recent worsening of renal function.  She has upcoming appointment with nephrology.  Will continue to monitor closely.  Orders: -     Basic metabolic panel with GFR  Coronary artery disease of native heart with stable angina pectoris, unspecified vessel or lesion type Assessment & Plan: At baseline.  Continue current medications.     Follow-up:  6 months  Mayte Diers Bluford DO [facility] "

## 2024-09-06 NOTE — Patient Instructions (Signed)
Lab today    Follow-up in 6 months

## 2024-09-07 ENCOUNTER — Ambulatory Visit: Payer: Self-pay | Admitting: Family Medicine

## 2024-09-07 DIAGNOSIS — N184 Chronic kidney disease, stage 4 (severe): Secondary | ICD-10-CM | POA: Insufficient documentation

## 2024-09-07 LAB — BASIC METABOLIC PANEL WITH GFR
BUN/Creatinine Ratio: 12 (ref 12–28)
BUN: 27 mg/dL (ref 8–27)
CO2: 19 mmol/L — ABNORMAL LOW (ref 20–29)
Calcium: 10.2 mg/dL (ref 8.7–10.3)
Chloride: 105 mmol/L (ref 96–106)
Creatinine, Ser: 2.34 mg/dL — ABNORMAL HIGH (ref 0.57–1.00)
Glucose: 145 mg/dL — ABNORMAL HIGH (ref 70–99)
Potassium: 4.9 mmol/L (ref 3.5–5.2)
Sodium: 140 mmol/L (ref 134–144)
eGFR: 21 mL/min/1.73 — ABNORMAL LOW

## 2024-09-07 MED ORDER — LISINOPRIL 5 MG PO TABS: 5.0000 mg | ORAL_TABLET | Freq: Every day | ORAL | 3 refills | Status: AC

## 2024-09-07 NOTE — Assessment & Plan Note (Signed)
 Recent worsening of renal function.  She has upcoming appointment with nephrology.  Will continue to monitor closely.

## 2024-09-07 NOTE — Assessment & Plan Note (Signed)
 At baseline. Continue current medications.

## 2024-09-07 NOTE — Assessment & Plan Note (Addendum)
 Weight loss efforts limited due to underlying medical issues.  Will continue to follow.  Encouraged regular activity.

## 2024-09-07 NOTE — Assessment & Plan Note (Signed)
 Well-controlled.  Continue bisoprolol  and lisinopril .

## 2024-09-07 NOTE — Progress Notes (Signed)
 Called and left voice mail

## 2024-09-11 NOTE — Progress Notes (Signed)
 Called and left a message for the patient to return the call for lab results

## 2024-09-18 ENCOUNTER — Telehealth: Payer: Self-pay

## 2024-09-18 NOTE — Telephone Encounter (Signed)
 Prescription Request  09/18/2024  LOV: Visit date not found  What is the name of the medication or equipment? rosuvastatin  (CRESTOR ) 20 MG tablet   Have you contacted your pharmacy to request a refill? Yes   Which pharmacy would you like this sent to?  [facility] [place], [place] - [address] [place] [address] Phone: [phone] Fax: [phone]    Patient notified that their request is being sent to the clinical staff for review and that they should receive a response within 2 business days.   Please advise at Mobile [phone] (mobile)

## 2024-09-18 NOTE — Telephone Encounter (Signed)
 Prescription Request  09/18/2024  LOV: Visit date not found  What is the name of the medication or equipment? isosorbide  mononitrate (IMDUR ) 60 MG 24 hr tablet   Have you contacted your pharmacy to request a refill? Yes   Which pharmacy would you like this sent to?  [facility] [place], [place] - [address] [place] [address] Phone: [phone] Fax: [phone]    Patient notified that their request is being sent to the clinical staff for review and that they should receive a response within 2 business days.   Please advise at Mobile [phone] (mobile)

## 2024-09-18 NOTE — Telephone Encounter (Signed)
 Prescription Request  09/18/2024  LOV: Visit date not found  What is the name of the medication or equipment? levothyroxine  (SYNTHROID ) 100 MCG tablet   Have you contacted your pharmacy to request a refill? Yes   Which pharmacy would you like this sent to?  [facility] [place], [place] - [address] [place] [address] Phone: [phone] Fax: [phone]    Patient notified that their request is being sent to the clinical staff for review and that they should receive a response within 2 business days.   Please advise at Mobile [phone] (mobile)

## 2024-09-19 ENCOUNTER — Other Ambulatory Visit: Payer: Self-pay

## 2024-09-19 DIAGNOSIS — E785 Hyperlipidemia, unspecified: Secondary | ICD-10-CM

## 2024-09-19 MED ORDER — ROSUVASTATIN CALCIUM 20 MG PO TABS: 20.0000 mg | ORAL_TABLET | Freq: Every morning | ORAL | 3 refills | Status: AC

## 2024-09-19 MED ORDER — LEVOTHYROXINE SODIUM 100 MCG PO TABS: 100.0000 ug | ORAL_TABLET | Freq: Every day | ORAL | 3 refills | Status: AC

## 2024-09-19 MED ORDER — ISOSORBIDE MONONITRATE ER 60 MG PO TB24: 60.0000 mg | ORAL_TABLET | Freq: Two times a day (BID) | ORAL | 3 refills | Status: AC

## 2024-09-19 NOTE — Telephone Encounter (Signed)
 Sent refill in

## 2024-09-19 NOTE — Telephone Encounter (Signed)
 Sent in refills

## 2024-09-28 ENCOUNTER — Other Ambulatory Visit (HOSPITAL_COMMUNITY): Payer: Self-pay | Admitting: Nephrology

## 2024-09-28 DIAGNOSIS — R809 Proteinuria, unspecified: Secondary | ICD-10-CM

## 2024-09-28 DIAGNOSIS — N184 Chronic kidney disease, stage 4 (severe): Secondary | ICD-10-CM

## 2024-09-28 DIAGNOSIS — D638 Anemia in other chronic diseases classified elsewhere: Secondary | ICD-10-CM

## 2024-10-16 ENCOUNTER — Other Ambulatory Visit: Admitting: Urology

## 2024-11-01 ENCOUNTER — Other Ambulatory Visit (HOSPITAL_COMMUNITY)

## 2025-03-06 ENCOUNTER — Ambulatory Visit: Admitting: Family Medicine
